# Patient Record
Sex: Female | Born: 1935
Health system: Southern US, Community
[De-identification: ages and names within clinical notes are randomized; demographics above are authoritative.]

## PROBLEM LIST (undated history)

## (undated) DIAGNOSIS — I10 Essential (primary) hypertension: Secondary | ICD-10-CM

## (undated) DIAGNOSIS — M1990 Unspecified osteoarthritis, unspecified site: Secondary | ICD-10-CM

## (undated) DIAGNOSIS — G609 Hereditary and idiopathic neuropathy, unspecified: Secondary | ICD-10-CM

## (undated) DIAGNOSIS — R7303 Prediabetes: Secondary | ICD-10-CM

## (undated) DIAGNOSIS — E785 Hyperlipidemia, unspecified: Secondary | ICD-10-CM

## (undated) DIAGNOSIS — K589 Irritable bowel syndrome without diarrhea: Secondary | ICD-10-CM

## (undated) DIAGNOSIS — R253 Fasciculation: Secondary | ICD-10-CM

## (undated) DIAGNOSIS — K219 Gastro-esophageal reflux disease without esophagitis: Secondary | ICD-10-CM

## (undated) DIAGNOSIS — K648 Other hemorrhoids: Secondary | ICD-10-CM

## (undated) HISTORY — PX: OTHER SURGICAL HISTORY: SHX169

## (undated) HISTORY — DX: Fasciculation: R25.3

## (undated) HISTORY — DX: Gastro-esophageal reflux disease without esophagitis: K21.9

## (undated) HISTORY — DX: Hyperlipidemia, unspecified: E78.5

## (undated) HISTORY — PX: COLONOSCOPY: SHX174

## (undated) HISTORY — DX: Essential (primary) hypertension: I10

## (undated) HISTORY — DX: Hereditary and idiopathic neuropathy, unspecified: G60.9

## (undated) HISTORY — DX: Irritable bowel syndrome, unspecified: K58.9

## (undated) HISTORY — DX: Prediabetes: R73.03

## (undated) HISTORY — DX: Unspecified osteoarthritis, unspecified site: M19.90

## (undated) HISTORY — PX: KNEE ARTHROSCOPY: SUR90

## (undated) HISTORY — DX: Other hemorrhoids: K64.8

---

## 2000-02-28 ENCOUNTER — Inpatient Hospital Stay (HOSPITAL_COMMUNITY): Admission: EM | Admit: 2000-02-28 | Discharge: 2000-03-02 | Payer: Self-pay | Admitting: Cardiology

## 2000-02-28 ENCOUNTER — Encounter: Payer: Self-pay | Admitting: Cardiology

## 2001-05-15 ENCOUNTER — Encounter: Payer: Self-pay | Admitting: Family Medicine

## 2001-05-15 ENCOUNTER — Ambulatory Visit (HOSPITAL_COMMUNITY): Admission: RE | Admit: 2001-05-15 | Discharge: 2001-05-15 | Payer: Self-pay | Admitting: Family Medicine

## 2001-06-20 ENCOUNTER — Ambulatory Visit (HOSPITAL_COMMUNITY): Admission: RE | Admit: 2001-06-20 | Discharge: 2001-06-20 | Payer: Self-pay | Admitting: Family Medicine

## 2001-06-20 ENCOUNTER — Encounter: Payer: Self-pay | Admitting: Family Medicine

## 2001-11-27 ENCOUNTER — Other Ambulatory Visit: Admission: RE | Admit: 2001-11-27 | Discharge: 2001-11-27 | Payer: Self-pay | Admitting: Family Medicine

## 2002-07-03 ENCOUNTER — Encounter: Payer: Self-pay | Admitting: Family Medicine

## 2002-07-03 ENCOUNTER — Ambulatory Visit (HOSPITAL_COMMUNITY): Admission: RE | Admit: 2002-07-03 | Discharge: 2002-07-03 | Payer: Self-pay | Admitting: Family Medicine

## 2002-09-20 HISTORY — PX: COLONOSCOPY: SHX174

## 2003-02-05 ENCOUNTER — Encounter: Payer: Self-pay | Admitting: Family Medicine

## 2003-02-05 ENCOUNTER — Ambulatory Visit (HOSPITAL_COMMUNITY): Admission: RE | Admit: 2003-02-05 | Discharge: 2003-02-05 | Payer: Self-pay | Admitting: Family Medicine

## 2003-03-28 ENCOUNTER — Emergency Department (HOSPITAL_COMMUNITY): Admission: EM | Admit: 2003-03-28 | Discharge: 2003-03-28 | Payer: Self-pay | Admitting: Emergency Medicine

## 2003-04-09 ENCOUNTER — Ambulatory Visit (HOSPITAL_COMMUNITY): Admission: RE | Admit: 2003-04-09 | Discharge: 2003-04-09 | Payer: Self-pay | Admitting: Internal Medicine

## 2003-04-14 ENCOUNTER — Inpatient Hospital Stay (HOSPITAL_COMMUNITY): Admission: EM | Admit: 2003-04-14 | Discharge: 2003-04-16 | Payer: Self-pay | Admitting: Emergency Medicine

## 2003-05-01 ENCOUNTER — Other Ambulatory Visit: Admission: RE | Admit: 2003-05-01 | Discharge: 2003-05-01 | Payer: Self-pay | Admitting: Obstetrics and Gynecology

## 2003-07-09 ENCOUNTER — Ambulatory Visit (HOSPITAL_COMMUNITY): Admission: RE | Admit: 2003-07-09 | Discharge: 2003-07-09 | Payer: Self-pay | Admitting: Family Medicine

## 2003-07-09 ENCOUNTER — Encounter: Payer: Self-pay | Admitting: Family Medicine

## 2003-11-28 ENCOUNTER — Encounter: Admission: RE | Admit: 2003-11-28 | Discharge: 2003-11-28 | Payer: Self-pay | Admitting: Family Medicine

## 2004-03-04 ENCOUNTER — Ambulatory Visit (HOSPITAL_COMMUNITY): Admission: RE | Admit: 2004-03-04 | Discharge: 2004-03-04 | Payer: Self-pay | Admitting: Orthopedic Surgery

## 2004-03-10 ENCOUNTER — Encounter (HOSPITAL_COMMUNITY): Admission: RE | Admit: 2004-03-10 | Discharge: 2004-04-09 | Payer: Self-pay | Admitting: Orthopedic Surgery

## 2004-04-10 ENCOUNTER — Ambulatory Visit (HOSPITAL_COMMUNITY): Admission: RE | Admit: 2004-04-10 | Discharge: 2004-04-10 | Payer: Self-pay | Admitting: Orthopedic Surgery

## 2004-07-14 ENCOUNTER — Ambulatory Visit (HOSPITAL_COMMUNITY): Admission: RE | Admit: 2004-07-14 | Discharge: 2004-07-14 | Payer: Self-pay | Admitting: Family Medicine

## 2004-09-20 HISTORY — PX: ESOPHAGOGASTRODUODENOSCOPY: SHX1529

## 2004-11-06 ENCOUNTER — Encounter: Admission: RE | Admit: 2004-11-06 | Discharge: 2004-11-06 | Payer: Self-pay | Admitting: Family Medicine

## 2004-12-16 ENCOUNTER — Ambulatory Visit: Payer: Self-pay | Admitting: Orthopedic Surgery

## 2005-02-01 ENCOUNTER — Ambulatory Visit: Payer: Self-pay | Admitting: Internal Medicine

## 2005-04-28 ENCOUNTER — Ambulatory Visit: Payer: Self-pay | Admitting: Internal Medicine

## 2005-07-16 ENCOUNTER — Ambulatory Visit (HOSPITAL_COMMUNITY): Admission: RE | Admit: 2005-07-16 | Discharge: 2005-07-16 | Payer: Self-pay | Admitting: Family Medicine

## 2005-08-04 ENCOUNTER — Ambulatory Visit (HOSPITAL_COMMUNITY): Admission: RE | Admit: 2005-08-04 | Discharge: 2005-08-04 | Payer: Self-pay | Admitting: Family Medicine

## 2005-08-18 ENCOUNTER — Ambulatory Visit: Payer: Self-pay | Admitting: Internal Medicine

## 2005-08-26 ENCOUNTER — Ambulatory Visit: Payer: Self-pay | Admitting: Internal Medicine

## 2005-08-26 ENCOUNTER — Ambulatory Visit (HOSPITAL_COMMUNITY): Admission: RE | Admit: 2005-08-26 | Discharge: 2005-08-26 | Payer: Self-pay | Admitting: Internal Medicine

## 2005-09-30 ENCOUNTER — Ambulatory Visit: Payer: Self-pay | Admitting: Internal Medicine

## 2006-03-17 ENCOUNTER — Ambulatory Visit: Payer: Self-pay | Admitting: Internal Medicine

## 2006-06-27 ENCOUNTER — Ambulatory Visit: Payer: Self-pay | Admitting: Gastroenterology

## 2006-07-18 ENCOUNTER — Encounter: Admission: RE | Admit: 2006-07-18 | Discharge: 2006-07-18 | Payer: Self-pay | Admitting: Family Medicine

## 2006-08-09 ENCOUNTER — Ambulatory Visit (HOSPITAL_COMMUNITY): Admission: RE | Admit: 2006-08-09 | Discharge: 2006-08-09 | Payer: Self-pay | Admitting: Family Medicine

## 2006-08-30 ENCOUNTER — Ambulatory Visit: Payer: Self-pay | Admitting: Gastroenterology

## 2006-12-21 ENCOUNTER — Ambulatory Visit: Payer: Self-pay | Admitting: Gastroenterology

## 2007-01-02 ENCOUNTER — Encounter: Admission: RE | Admit: 2007-01-02 | Discharge: 2007-01-02 | Payer: Self-pay | Admitting: Family Medicine

## 2007-03-16 ENCOUNTER — Ambulatory Visit: Payer: Self-pay | Admitting: Gastroenterology

## 2007-05-24 ENCOUNTER — Ambulatory Visit (HOSPITAL_COMMUNITY): Admission: RE | Admit: 2007-05-24 | Discharge: 2007-05-24 | Payer: Self-pay | Admitting: Family Medicine

## 2007-06-05 ENCOUNTER — Other Ambulatory Visit: Admission: RE | Admit: 2007-06-05 | Discharge: 2007-06-05 | Payer: Self-pay | Admitting: Obstetrics & Gynecology

## 2007-08-11 ENCOUNTER — Ambulatory Visit (HOSPITAL_COMMUNITY): Admission: RE | Admit: 2007-08-11 | Discharge: 2007-08-11 | Payer: Self-pay | Admitting: Family Medicine

## 2007-10-05 ENCOUNTER — Ambulatory Visit: Payer: Self-pay | Admitting: Gastroenterology

## 2007-10-09 ENCOUNTER — Ambulatory Visit (HOSPITAL_COMMUNITY): Admission: RE | Admit: 2007-10-09 | Discharge: 2007-10-09 | Payer: Self-pay | Admitting: Gastroenterology

## 2007-10-24 ENCOUNTER — Ambulatory Visit (HOSPITAL_COMMUNITY): Admission: RE | Admit: 2007-10-24 | Discharge: 2007-10-24 | Payer: Self-pay | Admitting: Obstetrics and Gynecology

## 2008-01-10 ENCOUNTER — Ambulatory Visit (HOSPITAL_COMMUNITY): Admission: RE | Admit: 2008-01-10 | Discharge: 2008-01-10 | Payer: Self-pay | Admitting: Family Medicine

## 2008-02-05 ENCOUNTER — Ambulatory Visit: Payer: Self-pay | Admitting: Gastroenterology

## 2008-03-27 ENCOUNTER — Ambulatory Visit (HOSPITAL_COMMUNITY): Admission: RE | Admit: 2008-03-27 | Discharge: 2008-03-27 | Payer: Self-pay | Admitting: Urology

## 2008-05-21 DIAGNOSIS — R253 Fasciculation: Secondary | ICD-10-CM

## 2008-05-21 HISTORY — DX: Fasciculation: R25.3

## 2008-05-23 ENCOUNTER — Ambulatory Visit: Payer: Self-pay | Admitting: Gastroenterology

## 2008-05-29 ENCOUNTER — Ambulatory Visit (HOSPITAL_COMMUNITY): Admission: RE | Admit: 2008-05-29 | Discharge: 2008-05-29 | Payer: Self-pay | Admitting: Gastroenterology

## 2008-07-12 ENCOUNTER — Other Ambulatory Visit: Admission: RE | Admit: 2008-07-12 | Discharge: 2008-07-12 | Payer: Self-pay | Admitting: Obstetrics and Gynecology

## 2008-07-16 ENCOUNTER — Ambulatory Visit (HOSPITAL_COMMUNITY): Admission: RE | Admit: 2008-07-16 | Discharge: 2008-07-16 | Payer: Self-pay | Admitting: Obstetrics and Gynecology

## 2008-08-12 ENCOUNTER — Ambulatory Visit (HOSPITAL_COMMUNITY): Admission: RE | Admit: 2008-08-12 | Discharge: 2008-08-12 | Payer: Self-pay | Admitting: Family Medicine

## 2008-08-13 ENCOUNTER — Ambulatory Visit: Payer: Self-pay | Admitting: Gastroenterology

## 2008-09-24 ENCOUNTER — Ambulatory Visit (HOSPITAL_COMMUNITY): Admission: RE | Admit: 2008-09-24 | Discharge: 2008-09-24 | Payer: Self-pay | Admitting: Urology

## 2008-11-08 ENCOUNTER — Encounter: Payer: Self-pay | Admitting: Obstetrics and Gynecology

## 2008-11-08 ENCOUNTER — Ambulatory Visit (HOSPITAL_COMMUNITY): Admission: RE | Admit: 2008-11-08 | Discharge: 2008-11-08 | Payer: Self-pay | Admitting: Obstetrics and Gynecology

## 2008-12-04 ENCOUNTER — Ambulatory Visit: Payer: Self-pay | Admitting: Gastroenterology

## 2008-12-04 DIAGNOSIS — K589 Irritable bowel syndrome without diarrhea: Secondary | ICD-10-CM

## 2008-12-04 DIAGNOSIS — K219 Gastro-esophageal reflux disease without esophagitis: Secondary | ICD-10-CM

## 2008-12-04 DIAGNOSIS — R1319 Other dysphagia: Secondary | ICD-10-CM

## 2008-12-04 DIAGNOSIS — R109 Unspecified abdominal pain: Secondary | ICD-10-CM | POA: Insufficient documentation

## 2008-12-04 DIAGNOSIS — R1084 Generalized abdominal pain: Secondary | ICD-10-CM

## 2009-06-20 ENCOUNTER — Encounter: Payer: Self-pay | Admitting: Gastroenterology

## 2009-07-01 DIAGNOSIS — E785 Hyperlipidemia, unspecified: Secondary | ICD-10-CM | POA: Insufficient documentation

## 2009-07-01 DIAGNOSIS — I10 Essential (primary) hypertension: Secondary | ICD-10-CM | POA: Insufficient documentation

## 2009-07-02 ENCOUNTER — Ambulatory Visit: Payer: Self-pay | Admitting: Gastroenterology

## 2009-08-13 ENCOUNTER — Ambulatory Visit (HOSPITAL_COMMUNITY): Admission: RE | Admit: 2009-08-13 | Discharge: 2009-08-13 | Payer: Self-pay | Admitting: Family Medicine

## 2009-08-27 ENCOUNTER — Ambulatory Visit (HOSPITAL_COMMUNITY): Admission: RE | Admit: 2009-08-27 | Discharge: 2009-08-27 | Payer: Self-pay | Admitting: Family Medicine

## 2009-11-12 ENCOUNTER — Other Ambulatory Visit: Admission: RE | Admit: 2009-11-12 | Discharge: 2009-11-12 | Payer: Self-pay | Admitting: Obstetrics and Gynecology

## 2009-12-24 ENCOUNTER — Ambulatory Visit: Payer: Self-pay | Admitting: Gastroenterology

## 2010-02-18 ENCOUNTER — Emergency Department (HOSPITAL_COMMUNITY): Admission: EM | Admit: 2010-02-18 | Discharge: 2010-02-18 | Payer: Self-pay | Admitting: Emergency Medicine

## 2010-06-02 ENCOUNTER — Ambulatory Visit: Payer: Self-pay | Admitting: Gastroenterology

## 2010-06-02 DIAGNOSIS — R634 Abnormal weight loss: Secondary | ICD-10-CM | POA: Insufficient documentation

## 2010-06-03 ENCOUNTER — Encounter: Payer: Self-pay | Admitting: Gastroenterology

## 2010-06-03 LAB — CONVERTED CEMR LAB
Albumin: 3.9 g/dL (ref 3.5–5.2)
BUN: 14 mg/dL (ref 6–23)
CO2: 30 meq/L (ref 19–32)
Calcium: 9.3 mg/dL (ref 8.4–10.5)
Chloride: 97 meq/L (ref 96–112)
Creatinine, Ser: 1.1 mg/dL (ref 0.40–1.20)
Hemoglobin: 12.1 g/dL (ref 12.0–15.0)
Lymphocytes Relative: 37 % (ref 12–46)
Lymphs Abs: 3.5 10*3/uL (ref 0.7–4.0)
Monocytes Absolute: 0.7 10*3/uL (ref 0.1–1.0)
Monocytes Relative: 7 % (ref 3–12)
Neutro Abs: 5.1 10*3/uL (ref 1.7–7.7)
RBC: 5.28 M/uL — ABNORMAL HIGH (ref 3.87–5.11)
TSH: 2.544 microintl units/mL (ref 0.350–4.500)
WBC: 9.5 10*3/uL (ref 4.0–10.5)

## 2010-06-04 ENCOUNTER — Encounter: Payer: Self-pay | Admitting: Gastroenterology

## 2010-06-11 ENCOUNTER — Ambulatory Visit: Payer: Self-pay | Admitting: Gastroenterology

## 2010-06-17 ENCOUNTER — Encounter (INDEPENDENT_AMBULATORY_CARE_PROVIDER_SITE_OTHER): Payer: Self-pay | Admitting: *Deleted

## 2010-06-20 DIAGNOSIS — K648 Other hemorrhoids: Secondary | ICD-10-CM

## 2010-06-20 HISTORY — DX: Other hemorrhoids: K64.8

## 2010-06-22 ENCOUNTER — Encounter: Payer: Self-pay | Admitting: Gastroenterology

## 2010-07-03 ENCOUNTER — Ambulatory Visit: Payer: Self-pay | Admitting: Gastroenterology

## 2010-07-03 ENCOUNTER — Ambulatory Visit (HOSPITAL_COMMUNITY): Admission: RE | Admit: 2010-07-03 | Discharge: 2010-07-03 | Payer: Self-pay | Admitting: Gastroenterology

## 2010-08-07 ENCOUNTER — Encounter (INDEPENDENT_AMBULATORY_CARE_PROVIDER_SITE_OTHER): Payer: Self-pay

## 2010-08-14 ENCOUNTER — Ambulatory Visit (HOSPITAL_COMMUNITY)
Admission: RE | Admit: 2010-08-14 | Discharge: 2010-08-14 | Payer: Self-pay | Source: Home / Self Care | Admitting: Family Medicine

## 2010-09-02 ENCOUNTER — Encounter: Payer: Self-pay | Admitting: Gastroenterology

## 2010-09-03 ENCOUNTER — Encounter: Payer: Self-pay | Admitting: Gastroenterology

## 2010-09-03 DIAGNOSIS — D509 Iron deficiency anemia, unspecified: Secondary | ICD-10-CM

## 2010-09-03 LAB — CONVERTED CEMR LAB
Basophils Relative: 0 % (ref 0–1)
Ferritin: 119 ng/mL (ref 10–291)
Lymphs Abs: 2.7 10*3/uL (ref 0.7–4.0)
MCHC: 31.9 g/dL (ref 30.0–36.0)
Monocytes Relative: 7 % (ref 3–12)
Neutro Abs: 6.7 10*3/uL (ref 1.7–7.7)
Neutrophils Relative %: 65 % (ref 43–77)
Platelets: 244 10*3/uL (ref 150–400)
RBC: 5.04 M/uL (ref 3.87–5.11)
WBC: 10.4 10*3/uL (ref 4.0–10.5)

## 2010-09-04 ENCOUNTER — Encounter (INDEPENDENT_AMBULATORY_CARE_PROVIDER_SITE_OTHER): Payer: Self-pay | Admitting: *Deleted

## 2010-09-16 ENCOUNTER — Ambulatory Visit (HOSPITAL_COMMUNITY)
Admission: RE | Admit: 2010-09-16 | Discharge: 2010-09-16 | Payer: Self-pay | Source: Home / Self Care | Attending: Family Medicine | Admitting: Family Medicine

## 2010-09-22 ENCOUNTER — Ambulatory Visit
Admission: RE | Admit: 2010-09-22 | Discharge: 2010-09-22 | Payer: Self-pay | Source: Home / Self Care | Attending: Gastroenterology | Admitting: Gastroenterology

## 2010-10-22 NOTE — Letter (Signed)
Summary: Recall Office Visit  [facility]  [address]   [place], [place] [address]   Phone: [phone]  Fax: [phone]      September 04, 2010   Katherine Joseph [address] [name] [address] [name], [place]  [address] [birth year]   Dear [name],   According to our records, it is time for you to schedule a follow-up office visit with [place].   At your convenience, please call [phone] to schedule an office visit. If you have any questions, concerns, or feel that this letter is in error, we would appreciate your call.   Sincerely,    Rexene Alberts  [facility] Ph: [phone]   Fax: [phone]

## 2010-10-22 NOTE — Miscellaneous (Signed)
Summary: Orders Update  Clinical Lists Changes  Orders: Added new Test order of T-CBC w/Diff ([id number]) - Signed Added new Test order of T-Ferritin ([id number]) - Signed 

## 2010-10-22 NOTE — Assessment & Plan Note (Signed)
Summary: 2 MON F/U POST PROCEDUE/NAUSEA/LAW   Visit Type:  Follow-up Visit Primary Care Provider:  Sudie Bailey, M.D.  CC:  follow up- doing ok.  History of Present Illness: Pt here for routine follow-up after EGD and colonoscopy in October 2011 done secondary to heme + stools, has hx of IBS .Marland Kitchen Has been on omeprazole twice a day since October. see findings below. Following high fiber diet. No problems with constipation. Denies brbpr or melena. Would like to decrease omeprazole to once/day. Denies abdominal pain, N/V. Last CBC was in December, next due in 6 mos. Pt has no complaints today, doing well. [id number] was 185, today 181.   July 04, 2010:Normal terminal ileum, approximately 10 cm visualized. 2. Rare descending and sigmoid colon diverticula.  Otherwise, no     polyps, masses, inflammatory changes, or AVMs seen. 3. Small internal hemorrhoids.  Otherwise, normal retroflex view of     the rectum. 4. Normal esophagus without evidence of Barrett's mass, erosion,     ulceration, or stricture. 5. Patchy erythema with occasional erosions seen in the stomach.     Biopsies obtained via cold forceps to allow for H. pylori     gastritis. 6. Multiple small duodenal ulcers.  Current Medications (verified): 1)  Lisinopril-Hydrochlorothiazide 20-12.5 Mg Tabs (Lisinopril-Hydrochlorothiazide) .... Once Daily 2)  Lipitor 20 Mg Tabs (Atorvastatin Calcium) .... Once Daily 3)  Asa 81 Mg .... Once Daily 4)  Depakote 250 Mg Tbec (Divalproex Sodium) .... Three At Bedtime 5)  Gnp Omeprazole 20 Mg Tbec (Omeprazole) .... Once Daily 6)  Klor-Con M20 20 Meq Cr-Tabs (Potassium Chloride Crys Cr) .... Once Daily 7)  Metoprolol Tartrate 25 Mg Tabs (Metoprolol Tartrate) .... 1/2 Tablet Twice Daily 8)  Torsemide 100 Mg Tabs (Torsemide) .... 1/2 Tablet Daily 9)  Chlordiazepoxide Hcl 5 Mg Caps (Chlordiazepoxide Hcl) .... Once Daily 10)  Prednisone 5 Mg Tabs (Prednisone) .... Take As Directed  Allergies  (verified): 1)  ! * Ivp Dye  Past History:  Past Medical History: GERD Hyperlipidemia Hypertension Irritable Bowel Syndrome "FLUTTERING IN THROAT"-SEP 2009 MBS/BASW: no OPD, nl esophageal motility  EGD/DILATION 2006- 56 Fr Maloney (RMR) ALLERGY TO IVP DYE EGDx2/TCSx2(RMR, NUR) 2004: Inflammatory polyp complicated by postpolypectomy bleed  July 04, 2010:Normal terminal ileum, approximately 10 cm visualized. 2. Rare descending and sigmoid colon diverticula.  Otherwise, no     polyps, masses, inflammatory changes, or AVMs seen. 3. Small internal hemorrhoids.  Otherwise, normal retroflex view of     the rectum. 4. Normal esophagus without evidence of Barrett's mass, erosion,     ulceration, or stricture. 5. Patchy erythema with occasional erosions seen in the stomach.     Biopsies obtained via cold forceps to allow for H. pylori     gastritis. 6. Multiple small duodenal ulcers.  Review of Systems General:  Denies fever, chills, and anorexia. Eyes:  Denies blurring, irritation, and discharge. ENT:  Denies sore throat, hoarseness, and difficulty swallowing. CV:  Denies chest pains, angina, and syncope. Resp:  Denies dyspnea at rest and wheezing. GI:  Denies difficulty swallowing, pain on swallowing, nausea, indigestion/heartburn, abdominal pain, constipation, change in bowel habits, bloody BM's, and black BMs. GU:  Denies urinary burning and urinary frequency. MS:  Denies joint pain / LOM, joint swelling, and joint stiffness. Derm:  Denies rash, itching, and dry skin. Neuro:  Denies weakness and syncope. Psych:  Denies depression and anxiety. Endo:  Denies cold intolerance and heat intolerance. Heme:  Denies bruising, bleeding, and enlarged lymph nodes.  Vital Signs:  Patient profile:   76 year old female Height:      69 inches Weight:      181 pounds BMI:     26.83 Temp:     98.7 degrees F oral Pulse rate:   80 / minute BP sitting:   122 / 78  (left arm) Cuff size:    regular  Vitals Entered By: Hendricks Limes LPN (September 22, 2010 1:43 PM)  Physical Exam  General:  Well developed, well nourished, no acute distress. Head:  Normocephalic and atraumatic. Eyes:  sclera without icterus, conjuctiva clear Mouth:  No deformity or lesions, dentition normal. Lungs:  Clear throughout to auscultation. Heart:  Regular rate and rhythm; no murmurs, rubs,  or bruits. Abdomen:  normal bowel sounds, without guarding, without rebound, no distesion, no tenderness, no masses, and no hepatomegally or splenomegaly.   Msk:  Symmetrical with no gross deformities. Normal posture. Pulses:  Normal pulses noted. Extremities:  No clubbing, cyanosis, edema or deformities noted. Neurologic:  Alert and  oriented x4;  grossly normal neurologically. Skin:  Intact without significant lesions or rashes. Psych:  Alert and cooperative. Normal mood and affect.  Impression & Recommendations:  Problem # 1:  ABDOMINAL PAIN, GENERALIZED (ICD-59.45)  76 year old female with hx of IBS, GERD, recent EGD/TCS in Oct 2011 showing small internal hemorrhoids, gastritis, duodenitis. Has been on twice daily PPI since October. Would like to decrease to once daily. Asymptomatic currently, no dysphagia or odynophagia. wt 181, in sept was 185.   May take omeprazole once daily Return in 3 mos for reassessment, wt check CBC in 6 mos to reassess for anemia.  Orders: Est. Patient Level II ([address])  Appended Document: 2 MON F/U POST PROCEDUE/NAUSEA/LAW F/U OPV AN LABS ARE IN THE COMPUTER

## 2010-10-22 NOTE — Assessment & Plan Note (Signed)
Summary: POS FIOBT   Pt returned one iFOBT and it was positive.  Tried to call pt. Ph#: busy. Her stool tests was pos for blood. She needs a repeat TCS. West Bali MD  June 12, 2010 1:30 PM     Allergies: 1)  ! * Ivp Dye  Other Orders: Immuno-chemical Fecal Occult ([id number])  Appended Document: POS FIOBT Called pt. Pos blood detected in stool. TCS/?EGD on OCT 4, RE: heme positive stools, TRILYTE PREP.  Appended Document: POS FIOBT I called pt line was busy.Marland KitchenMarland KitchenI will call back tomorrow.  Appended Document: POS FIOBT I called pt to schedule procedure,lin still busy..I will mail letter.  Appended Document: POS FIOBT Pt called today to schedule her procedure..She is scheduled for 07/03/10@10 :45am..Instructions mailed.

## 2010-10-22 NOTE — Letter (Signed)
Summary: TCS POSS EGD ORDER  TCS POSS EGD ORDER   Imported By: Ave Filter 06/22/2010 10:33:12  _____________________________________________________________________  External Attachment:    Type:   Image     Comment:   External Document

## 2010-10-22 NOTE — Miscellaneous (Signed)
Summary: Orders Update  Clinical Lists Changes  Problems: Added new problem of UNSPECIFIED ANEMIA (ICD-285.9) Orders: Added new Test order of T-CBC w/Diff ([id number]) - Signed 

## 2010-10-22 NOTE — Letter (Signed)
Summary: Recall, Screening Colonoscopy Only  [facility]  [address]   [place], [place] [address]   Phone: [phone]  Fax: [phone]    June 17, 2010  KIMBRELY BUCKEL [address] [name] [address] [place], [place]  [address] [birth year]   Dear Ms. Coughlin,   Our records indicate it is time to schedule your colonoscopy.  However, our office has been unable to contact you by phone.   Please call our office at [phone] and ask for [facility].   Thank you,   Ave Filter   [facility] Ph: [phone]   Fax: [phone]

## 2010-10-22 NOTE — Letter (Signed)
Summary: Recall, Labs Needed  [facility]  [address]   [place], [place] [address]   Phone: [phone]  Fax: [phone]    August 07, 2010  LAYCE SPRUNG [address] [name] [address] [place], [place]  [address] Jul 24, 1935   Dear Ms. Hagan,   Our records indicate it is time to repeat your blood work.  You can take the enclosed form to the lab on or near the date indicated.  Please make note of the new location of the lab:   [address], 2nd floor   [facility]  Our office will call you within a week to ten business days with the results.  If you do not hear from [place] in 10 business days, you should call the office.  If you have any questions regarding this, call the office at [phone], and ask for the nurse.  Labs are due on 09/02/2010.   Sincerely,    Hendricks Limes LPN  [facility] Ph: [phone]   Fax: [phone]

## 2010-10-22 NOTE — Assessment & Plan Note (Signed)
Summary: IBS, WEIGHT LOSS   Visit Type:  Follow-up Visit Primary Care Provider:  Sudie Bailey, M.D.  Chief Complaint:  F/U weight loss/diarrhea.  History of Present Illness: Sometimes diarrhea and feels nauseous: none lately. Last time: AUG 2011 when watching grandchildren. Didnt feel bad but kept running to BR. No vomiting. Sometimes feels like she can't get enough to eat. Concerned because she lost 15 lbs. Milk: 1/2 glass milk, and with cereal every AM. ice Cream: no. Cheese: never unless it's on hamburger.  No BC, or Goody's. ASA 81 mg daily. No Ibuprofen or Aleve. Use hydrocodone for joint pain. Taking ALign but stopped because they are expensive and usu. #30 and she ran out. Does drink [employer]. NO PAIN. No problem w/ swallowing, heartburn/indigestion, blood in stool, or melena. U Current Medications (verified): 1)  Lisinopril-Hydrochlorothiazide 20-12.5 Mg Tabs (Lisinopril-Hydrochlorothiazide) .... Once Daily 2)  Lipitor 20 Mg Tabs (Atorvastatin Calcium) .... Once Daily 3)  Asa 81 Mg .... Once Daily 4)  Depakote 250 Mg Tbec (Divalproex Sodium) .... Three At Bedtime 5)  Gnp Omeprazole 20 Mg Tbec (Omeprazole) .... Once Daily 6)  Klor-Con M20 20 Meq Cr-Tabs (Potassium Chloride Crys Cr) .... Once Daily 7)  Metoprolol Tartrate 25 Mg Tabs (Metoprolol Tartrate) .... 1/2 Tablet Twice Daily 8)  Torsemide 100 Mg Tabs (Torsemide) .... 1/2 Tablet Daily 9)  Chlordiazepoxide Hcl 5 Mg Caps (Chlordiazepoxide Hcl) .... Once Daily  Allergies (verified): 1)  ! * Ivp Dye  Past History:  Past Medical History: Last updated: 12/24/2009 GERD Hyperlipidemia Hypertension Irritable Bowel Syndrome "FLUTTERING IN THROAT"-SEP 2009 MBS/BASW: no OPD, nl esophageal motility  EGD/DILATION 2006- 56 Fr Maloney (RMR) ALLERGY TO IVP DYE EGDx2/TCSx2(RMR, NUR) 2004: Inflammatory polyp complicated by postpolypectomy bleed  Past Surgical History: Last updated: 07/01/2009 RIGHT FOOT SURGERY  Review of Systems      JUNE 2011: CT AP w/ ivc-hepatic cysts  Vital Signs:  Patient profile:   76 year old female Height:      69 inches Weight:      185 pounds BMI:     27.42 Temp:     98.1 degrees F oral Pulse rate:   88 / minute BP sitting:   130 / 80  (left arm) Cuff size:   regular  Vitals Entered By: Cloria Spring LPN (June 02, 2010 3:59 PM)  Physical Exam  General:  Well developed, well nourished, no acute distress. Head:  Normocephalic and atraumatic. Eyes:  PERRL, no icterus. Mouth:  No deformity or lesions. Neck:  Supple; no masses. Lungs:  Clear throughout to auscultation. Heart:  Regular rate and rhythm; no murmurs. Abdomen:  Soft, nontender and nondistended. No masses, BRUITS, or hernias noted. Normal bowel sounds. Extremities:  BIL trace pedal edema.   Neurologic:  Alert and  oriented x4;  grossly normal neurologically.  Impression & Recommendations:  Problem # 1:  IBS (ICD-564.1) With intermittent diarrhea and nausea. Restart probiotics: Align or Phillip's Colon Health daily.Marland Kitchen CHECK THYROID, PROTEIN, AND BLOOD COUNT TODAY. Avoid dairy. See HO. Take Calcium 500-600 mg daily. Follow up in 2 mos.  Orders: T-Comprehensive Metabolic Panel [id number]) T-TSH [phone]) T-CBC w/Diff [phone])  Problem # 2:  WEIGHT LOSS (ICD-783.21)  Unexplained weight loss. No overt GI Sx elicited. No idication for endoscopy at this time. CHECK THYROID, PROTEIN, AND BLOOD COUNT TODAY. Appt w/ PCP SEP 26. Follow up in 2 mos.  CC: PCP  Orders: T-Comprehensive Metabolic Panel [id number]) T-TSH [id number]) T-CBC w/Diff [id number])  Patient Instructions: 1)  CHECK THYROID, PROTEIN,  AND BLOOD COUNT TODAY. 2)  Continue Align or Phillip's Colon Health daily. 3)  Avoid dairy. See HO. 4)  Take Calcium 500-600 mg daily. 5)  Follow up in 2 mos. 6)  The medication list was reviewed and reconciled.  All changed / newly prescribed medications were explained.  A complete medication list  was provided to the patient / caregiver.  Appended Document: IBS, WEIGHT LOSS 2 MONTH F/U OPV IS IN THE COMPUTER  Appended Document: Orders Update    Clinical Lists Changes  Orders: Added new Service order of Est. Patient Level V ([address]) - Signed

## 2010-10-22 NOTE — Assessment & Plan Note (Signed)
Summary: IBS,GERD   Visit Type:  Follow-up Visit Primary Care Provider:  Sudie Bailey, M.D.   Chief Complaint:  follow up and doing ok.  History of Present Illness: No question, concerns, or complaints. Doing well. Align is expensive and so s/t doesn't get them. BMs: daily. Drinks [employer] every AM.  Current Medications (verified): 1)  Lisinopril-Hydrochlorothiazide 20-12.5 Mg Tabs (Lisinopril-Hydrochlorothiazide) .... Once Daily 2)  Lipitor 20 Mg Tabs (Atorvastatin Calcium) .... Once Daily 3)  Asa 81 Mg .... Once Daily 4)  Depakote 250 Mg Tbec (Divalproex Sodium) .... Three At Bedtime 5)  Gnp Omeprazole 20 Mg Tbec (Omeprazole) .... Once Daily 6)  Klor-Con M20 20 Meq Cr-Tabs (Potassium Chloride Crys Cr) .... Once Daily 7)  Metoprolol Tartrate 25 Mg Tabs (Metoprolol Tartrate) .... Two Times A Day 8)  Torsemide 100 Mg Tabs (Torsemide) .... 1/2 Tablet Daily 9)  Chlordiazepoxide Hcl 5 Mg Caps (Chlordiazepoxide Hcl) .... Once Daily  Allergies (verified): 1)  ! * Ivp Dye  Past History:  Past Medical History: GERD Hyperlipidemia Hypertension Irritable Bowel Syndrome "FLUTTERING IN THROAT"-SEP 2009 MBS/BASW: no OPD, nl esophageal motility  EGD/DILATION 2006- 56 Fr Maloney (RMR) ALLERGY TO IVP DYE EGDx2/TCSx2(RMR, NUR) 2004: Inflammatory polyp complicated by postpolypectomy bleed  Vital Signs:  Patient profile:   76 year old female Height:      69 inches Weight:      198 pounds BMI:     29.35 Temp:     98.5 degrees F oral Pulse rate:   80 / minute BP sitting:   128 / 82  (left arm) Cuff size:   regular  Vitals Entered By: Hendricks Limes LPN (December 24, 9809 10:03 AM)  Physical Exam  General:  Well developed, well nourished, no acute distress. Head:  Normocephalic and atraumatic. Mouth:  No deformity or lesions, dentition poorl. Lungs:  Clear throughout to auscultation. Heart:  Regular rate and rhythm; no murmurs. Abdomen:  Soft, nontender and nondistended.  Normal bowel  sounds. Extremities:  trace pedal edema BIL.   Neurologic:  Alert and  oriented x4;  grossly normal neurologically.  Impression & Recommendations:  Problem # 1:  IBS (ICD-564.1) Assessment Improved  Unable to afford probiotics. Given samples: Restora #4, DA-IBS #32, and Align couponsx2. OPV in 6 mos.  CC: PCP  Orders: Est. Patient Level II ([address])  Problem # 2:  GERD (ICD-530.81) Assessment: Comment Only Continue Prilosec.

## 2010-11-16 ENCOUNTER — Other Ambulatory Visit: Payer: Self-pay | Admitting: Neurology

## 2010-11-16 DIAGNOSIS — I639 Cerebral infarction, unspecified: Secondary | ICD-10-CM

## 2010-11-16 DIAGNOSIS — R200 Anesthesia of skin: Secondary | ICD-10-CM

## 2010-11-17 ENCOUNTER — Ambulatory Visit (HOSPITAL_COMMUNITY)
Admission: RE | Admit: 2010-11-17 | Discharge: 2010-11-17 | Disposition: A | Discharge status: Home / Self Care | Payer: No Typology Code available for payment source | Source: Ambulatory Visit | Attending: Neurology | Admitting: Neurology

## 2010-11-17 DIAGNOSIS — R200 Anesthesia of skin: Secondary | ICD-10-CM

## 2010-11-17 DIAGNOSIS — I639 Cerebral infarction, unspecified: Secondary | ICD-10-CM

## 2010-11-17 DIAGNOSIS — I6529 Occlusion and stenosis of unspecified carotid artery: Secondary | ICD-10-CM | POA: Insufficient documentation

## 2010-11-17 DIAGNOSIS — R209 Unspecified disturbances of skin sensation: Secondary | ICD-10-CM | POA: Insufficient documentation

## 2010-11-24 ENCOUNTER — Ambulatory Visit
Admission: RE | Admit: 2010-11-24 | Discharge: 2010-11-24 | Disposition: A | Discharge status: Home / Self Care | Payer: No Typology Code available for payment source | Source: Ambulatory Visit | Attending: Neurology | Admitting: Neurology

## 2010-11-24 DIAGNOSIS — R200 Anesthesia of skin: Secondary | ICD-10-CM

## 2010-12-07 LAB — COMPREHENSIVE METABOLIC PANEL
ALT: 11 U/L (ref 0–35)
AST: 15 U/L (ref 0–37)
Alkaline Phosphatase: 68 U/L (ref 39–117)
CO2: 31 mEq/L (ref 19–32)
Calcium: 9 mg/dL (ref 8.4–10.5)
Chloride: 100 mEq/L (ref 96–112)
GFR calc non Af Amer: 52 mL/min — ABNORMAL LOW (ref >60)
Glucose, Bld: 103 mg/dL — ABNORMAL HIGH (ref 70–99)
Potassium: 3.3 mEq/L — ABNORMAL LOW (ref 3.5–5.1)
Sodium: 138 mEq/L (ref 135–145)
Total Bilirubin: 0.5 mg/dL (ref 0.3–1.2)

## 2010-12-07 LAB — DIFFERENTIAL
Basophils Absolute: 0.1 10*3/uL (ref 0.0–0.1)
Basophils Relative: 2 % — ABNORMAL HIGH (ref 0–1)
Eosinophils Absolute: 0.2 10*3/uL (ref 0.0–0.7)
Eosinophils Relative: 2 % (ref 0–5)
Monocytes Absolute: 0.4 10*3/uL (ref 0.1–1.0)

## 2010-12-07 LAB — POCT CARDIAC MARKERS
Myoglobin, poc: 142 ng/mL (ref 12–200)
Troponin i, poc: <0.05 ng/mL (ref 0.00–0.09)

## 2010-12-07 LAB — CBC
MCV: 73.8 fL — ABNORMAL LOW (ref 78.0–100.0)
Platelets: 210 10*3/uL (ref 150–400)
RBC: 5.03 MIL/uL (ref 3.87–5.11)
WBC: 9.2 10*3/uL (ref 4.0–10.5)

## 2010-12-24 ENCOUNTER — Encounter: Payer: Self-pay | Admitting: Gastroenterology

## 2011-01-05 LAB — CBC
HCT: 39.2 % (ref 36.0–46.0)
Hemoglobin: 12.7 g/dL (ref 12.0–15.0)
MCHC: 32.4 g/dL (ref 30.0–36.0)
MCV: 74.7 fL — ABNORMAL LOW (ref 78.0–100.0)
Platelets: 168 10*3/uL (ref 150–400)
RDW: 15.8 % — ABNORMAL HIGH (ref 11.5–15.5)

## 2011-01-05 LAB — COMPREHENSIVE METABOLIC PANEL
Albumin: 3.3 g/dL — ABNORMAL LOW (ref 3.5–5.2)
Alkaline Phosphatase: 59 U/L (ref 39–117)
BUN: 10 mg/dL (ref 6–23)
Creatinine, Ser: 0.88 mg/dL (ref 0.4–1.2)
Glucose, Bld: 79 mg/dL (ref 70–99)
Total Protein: 6.6 g/dL (ref 6.0–8.3)

## 2011-01-13 ENCOUNTER — Ambulatory Visit (INDEPENDENT_AMBULATORY_CARE_PROVIDER_SITE_OTHER): Payer: No Typology Code available for payment source | Admitting: Gastroenterology

## 2011-01-13 ENCOUNTER — Encounter: Payer: Self-pay | Admitting: Gastroenterology

## 2011-01-13 DIAGNOSIS — Z1211 Encounter for screening for malignant neoplasm of colon: Secondary | ICD-10-CM

## 2011-01-13 DIAGNOSIS — K589 Irritable bowel syndrome without diarrhea: Secondary | ICD-10-CM

## 2011-01-13 DIAGNOSIS — K219 Gastro-esophageal reflux disease without esophagitis: Secondary | ICD-10-CM

## 2011-01-13 NOTE — Progress Notes (Signed)
Cc to PCP 

## 2011-01-13 NOTE — Assessment & Plan Note (Signed)
Continue OMP. OPV in 6 mos.

## 2011-01-13 NOTE — Progress Notes (Signed)
Reminder in epic for pt to follow up in 6 months °

## 2011-01-13 NOTE — Progress Notes (Signed)
  Subjective:    Patient ID: LEYANA WHIDDEN, female    DOB: [birth year], 76 y.o.   MRN: [id number]  PCP: Dr. Sudie Bailey  HPI Having problem with arthritis and pain in posterior pelvis. Bms: 1, sometimes 2. No NVD. No abd pain. Lost 12 lbs since 2009, unintentional. Doesn't feel good secondary to pain issues. No heartburn or indigestion. No need for refills.  Past Medical History  Diagnosis Date  . GERD (gastroesophageal reflux disease)   . Hyperlipidemia   . Hypertension   . IBS (irritable bowel syndrome)       Review of Systems  per HPI otherwise all systems negative. SEP 2011: NL CBC, CMP, TSH, 185 LBS, CT ABD-HEPATIC CYSTS    Objective:   Physical Exam  Constitutional: She appears well-developed.  HENT:  Head: Normocephalic and atraumatic.  Cardiovascular: Normal rate and regular rhythm.   Pulmonary/Chest: Effort normal and breath sounds normal.  Abdominal: Soft. Bowel sounds are normal. She exhibits no distension. There is no tenderness.          Assessment & Plan:

## 2011-01-13 NOTE — Assessment & Plan Note (Signed)
Continue meds. 

## 2011-02-02 NOTE — H&P (Signed)
Katherine Joseph, LARGE              ACCOUNT NO.:  [id number]   MEDICAL RECORD NO.:  [id number]          PATIENT TYPE:  AMB   LOCATION:  DAY                           FACILITY:  [facility]   PHYSICIAN:  Tilda Burrow, M.D. DATE OF BIRTH:  08/03/1935   DATE OF ADMISSION:  11/07/2008  DATE OF DISCHARGE:  LH                              HISTORY & PHYSICAL   ADMITTING DIAGNOSES:  Postmenopausal endometrial polyp, postmenopausal  bleeding, benign ovarian cysts, stable.   HISTORY OF PRESENT ILLNESS:  This is a 76 year old African American  female who is admitted at this time for hysteroscopy and D and C  regarding endometrial thickening.  She has been followed in our office  since age 25 when in August 2004, she had an episode of postmenopausal  bleeding.  Endometrial biopsy at that time showed a benign endometrium  and no malignancy.  She had been fine for several years and had a  similar episode in 2008 with biopsy showing hemorrhagic and infarcted  smooth muscle.  Transvaginal ultrasound and CT in January 2009 showed a  small asymptomatic cyst in each ovary and evaluation of CA-125 showed a  level of 4.4 which is totally benign.  She has had a transvaginal  ultrasound also which showed an endometrial thickening of 16 mm  suggesting of an endometrial abnormality such as a polyp or protruding  fibroid.  We discussed treatment options.  Given that we have biopsied  it, we were relatively comfortable this is not malignant, but she is  frustrated by the recurrence of the bleeding.  She desires to proceed  with endometrial assessment, hysteroscopy and D and C  is therefore  scheduled.  We have also offered to proceed with laparoscopy to remove  the ovaries but given the low CA-125 and unchanged ultrasound, she is  comfortable and declines consideration of that approach.  I am  comfortable with her decision.   PAST MEDICAL HISTORY:  Positive for hypertension __managed __ by Dr.  Sudie Bailey through  [facility].   She has no allergies.   Nonsmoker and nondrinker.  Denies recreational drugs.   HEENT:  Pupils equal, round, and reactive.  NECK:  Supple.  CHEST:  Clear to auscultation.  ABDOMEN:  Nontender.  EXTERNAL GENITALIA:  Normal.  Cervix:  She had benign Pap smear, with  uterus mobile and nontender with  thickened endometrium on ultrasound.  Adnexa showed previously mentioned  cysts.  EXTREMITIES:  Grossly normal.   PLAN:  Hysteroscopy and D and C on November 07, 2008.      Tilda Burrow, M.D.  Electronically Signed     JVF/MEDQ  D:  11/06/2008  T:  11/07/2008  Job:  [id number]   cc:   [facility]

## 2011-02-02 NOTE — Op Note (Signed)
NAMECARENA, STREAM              ACCOUNT NO.:  [id number]   MEDICAL RECORD NO.:  [id number]          PATIENT TYPE:  AMB   LOCATION:  DAY                           FACILITY:  [facility]   PHYSICIAN:  Tilda Burrow, M.D. DATE OF BIRTH:  [birth year]   DATE OF PROCEDURE:  DATE OF DISCHARGE:                               OPERATIVE REPORT   PREOPERATIVE DIAGNOSES:  1. Postmenopausal bleeding.  2. Suspected endometrial polyp.   POSTOPERATIVE DIAGNOSES:  1. Postmenopausal bleeding.  2. Suspected endometrial polyp.   PROCEDURE:  Hysteroscopy, D&C removal, endometrial polyp.   SURGEON:  Tilda Burrow, MD   ASSISTANT:  None.   ANESTHESIA:  General.   COMPLICATIONS:  None.   FINDINGS:  Uterus sounding to to 9 cm, large 2-1/2 cm x 1 cm x 1 cm  endometrial polyp and smaller 1cm x 5 mm polyp both trimmed.   DETAILS OF PROCEDURE:  The patient was taken to the operating room,  prepped and draped for vaginal procedure after timeout conducted and  procedure confirmed by all parties.  Single-tooth tenaculum was used to  grasp the cervix.  After speculum inserted and the cervix was dilated to  25-French, allowing introduction of rigid 30-degree operative  hysteroscope with visualization of the endometrial cavity showing 2  large endometrial polyps both of which had broad base pedicles.  The  operative scissors were guided through the hysteroscope and we used to  snip the base of the smaller polyp, the large polyp was difficult to  access and so was better achieved by using the endometrial hysteroscopic  biopsy instrument as a cutting device.  The stalk could be disrupted.  Efforts to grasp the polyps and extract them through the hysteroscope  were unsuccessful due to the large size of the polyp and linear diameter  of the lower uterine segment.  With care, a hysteroscopic grasping  device was slid outside the hysteroscope along the endometrial cavity  until the specimen could be grasped.   The hysteroscope was then removed  and a uterine grasping forceps slid in to sufficiently grasp the tissue  beside the hysteroscopic grasper and with careful slow  traction we were able to extract the larger polyp.  Smaller polyp was  more easily removed.  Both specimens were sent to pathology.  The  uterine curettage obtained minimal additional tissue.  The patient went  to recovery room in stable condition and will be discharged home.  Sponge and needle counts correct.      Tilda Burrow, M.D.  Electronically Signed     JVF/MEDQ  D:  11/08/2008  T:  11/09/2008  Job:  [id number]   cc:   [facility]

## 2011-02-02 NOTE — Assessment & Plan Note (Signed)
NAMEMarland Kitchen  CLORA, OHMER               CHART#:  [id number]   DATE:  03/16/2007                       DOB:  02-01-1935   REFERRING PHYSICIAN:  Gareth Morgan, M.D.   PROBLEM LIST:  1. Irritable bowel syndrome with bloating, constipation, frequent      stools.  2. Gastroesophageal reflux disease.  3. Esophageal dysphagia status post dilation in 08/2005  4. Hypertension.  5. Hyperlipidemia.   ALLERGIES:  IV dye.   SUBJECTIVE:  Ms. Martie Round is a 76 year old female who presents for a  return patient visit.  She is still taking her Align and is doing pretty  good.  She gets it at [facility].  She has tried Digestive Advantage in  the past.  She is having 1 to 2 bowel movements a day and also takes in  fiber.  She is not straining when having a bowel movement.  She states  she is still fat through the belly.  She is not having any constipation  or loose stool.  She also drinks a glass of Plum-Start almost every  morning.  It contains fiber.   MEDICATIONS:  1. Align samples.  2. Lisinopril/hydrochlorothiazide 20/12.5 daily.  3. Lipitor 20 mg daily.  4. Aspirin daily.  5. Librax 1 to 3 daily.  6. Depakote ER 750 mg nightly.  7. Omeprazole 20 mg daily.  8. Lasix 40 mg daily.  9. Chloroquine 20 mEq daily.   OBJECTIVE:  Weight 200 pounds.  Height 5 foot 9 inches.  Temperature 93.  Blood pressure 118/80, pulse 76.  LUNGS:  Clear to auscultation  bilaterally. CARDIOVASCULAR:  Exam is a regular rhythm, no murmur.  ABDOMEN:  Bowel sounds present, soft, nontender, nondistended, obese.   ASSESSMENT:  Ms. Risden is a 76 year old female with irritable bowel  whose constipation and loose stools have resolved.  Her gastroesophageal  reflux disease is controlled with omeprazole 20 mg daily.  Thank you for  allowing me to see the patient in consultation.  My recommendations  follow.   RECOMMENDATIONS:  1. She can return to see me in 6 months.  She may also elect to see      Dr. Karilyn Cota in [place].  2. She should continue the Align and the Plum-Start.  3. She should continue the omeprazole for her gastroesophageal reflux      disease.       Kassie Mends, M.D.  Electronically Signed     SM/MEDQ  D:  03/17/2007  T:  03/17/2007  Job:  [id number]   cc:   Mila Homer. Sudie Bailey, M.D.

## 2011-02-02 NOTE — Assessment & Plan Note (Signed)
NAMEMarland Kitchen  Katherine Joseph, Katherine Joseph               CHART#:  [id number]   DATE:  10/05/2007                       DOB:  04/09/1935   REFERRING PHYSICIAN:  Mila Homer. Sudie Bailey, M.D.   PROBLEM LIST:  1. Irritable bowel with bloating, constipation and frequent stools.  2. Gastroesophageal reflux disease.  3. Dysphagia status post dilation in December 2006.  4. Hypertension.  5. Hyperlipidemia.  6. Allergy to IVP dye.   SUBJECTIVE:  [name] is a 76 year old female who presents as a return  patient visit.  Her abdomen has been fairly fine.  She is complaining of  an unintentional 16 pound weight loss over the last year and a half.  She says her legs are getting skinny and flabby.  She has two bowel  movements a day.  She denies any diarrhea.  She was seen for bleeding,  which sounds like it may have been vaginal or urinary.  She denies any  rectal bleeding.  She reports having an ultrasound but the results are  not available to me.  She also sounds like she had an endometrial  biopsy.  She reports that she was seeing blood in her urine when she  urinated.  She denies any cold intolerance or any night sweats.  She  does have hot flashes.  She denies anypalpitations.  She has been  complaining of some lower back pain.  The back pain can be so  significant that it causes her not to be able to sleep at nighttime.  She is able to lie on the right side better than the left.   MEDICATIONS:  1. Lisinopril/hydrochlorothiazide.  2. Lipitor.  3. Aspirin.  4. Librax.  5. Depakote.  6. Omeprazole 20 mg daily.  7. Lasix 40 mg daily.  8. Klor-Con.  9. Metoprolol.   OBJECTIVE:  Weight:  194 pounds (down 16 pounds since June 2007).  Height:  5'9.  Temperature:  98.5.  Blood pressure:  120/78.  Pulse:  88.GENERAL:  She is in no apparent distress.  Alert and oriented  x4.LUNGS:  Clear to auscultation bilaterally.  CARDIOVASCULAR:  Regular rhythm.  No murmur.ABDOMEN:  Bowel sounds are  present.  Soft, mildly  distended, nontender.  No rebound or guarding.   ASSESSMENT:  Katherine Joseph is a 76 year old female who has irritable  bowel.  The symptoms seem to be fairly well controlled with Librax and  omeprazole.  She complains of hematuria and a 16 pound weight loss  without any imaging of her genitourinary tract.   Thank you for allowing me to see Katherine Joseph in consultation.  My  recommendations follow.   RECOMMENDATIONS:  1. We will obtain a CT scan of the abdomen and pelvis with oral      contrast due to her allergy to IV dye.  I discussed this with Dr.      Tyron Russell and we should be able to see solid tumors adequately with      this mode of imaging.  2. She should continue the omeprazole and the Librax.  3. She has a follow up appointment to see me in three to four months.  4. We will call her with the results of her CT scan.       Kassie Mends, M.D.  Electronically Signed     SM/MEDQ  D:  10/05/2007  T:  10/05/2007  Job:  [id number]   cc:   Mila Homer. Sudie Bailey, M.D.

## 2011-02-02 NOTE — Assessment & Plan Note (Signed)
NAMEMarland Kitchen  MYKENNA, VIELE               CHART#:  [id number]   DATE:  02/05/2008                       DOB:  05/21/1935   REFERRING Izac Faulkenberry:  Mila Homer. Sudie Bailey, M.D.   PROBLEM LIST:  1. Irritable bowel or blood and consultation and frequent stools.  2. Gastroesophageal reflux disease.  3. Dysphagia with dilation in December 2006.  4. Hypertension.  5. Hyperlipidemia.  6. Allergy to IVP dye.   SUBJECTIVE:  Ms. Axelrod is a 76 year old female who presents as a  return patient visit.  She was last seen in January 2009.  She had the  CT scan because of her weight loss and symptoms and CT scan showed  bilateral cysts in her ovaries.  She was complaining of blood or urine  as well and was referred to Dr. Rito Ehrlich.  He said she had a small stone  which was not clinically significant.  Dr. Emelda Fear does not plan to do  any interventions for her ovaries unless she has pain or bleeding.  She  also has apparently a polyp in her uterus.  Sometimes she complains of  sensation in her throat which feels like fullness, it is usually  associated with needing to have a bowel movement.  She is having one  bowel movement a day.  She does not take the Librax every day because  she says it is too expensive.  She also occasionally takes probiotics.  She is taking the omeprazole daily.  Her heartburn and her indigestion  are controlled.   MEDICATIONS:  1. Probiotics daily.  2. Lisinopril/hydrochlorothiazide.  3. Lipitor.  4. Aspirin.  5. Librax.  6. Depakote.  7. Omeprazole.  8. Lasix.  9. Klor-Con.  10.Metoprolol.  11.Pain pills.   PHYSICAL EXAMINATION:  VITAL SIGNS:  Weight 199 pounds (up 5 pounds  since January 2009), height 5 feet 9 inches, temperature 98.4, blood  pressure 140/88, pulse 76.  GENERAL:  She is in no apparent distress.  Alert and oriented x4.  LUNGS:  Clear to auscultation bilaterally.  CARDIOVASCULAR:  Regular  rhythm.  ABDOMEN:  Bowel sounds present, soft, nontender,  nondistended.   ASSESSMENT:  Ms. Rhett is a 76 year old female whose irritable bowel  symptoms are fairly well-controlled.  Thank you for allowing me to see  Ms. Curt in consultation.  My recommendations follow.   RECOMMENDATIONS:  1. She should continue the Librax, omeprazole and  the probiotics.  2. She has follow-up appointment to see me in six months.       Kassie Mends, M.D.  Electronically Signed     SM/MEDQ  D:  02/05/2008  T:  02/05/2008  Job:  [id number]   cc:   Mila Homer. Sudie Bailey, M.D.

## 2011-02-02 NOTE — Assessment & Plan Note (Signed)
NAMEMarland Kitchen  Katherine Joseph, Katherine Joseph               CHART#:  [id number]   DATE:  08/13/2008                       DOB:  [birth year]   REFERRING PHYSICIAN:  Mila Homer. Sudie Bailey, MD   PROBLEM LIST:  1. Irritable bowel with frequent stools.  2. Gastroesophageal reflux disease.  3. Dysphagia with dilation in December 2006, which relieved her      symptoms.  4. Hypertension.  5. Hyperlipidemia.  6. Allergies to IVP dye.   SUBJECTIVE:  Katherine Joseph is a 76 year old female who presents as a  return patient visit.  She was last seen in September 2009 and was  complaining of fluttering that began in her abdomen and moved into her  throat.  She had a modified barium swallow, which showed no evidence of  oropharyngeal dysphagia and her barium swallow showed a small hiatal  hernia.  No evidence of reflux and normal esophageal motility.  She did  have cervical spondylosis.  She states that she still feels her throat  quivers, but it is better.  She feels like that happens when is time  for her to have a bowel movement.  She denies any heartburn or  indigestion.   MEDICATIONS:  1. Align.  2. Lisinopril.  3. Lipitor.  4. Aspirin.  5. Librax 1-3 daily.  6. Depakote.  7. Omeprazole daily.  8. Lasix.  9. Klor-Con.  10.Metoprolol.  11.Hydrocodone as needed.   OBJECTIVE:  VITAL SIGNS:  Weight 198 pounds (down 1 pound since  September 2009), height 5 feet 9 inches, temperature 98.3, blood  pressure 130/80, and pulse 72.  GENERAL:  She is in no apparent distress.  Alert and oriented x4.LUNGS:  Clear to auscultation bilaterally.CARDIOVASCULAR:  Regular  rhythm.ABDOMEN:  Bowel sounds are present.  Soft, nontender, and  nondistended.   ASSESSMENT:  Katherine Joseph is a 76 year old female who has a functional  gut disorder.  The quivering may be due to bowel spasm or esophageal  spasm from breakthrough or acid exposure. Thank you for allowing me to  see Katherine Joseph in consultation.  My recommendations follow.   RECOMMENDATIONS:  1. She should continue her omeprazole.  2. She may continue to use the Librax as needed.  3. Follow up appointment in 4 months.  She may see me sooner if she      feels she needs too.       Kassie Mends, M.D.  Electronically Signed     SM/MEDQ  D:  08/13/2008  T:  08/13/2008  Job:  [id number]   cc:   Mila Homer. Sudie Bailey, M.D.

## 2011-02-02 NOTE — Assessment & Plan Note (Signed)
NAMEMarland Kitchen  Katherine Joseph, Katherine Joseph               CHART#:  [id number]   DATE:  05/23/2008                       DOB:  May 17, 1935   REFERRING PHYSICIAN:  Mila Homer. Sudie Bailey, MD   PROBLEM LIST:  1. Irritable bowel and frequent stools.  2. Gastroesophageal reflux disease.  3. Dysphagia with dilation in December 2006, which relieved her      symptoms.  4. Hypertension.  5. Hyperlipidemia.  6. Allergy to IVP dye.   SUBJECTIVE:  The patient is a 76 year old female who was seen as a  return patient visit.  She was last seen in May 2009.  She states she  feels like something is in her throat.  She feels if she pushes in her  stomach she can feel the sensation in her throat.  Rubbing on her  stomach makes the sensation in her throat go away.  She states she  swallows just fine.  She denies any vomiting, nausea, heartburn,  indigestion, weight loss, or abdominal pain.  She states this is not  like in 2006 when she could not swallow meat.   MEDICATIONS:  1. Align.  2. Lisinopril.  3. Lipitor.  4. Aspirin.  5. Librax.  6. Depakote.  7. Omeprazole.  8. Furosemide.  9. Klor-Con.  10.Metoprolol.  11.Hydrocodone as needed.   OBJECTIVE:   PHYSICAL EXAMINATION:  VITAL SIGNS: Weight 198 pounds (down 1 pound  since May 2009), height 5 feet 9 inches, temperature 98.4, blood  pressure 130/80, and pulse 64.  GENERAL:  She is in no apparent distress.  Alert and oriented x4.NECK:  No thyromegaly or lymphadenopathy.  Full range of motion.LUNGS:  Clear  to auscultation bilaterally.CARDIOVASCULAR:  Regular rhythm.  No murmur.  Normal S1 and S2.ABDOMEN:  Bowel sounds are present.  Soft, nontender,  and nondistended.  No rebound or guarding.EXTREMITIES:  No cyanosis or  edema.   ASSESSMENT:  The patient is a 76 year old female who presents as a  return patient visit.  She has symptoms of globus sensation.  The  differential diagnosis includes oropharyngeal dysphagia or esophageal  motility disorder.  She  could have breakthrough gastroesophageal reflux  disease.  Thank you for allowing me to see the patient in consultation.  My recommendations are as follow.   RECOMMENDATIONS:  1. She should continue her omeprazole 20 mg daily.  2. Will order a modified barium swallow to evaluate for oropharyngeal      dysphagia and a barium swallow to evaluate for esophageal motility      disorder.  If these studies did not reveal any etiology for her      symptoms and we will do an EGD with a Bravo study.  3. She has a follow up appointment to see me in 2-3 months.       Kassie Mends, M.D.  Electronically Signed     SM/MEDQ  D:  05/23/2008  T:  05/23/2008  Job:  [id number]   cc:   Mila Homer. Sudie Bailey, M.D.

## 2011-02-05 NOTE — Op Note (Signed)
Katherine Joseph, Katherine Joseph              ACCOUNT NO.:  [id number]   MEDICAL RECORD NO.:  [id number]          PATIENT TYPE:  AMB   LOCATION:  DAY                           FACILITY:  [facility]   PHYSICIAN:  R. Roetta Sessions, M.D. DATE OF BIRTH:  06/16/1935   DATE OF PROCEDURE:  08/26/2005  DATE OF DISCHARGE:                                 OPERATIVE REPORT   PROCEDURE:  Esophagogastroduodenoscopy with Elease Hashimoto dilation.   INDICATIONS FOR PROCEDURE:  The patient is a 76 year old lady with  esophageal dysphagia in the background setting of gastroesophageal reflux  disease symptoms. She is not taking any acid suppression therapy. EGD is now  being done. This approach has been discussed with the patient at length.  Potential risks, benefits, and alternatives have been reviewed and questions  answered. She is agreeable. Please see documentation in the medical record.   PROCEDURE NOTE:  O2 saturation, blood pressure, pulse, and respirations were  monitored throughout the entire procedure. Conscious sedation with Versed 5  mg IV and Demerol 125 mg IV in divided doses. Cetacaine spray for topical  oropharyngeal anesthesia.   INSTRUMENT:  Olympus video chip system.   FINDINGS:  Examination of the tubular esophagus revealed three long linear  distal esophageal erosions. There was no Barrett's esophagus. No obvious  ring, stricture or other abnormality. EG junction easily traversed.   Stomach:  Gastric cavity was empty and insufflated well with air. Thorough  examination of gastric mucosa including retroflexed view of the proximal  stomach and esophagogastric junction demonstrated no abnormalities. Pylorus  patent and easily traversed. Examination of bulb and second portion revealed  no abnormalities.   THERAPEUTIC/DIAGNOSTIC MANEUVERS:  A 56-French Maloney dilator was passed to  full insertion with ease. A look back revealed slight rent at the EG  junction. Otherwise no apparent complication related  to passage of the  dilator.   The patient tolerated the procedure well and was reactive to endoscopy.   IMPRESSION:  Multiple long linear distal esophageal erosions consistent with  moderately severe erosive reflux esophagitis status post Maloney dilation as  described above. Otherwise normal esophagus. Normal stomach, D1 and D2.   RECOMMENDATIONS:  1.  Gastroesophageal reflux disease. Treatment literature provided to Ms.      Carroll.  2.  Begin Aciphex 20 mg orally each morning before breakfast. The patient is      to go by my office for #30 free samples. I have given her a      prescription.  3.  We will plan to see this nice lady back in the office to see how she is      doing in four to six weeks.      Jonathon Bellows, M.D.  Electronically Signed     RMR/MEDQ  D:  08/26/2005  T:  08/26/2005  Job:  [id number]   cc:   Mila Homer. Sudie Bailey, M.D.  Fax: [phone]

## 2011-02-05 NOTE — H&P (Signed)
NAMEASMAA, Katherine Joseph              ACCOUNT NO.:  [id number]   MEDICAL RECORD NO.:  [id number]          PATIENT TYPE:  AMB   LOCATION:                                FACILITY:  [facility]   PHYSICIAN:  R. Roetta Sessions, M.D. DATE OF BIRTH:  01-16-1935   DATE OF ADMISSION:  08/18/2005  DATE OF DISCHARGE:  LH                                HISTORY & PHYSICAL   CHIEF COMPLAINT:  Problem swallowing.   HISTORY OF PRESENT ILLNESS:  Katherine Joseph is a 76 year old African American  female with a history of IBS who presents today for further evaluation of  difficulty swallowing.  She was last seen on April 28, 2005.  At that time  she was doing fairly well.  She says she has had difficulty swallowing solid  foods especially meat.  She feels like it is getting stuck on the way down.  This has been progressive over the last several months.  She has also had  some intermittent heartburn symptoms.  She takes Tums on a p.r.n. basis.  Her last EGD was in July 2004.  At that time she had presented to the  hospital with a GI bleed, after a colonic polypectomy.  EGD was unremarkable  other than wavy squamocolumnar junction.  She denies any nausea or vomiting,  abdominal pain.  She continues to have intermittent bouts of increased stool  frequency related to certain foods, otherwise her stools are normal.  Denies  any melena or rectal bleeding.  She remains on Librax and Fiber Choice.   CURRENT MEDICATIONS:  1.  Zestoretic 20/12.5 mg every day.  2.  Aspirin 81 mg every day.  3.  Librax q.i.d.  4.  Lipitor 20 mg every day.  5.  Toprol XL 50 mg every day.  6.  Depakote ER 500 mg q.h.s.   ALLERGIES:  No known drug allergies.   PAST MEDICAL HISTORY:  1.  Hypertension.  2.  Hypercholesterolemia.  3.  Gastroesophageal reflux disease.  4.  IBS.  5.  She had a colonoscopy, April 09, 2003, at which time she was found to      have a 0.75-cm pedunculated polyp at the splenic flexure.  Pathology      revealed an  inflammatory polyp.  She subsequently developed a post      polypectomy bleed and had repeat colonoscopy, April 15, 2003.  The      polypectomy site was marked by a small area of raised mucosa with a tiny      ulcer with clean base.  The site was coagulated.  She also had three      tiny diverticula at the sigmoid colon.  She has been recommended to have      a follow up colonoscopy in five years, given family history of      colorectal cancer.  This would be July 2009.   PAST SURGICAL HISTORY:  She had a right knee arthroscopy for torn cartilage  by Dr. Romeo Apple within the last couple of years.   FAMILY HISTORY:  Mother died of old age.  Father  had colon cancer.   SOCIAL HISTORY:  She is widowed, has nine children.  No tobacco or alcohol  use.  She is retired.   REVIEW OF SYSTEMS:  See HPI for GI.  CONSTITUTIONAL:  No weight loss.  CARDIOPULMONARY:  No chest pain or shortness of breath.   PHYSICAL EXAMINATION:  VITAL SIGNS:  Weight 206.5, height 5 foot 9 inches,  temp 98.3, blood pressure 142/80, pulse 70.  GENERAL:  A pleasant, mildly obese black female, in no acute distress.  SKIN:  Warm and dry.  No jaundice.  HEENT:  Conjunctivae are pink.  Sclerae are nonicteric.  Oropharyngeal  mucosa moist and pink.  No lesions, erythema, or exudate.  No  lymphadenopathy, thyromegaly.  CHEST:  Lungs are clear to auscultation.  CARDIAC:  Reveals a regular rate and rhythm.  Normal S1 S2.  No murmurs,  rubs, or gallops.  ABDOMEN:  Positive bowel sounds.  Soft, nontender, nondistended.  No  organomegaly or masses.  No rebound tenderness or guarding.  No abdominal  bruits or hernias.  Liver edge palpable 3-cm below the right costal margin  in the mid clavicular line.  EXTREMITIES:  No edema.   IMPRESSION:  1.  Katherine Joseph is a 75 year old lady who presents with complaints of      gastroesophageal reflux and progressive esophageal solid food dysphagia.      She may have developed esophageal web  ring or stricture.  I have offered      an EGD with esophageal dilatation at this time.  I discussed risks,      alternatives, and benefits with regards to procedure, but not limited      to, the risk of bleeding, infection, perforation, reaction to      medication, and she is agreeable to proceed.  2.  Irritable bowel syndrome appears to be stable at this time.   PLAN:  1.  EGD with esophageal dilatation in the near future.  2.  She will hold her aspirin three days prior to procedure.  3.  Further recommendations to follow.      Tana Coast, P.AJonathon Bellows, M.D.  Electronically Signed    LL/MEDQ  D:  08/18/2005  T:  08/18/2005  Job:  [id number]   cc:   Mila Homer. Sudie Bailey, M.D.  Fax: [phone]

## 2011-02-05 NOTE — Discharge Summary (Signed)
NAME:  Katherine Joseph, Katherine Joseph                        ACCOUNT NO.:  [id number]   MEDICAL RECORD NO.:  [id number]                   PATIENT TYPE:  INP   LOCATION:  A222                                 FACILITY:  [facility]   PHYSICIAN:  Lionel December, M.D.                 DATE OF BIRTH:  1934/09/30   DATE OF ADMISSION:  04/14/2003  DATE OF DISCHARGE:  04/16/2003                                 DISCHARGE SUMMARY   ADMISSION DIAGNOSIS:  Lower gastrointestinal bleed felt to be due to recent  polypectomy.   DISCHARGE DIAGNOSES:  1. Postpolypectomy bleed with resultant anemia.  2. Hypertension.  3. Hyperlipidemia.  4. Irritable bowel syndrome.   PROCEDURE:  On April 15, 2003, colonoscopy revealed an excellent prep with no  measurable blood.  Tiny diverticula at the sigmoid colon.  A tiny ulcer on  raised mucosa at polypectomy sight where splenic flexure was coagulated.  Esophagogastroduodenoscopy was normal.   HISTORY OF PRESENT ILLNESS:  The patient is a 76 year old, African-American  female who underwent colonoscopy by Dr. Jena Gauss on April 09, 2003, because of  change in bowel habits with stools and urgency.  She had a single  pedunculated polyp in the splenic flexure which was snared.  The patient did  well until the evening prior to admission when she noticed blood on the  toilet tissue.  On the day of admission, she had a bowel movement and noted  passage of a large amount of darkened, burgundy blood.  She did not  experience any abdominal pain, nausea, vomiting, weakness or postural  lightheadedness.  She came to the ED for evaluation.   HOSPITAL COURSE:  In the emergency department, she had normal vital signs  with no evidence of orthostasis, however, she had fresh blood on digital  exam.  She was admitted for monitoring for the treatment with colonoscopy if  needed.  The patient denied any aspirin or NSAID use.  The last aspirin that  she took was on April 06, 2003.  Admission labs revealed  hemoglobin 12.4,  hematocrit 39, MCV 72.7, platelets 160,000 and WBC was 9.  PT was 12.8 and  PTT 32.  Glucose was 119, albumin 3.3, otherwise LFTs were normal.  On the  day of admission, she was monitored closely.  She had a couple of dark,  small BMs.  On the next morning, her hemoglobin rebound to 11.3.  She had no  further bleeding at that time, but later during the day developed passage of  fresh blood as well as burgundy stools.  She therefore was prepped for  colonoscopy.  Findings were as outlined above.  On the day of discharge, her  hemoglobin dropped to 10.5, but she had no evidence of further bleeding.  The patient was felt to be stable for discharge.   On the day of discharge, examination revealed lungs to be clear to  auscultation.  Cardiac exam  reveals a regular rate and rhythm with normal  S1, S2, no murmurs, rubs or gallops.  Abdomen with positive bowel sounds,  soft, nontender and nondistended.  Lower extremities with no edema.   CONDITION ON DISCHARGE:  Good.   DISPOSITION:  Discharged to home.   DISCHARGE MEDICATIONS:  1. Resume home medications except we will hold aspirin for another two     weeks.  2. Niferex 150 mg one tablet twice a day.   SPECIAL INSTRUCTIONS:  She is advised not to consume NSAIDs for a total of  two weeks.  Notify the physician if she develops any further bleeding.   FOLLOW UP:  She will have H&H two days prior to office visit with Dr. Jena Gauss  in four weeks.     Tana Coast, P.A.                        Lionel December, M.D.    LL/MEDQ  D:  04/16/2003  T:  04/16/2003  Job:  [id number]   cc:   Mila Homer. Sudie Bailey, M.D.  [address] [place], [place] [address]  Fax: [phone]

## 2011-02-05 NOTE — Op Note (Signed)
NAME:  Katherine Joseph, Katherine Joseph                        ACCOUNT NO.:  [id number]   MEDICAL RECORD NO.:  [id number]                   PATIENT TYPE:  INP   LOCATION:  A222                                 FACILITY:  [facility]   PHYSICIAN:  Lionel December, M.D.                 DATE OF BIRTH:  1935-04-12   DATE OF PROCEDURE:  DATE OF DISCHARGE:                                  PROCEDURE NOTE   PROCEDURE:  1. Total colonoscopy.  2. Coagulation of a small ulcer at polypectomy site followed by     esophagogastroduodenoscopy.   ENDOSCOPIST:  Lionel December, M.D.   INDICATIONS:  Katherine Joseph is a 76 year old African-American female who had  colonoscopy with polypectomy April 09, 2003. She presented yesterday with  acute GI bleed.  She is still passing some blood per rectum; and she is,  therefore, undergoing these studies.  The procedure and risks were reviewed  with the patient and informed consent was obtained.   PREOPERATIVE MEDICATIONS:  Demerol 50 mg IV and Versed 9 mg IV in divided  doses and Cetacaine spray for topical oropharyngeal anesthesia.   FINDINGS:  Procedures performed in endoscopy suite.  The patient's vital  signs and O2 saturation were monitored during the procedure and remained  stable.   PROCEDURE #1: TOTAL COLONOSCOPY:  The patient was placed in the left lateral  recumbent position and rectal examination was performed.  No abnormality  noted on external or digital exam.   Olympus videoscope was placed in the rectum and advanced under vision into  the sigmoid colon and beyond.  Preparation was excellent.  There was no  fresh or old blood noted in the colon.  The scope was passed to the cecum  which was identified by ileocecal valve and the blunt end of the cecum was  normal.  As the scope was withdrawn colonic mucosa was carefully examined.  There were 3 tiny diverticula at the sigmoid colon.  The polypectomy site  was inconspicuous and found after examining the splenic flexure  for several  minutes.  There was a slightly raised area of mucosa with 1-2 mm ulcer.  However, this had a clean base.  Since I did not find any other potential  bleeding site, I went ahead and coagulated the site with the gold probe.  As  the scope was withdrawn, the rectal mucosa was, once again, carefully  examined.  This was normal as was the anorectal junction seen on retroflexed  view.   The endoscope was withdrawn.  Since I was not convinced as to the bleeding  site I proceeded with esophagogastroduodenoscopy as discussed with the  patient.   PROCEDURE #2: ESOPHAGOGASTRODUODENOSCOPY:  Olympus videoscope was passed via  oropharynx without any difficulty into the esophagus.   ESOPHAGUS:  Mucosa of the esophagus was normal throughout.  Squamocolumnar  junction was wavy, otherwise unremarkable.  No hernia was noted.   STOMACH:  It was empty and distended very well with insufflation.  The folds  of the proximal stomach were normal.  Examination of the mucosa at gastric  body, antrum, pyloric channel, as well as angularis, fundus, and cardia were  normal.   DUODENUM:  Examination of the bulb and postbulbar duodenum was normal.   The endoscope was withdrawn.  The patient tolerated the procedures well.   FINAL DIAGNOSES:  1. Polypectomy site at splenic flexure marked by a small area of raised     mucosa with a tiny ulcer with clean base.  No stigmata of bleeding was     noted.  The site was coagulated.  2. Three tiny diverticula at sigmoid colon.  3. Normal esophagogastroduodenoscopy.   RECOMMENDATIONS:  1. Will advance the diet to a 4 gram sodium diet.  2. She will have an H&H in the a.m. and hopefully she should be able to go     home in the a.m.                                               Lionel December, M.D.    NR/MEDQ  D:  04/15/2003  T:  04/16/2003  Job:  [id number]   cc:   Mila Homer. Sudie Bailey, M.D.  [address] [place], [place] [address]  Fax: [phone]

## 2011-02-05 NOTE — Cardiovascular Report (Signed)
[place]. [facility]  Patient:    Katherine Joseph, Katherine Joseph                      MRN: [id number] Proc. Date: 03/01/00 Adm. Date:  [id number] Disc. Date: [id number] Attending:  Learta Codding CC:         Gareth Morgan, M.D.             Noralyn Pick. Eden Emms, M.D. [facility]             Cardiac Catheterization Laboratory                        Cardiac Catheterization  PROCEDURES PERFORMED:  Left heart catheterization with coronary angiography and left ventriculography.  INDICATIONS:  [name] is a 76 year old woman who presented with a supraventricular tachycardia.  She subsequently had an elevated troponin level and is referred for cardiac catheterization to rule out coronary artery disease.  DESCRIPTION OF PROCEDURE:  A 6 French sheath was placed in the right femoral artery.  Standard Judkins 6 French catheters were utilized.  Contrast was Omnipaque.  There were no complications.  RESULTS:  HEMODYNAMICS:  Left ventricular pressure 170/18.  Aortic pressure 170/94. There was no aortic valve gradient.  LEFT VENTRICULOGRAM:  Wall motion is normal to hyperdynamic.  Ejection fraction greater than 60%.  No mitral regurgitation.  CORONARY ARTERIOGRAPHY:  (Codominant).  Left main:  Normal.  Left anterior descending:  The LAD has a tubular 50% stenosis in the mid vessel at the site of myocardial bridging.  The LAD gives rise to two small diagonal branches.  Left circumflex:  The left circumflex is a codominant vessel.  It gives rise to a small ramus intermedius, small OM-1, large OM-2, normal sized OM-3 and three small posterolateral branches.  OM-2 has a 25% stenosis.  Right coronary artery:  The right coronary artery is a relatively small codominant vessel giving rise to a small posterior descending artery.  IMPRESSION: 1. Normal left ventricular systolic function. 2. Nonobstructive coronary artery disease. DD:  03/01/00 TD:  03/02/00 Job: [id number]

## 2011-02-05 NOTE — H&P (Signed)
NAME:  Katherine Joseph, Katherine Joseph                        ACCOUNT NO.:  [id number]   MEDICAL RECORD NO.:  [id number]                   PATIENT TYPE:  INP   LOCATION:  A222                                 FACILITY:  [facility]   PHYSICIAN:  Lionel December, M.D.                 DATE OF BIRTH:  1935-05-02   DATE OF ADMISSION:  04/14/2003  DATE OF DISCHARGE:                                HISTORY & PHYSICAL   REASON FOR ADMISSION:  Lower gastrointestinal bleed felt to be due to recent  polypectomy.   HISTORY OF PRESENT ILLNESS:  Ms. Wohlfarth is a 76 year old African-American  female who underwent a colonoscopy by Dr. Kendell Bane on 04/09/2003 because of a  change in her bowel habits, pencil-thin stools and urgency.  She had a  single pedunculated polyp in the splenic flexure which was snared.  The  patient did fine until last evening when she noticed some blood on the  tissue.  However, this morning she had a bowel movement and she noted she  passed a large amount of dark and burgundy blood.  She did not experience  abdominal pain, nausea, vomiting, weakness or postural lightheadedness.  She  came to the emergency room.  She was evaluated by Dr. Colon Branch.  She had  normal vital signs and no evidence of orthostasis, however she had fresh  blood on digital exam.  It was therefore decided to bring her into the  hospital for monitoring and further treatment with colonoscopy if necessary.  The patient denies using aspirin or other NSAID's.  The last time she took  aspirin was on 04/06/2003.  She does not take any herbal or OTC medications.  She remains quite concerned with her bowel urgency.  She is having these  symptoms intermittently.  She is also concerned about losing weight, however  if any she has lost more than five pounds recently, but this may not be  correct.  She complains of her lower extremities getting thin, though she  does not have any difficulty with ambulation.   CURRENT MEDICATIONS:  1.  Lisinopril.  2. HCT 10/12.5 daily.  3. Toprol XL 50 mg daily.  4. Lipitor 20 mg daily.  5. Pancreas 2 with each meal and 1 with snack.  6. Naproxen q.i.d. p.r.n.  7. Fibercon tablets __________ a day.   PAST MEDICAL HISTORY:  1. Hypertension.  2. Hyperlipidemia.  3. Irritable bowel syndrome.  4. EGD and colonoscopy a little over three years ago, reportedly were     normal.  5. She also had a cardiac catheterization without significant disease,     although I do not have specific information available at this time.   PAST SURGICAL HISTORY:  She has never had any surgeries.   ALLERGIES:  No known drug allergies.   FAMILY HISTORY:  Both the parents are deceased, father had stomach cancer  and he was in his 30's when  he passed away.  Mother is also deceased.   SOCIAL HISTORY:  She is married, retired, worked at [employer] 28  years.  She does not smoke cigarettes or drink alcohol.  She has nine  children, along with two sets of twins.  All the children are in good  health.   PHYSICAL EXAMINATION:  GENERAL:  Pleasant, well-developed, well-nourished,  African-American female who is in no acute distress.  VITAL SIGNS:  Weight 190.2 pounds, height 5 foot 9 inches, pulse 64 per  minute, blood pressure 151/86, respirations 18, temperature 99.6.  HEENT:  Conjunctivae is pink, sclerae nonicteric, oropharyngeal mucosa is  normal.  NECK:  Without masses or thyromegaly.  CARDIAC:  Regular rhythm, normal S1, S2.  No murmur or gallop noted.  LUNGS:  Clear to auscultation.  ABDOMEN:  Symmetrical and soft, nontender, no organomegaly or masses.  RECTAL:  Deferred as she had one in the emergency room by Dr. Colon Branch  revealing fresh blood in the rectum.  EXTREMITIES:  Do not appear to be thin or wasted to me.  She does not have  clubbing or peripheral edema.   LABORATORY DATA:  WBC 9, H&H 20.4 and 39.0, platelet count 160,000.  Sodium  140, potassium 4.0, chloride 105, CO2 31, glucose 119,  BUN 10, creatinine  1.1, calcium 9.0, bilirubin 0.4, alkaline phosphatase 65, AST 21, ALT 22,  albumin 3.3, PT 12.8, INR 0.9, PTT 32.   ASSESSMENT:  Ms. Leeth is a 76 year old African-American female who  presents with lower gastrointestinal bleed.  She is status post polypectomy  five days ago when she had a small pedunculated polyp removed from her  splenic flexure without any difficulty.  She has not taken any aspirin in at  least 8-9 days.   She appears to be hemodynamically stable.  Her hemoglobin and hematocrit is  normal.    PLAN:  She will be admitted to a monitored bed and watched closely.  She  will serial hemoglobins and hematocrits.  If there is evidence of continued  bleed, she will be prepped for colonoscopy, but I am hoping that she will  stop bleeding spontaneously and no intervention would be necessary.  She  will be typed and crossmatched for two units.                                                Lionel December, M.D.    NR/MEDQ  D:  04/14/2003  T:  04/14/2003  Job:  [id number]   cc:   Mila Homer. Sudie Bailey, M.D.  [address] [place], [place] [address]  Fax: [phone]   R. Roetta Sessions, M.D.  [address]  [place]  [place] [address]  Fax: [phone]

## 2011-02-05 NOTE — Discharge Summary (Signed)
[place]. [facility]  Patient:    Katherine Joseph, Katherine Joseph                      MRN: [id number] Adm. Date:  [id number] Disc. Date: 03/02/00 Attending:  Learta Codding Dictator:   Leonides Cave, P.A. CC:         Lewayne Bunting, M.D.             Thomas C. Daleen Squibb, M.D. [facility], [facility], [place], [place]             Dr. August Saucer, [place]                           Discharge Summary  DISCHARGE DIAGNOSES: 1. Emergency department visit at [facility] for supraventricular    tachycardia and "swimmyheadedness." 2. Minimal coronary disease on cardiac catheterization March 01, 2000. 3. Normal left ventricular function. 4. Hypertension. 5. Multiple gastrointestinal complaints with a recent negative    esophagogastroduodenoscopy and colonoscopy, treated with Librax and    Pancrease.  BRIEF HISTORY:  The patient is a 76 year old female transferred to [facility] on February 28, 2000 from [facility] for a cardiac evaluation.  The patient had sudden onset of dizziness and noted to be in SVT with a heart rate of 200 when seen at [facility].  The patient denied chest pain, shortness of breath, or diaphoresis.  The patient was given adenosine by EMS and the patients rate was 111 by the time she reached [facility].  Cardiac enzymes were found to be positive and she was subsequently transferred to [facility].  HOSPITAL COURSE:  The patient had a 2-D echocardiogram performed on February 29, 2000, which revealed LV function normal with EF of 60%.  The patient then had heart catheterization done on March 01, 2000 by Dr. Gerri Spore. Results are:  Left main normal, LAD with a mid 50% lesion with some myocardial bridging seen.  Left circumflex had a large OM2 vessel coming off with a 25% lesion.  The RCA was a small codominant vessel with a circumflex.  There was no disease seen in that.  Impression was normal LV function and minimal nonobstructive coronary  disease.  The patient was subsequently discharged home in improved condition on the following day, March 02, 2000.  DISCHARGE MEDICATIONS: 1. Toprol XL 25 mg q.d. 2. Altace 2.5 mg b.i.d. 3. Cardizem CD 120 mg q.d. 4. She will continue her Pancrease and Prempro as taken at home.  DISCHARGE INSTRUCTIONS:  The patient was instructed to undergo no heavy lifting, driving, or sexual activity for two days.  She was told to adhere to a low fat/low cholesterol diet.  She was told that if bleeding or swelling occurred at the catheterization site she was to call the [facility] Cardiology office immediately.  FOLLOW-UP:  The patient was told to follow up with her primary care physician, Dr. Sudie Bailey, in two weeks.  She does not need a cardiac follow-up but is more than welcome to see the [facility] Cardiology team on a p.r.n. basis at [facility]. DD:  03/02/00 TD:  03/02/00 Job: [id number]

## 2011-02-05 NOTE — Op Note (Signed)
NAME:  Katherine Joseph, Katherine Joseph                        ACCOUNT NO.:  [id number]   MEDICAL RECORD NO.:  [id number]                   PATIENT TYPE:  AMB   LOCATION:  DAY                                  FACILITY:  [facility]   PHYSICIAN:  R. Roetta Sessions, M.D.              DATE OF BIRTH:  1935-01-12   DATE OF PROCEDURE:  04/09/2003  DATE OF DISCHARGE:                                 OPERATIVE REPORT   PROCEDURE:  Colonoscopy with snare polypectomy.   INDICATION FOR PROCEDURE:  The patient is a 76 year old lady with recent  abdominal swelling and bloating that has been intermittent, also reports  pencil-thin stools.  She has a positive family history of colorectal  neoplasia.  Colonoscopy some three years ago was unremarkable.  It is  notable that since she was seen in our office on April 03, 2003, bowel  function is improved, bloating and gas have improved, and her stools have  become normal in caliber.  Colonoscopy is now being done to further evaluate  her recent change in bowel habits in light of her positive family history.  This approach has been discussed with the patient at length previously and  again today at the bedside.  The potential risks, benefits, and alternatives  have been reviewed.  Please see my April 03, 2003, H&P.   PROCEDURE NOTE:  O2 saturation, blood pressure, pulse, and respirations were  monitored throughout the entire procedure.  Conscious sedation:  Versed 4 mg  IV, Demerol 75 mg IV in divided doses.   INSTRUMENT USED:  Olympus video chip adult colonoscope.   FINDINGS:  Digital rectal examination revealed no abnormalities.   ENDOSCOPIC FINDINGS:  The prep was adequate.   Rectum:  Examination of the rectal mucosa including a retroflexed view of  the anal verge revealed no abnormalities.   Colon:  The colonic mucosa was surveyed from the rectosigmoid junction  through the left, transverse, and right colon, to the area of the  appendiceal orifice, ileocecal valve,  and cecum.  These structures were well-  seen and photographed for the record.  The patient was noted to have a 0.75  cm pedunculated polyp in the area of the splenic flexure, please see photos.  From the level of the cecum and ileocecal valve, the scope was slowly  withdrawn and all previously-mentioned mucosal surfaces were again seen.  No  other abnormalities were observed.  The polyp at the splenic flexure was  resected and recovered with the snare using the ERBE unit.  It was recovered  through the scope without apparent complication.  No other abnormalities  were seen.  The patient tolerated the procedure well, was reacted in  endoscopy.   IMPRESSION:  1. Normal rectum.  2. Pedunculated polyp at the splenic flexure, resected with snare.  3. The remainder of the colonic mucosa appeared normal.   It was reassuring to note that Ms. Hisaw's altered bowel  function has  improved spontaneously over the past week or so.   RECOMMENDATIONS:  1. No aspirin or arthritis medications for 10 days.  2. Follow up on pathology.  3. Follow-up appointment with [place] in three to four weeks.                                               Jonathon Bellows, M.D.    RMR/MEDQ  D:  04/09/2003  T:  04/09/2003  Job:  [id number]   cc:   Mila Homer. Sudie Bailey, M.D.  [address] [place], [place] [address]  Fax: [phone]

## 2011-02-05 NOTE — Op Note (Signed)
NAME:  Katherine Joseph, Katherine Joseph                        ACCOUNT NO.:  [id number]   MEDICAL RECORD NO.:  [id number]                   PATIENT TYPE:  AMB   LOCATION:  DAY                                  FACILITY:  [facility]   PHYSICIAN:  Vickki Hearing, M.D.           DATE OF BIRTH:  [birth year]   DATE OF PROCEDURE:  DATE OF DISCHARGE:                                 OPERATIVE REPORT   INDICATIONS FOR PROCEDURE:  The patient is 76 years old.  She has persistent  and severe right knee pain.  Initial treatment was with anti-inflammatory  agents and analgesics.  She did not improve.  She had an MRI that showed a  lateral meniscal tear and severe degenerative joint disease.  She presented  for right knee arthroscopy and lateral meniscectomy.   PREOPERATIVE DIAGNOSIS:  Osteoarthritis, lateral meniscal tear.   POSTOPERATIVE DIAGNOSIS:  Osteoarthritis, lateral meniscal tear.   PROCEDURE:  1. Arthroscopy.  2. Lateral meniscectomy.  3. Debridement of medial tibial plateau, lateral tibial plateau, lateral     femoral condyle, and patellofemoral joint.  4. Chondroplasty of the patella, lateral femoral condyle, medial tibial     plateau, lateral tibial plateau.   SURGEON:  Vickki Hearing, M.D.   ANESTHESIA:  Spinal.   FINDINGS:  There was a severe tear of the lateral meniscus.  There was  chondromalacia of the lateral femoral condyle, both tibial plateaus, and the  patellofemoral joint.   DESCRIPTION OF PROCEDURE:  The patient was identified as Corporate treasurer.  She was asked to mark her surgical site.  She marked her right knee.  My  initials were signed over the right knee.   She was given Ancef and taken to the operating room for a spinal anesthetic.  After spinal anesthesia, she had a sterile prep and drape.  A time out was  taken to confirm the procedure, extremity as the right knee, and the  patient's proper identification.   Everyone agreed, and we proceeded with a two-incision  arthroscopy.  We  performed diagnostic arthroscopy to evaluate the knee.  The findings are  noted above.  All other structures were normal.   We started in the lateral compartment.  We did a chondroplasty of the tibial  plateau and lateral femoral condyle.  We did a lateral meniscectomy.  We  used a shaver and ArthroCare wand.  We then addressed the tibial plateau on  the medial side.  We did a chondroplasty there.  We did a chondroplasty of  the patella and trochlea.   The knee was washed, cleaned, dried, suctioned free.  The portal sites were  closed with Steri-Strips.  Marcaine 0.5%, 30 cc, was injected.  We applied a  sterile bandage and a CryoCuff.   She was taken to the recovery room in stable condition.   PLAN:  Full weightbearing.  Followup in two days.  She will be discharged  with a CryoCuff.  ___________________________________________                                            Vickki Hearing, M.D.   SEH/MEDQ  D:  03/04/2004  T:  03/04/2004  Job:  [id number]

## 2011-03-05 ENCOUNTER — Other Ambulatory Visit: Payer: Self-pay | Admitting: Gastroenterology

## 2011-03-05 LAB — CBC WITH DIFFERENTIAL/PLATELET
Eosinophils Absolute: 0.2 10*3/uL (ref 0.0–0.7)
Eosinophils Relative: 2 % (ref 0–5)
Hemoglobin: 11.8 g/dL — ABNORMAL LOW (ref 12.0–15.0)
Lymphocytes Relative: 30 % (ref 12–46)
Lymphs Abs: 3.2 10*3/uL (ref 0.7–4.0)
MCH: 23.6 pg — ABNORMAL LOW (ref 26.0–34.0)
MCV: 73.5 fL — ABNORMAL LOW (ref 78.0–100.0)
Monocytes Relative: 8 % (ref 3–12)
Neutrophils Relative %: 61 % (ref 43–77)
Platelets: 208 10*3/uL (ref 150–400)
RBC: 5.01 MIL/uL (ref 3.87–5.11)
WBC: 10.9 10*3/uL — ABNORMAL HIGH (ref 4.0–10.5)

## 2011-06-15 ENCOUNTER — Encounter: Payer: Self-pay | Admitting: Gastroenterology

## 2011-06-28 ENCOUNTER — Telehealth: Payer: Self-pay

## 2011-06-28 NOTE — Telephone Encounter (Signed)
CALL PT. SHE SHOULD USE MIRALAX 17 GMS PO Q1H FOR 4 HOURS ON 10/8. REPEAT ON 10/9 IF NO RESULTS.

## 2011-06-28 NOTE — Telephone Encounter (Signed)
Pt called to say she has been constipated. Did not have a BM yesterday. When she had one this AM it was very hard and she had to help with her finger. When she wiped, there was red blood on tissue paper. Thinks she might have hemorrhoids. Has OV tomorrow at 4:00 pm with Dr. Darrick Penna.

## 2011-06-28 NOTE — Telephone Encounter (Signed)
Pt informed

## 2011-06-29 ENCOUNTER — Ambulatory Visit (INDEPENDENT_AMBULATORY_CARE_PROVIDER_SITE_OTHER): Payer: No Typology Code available for payment source | Admitting: Gastroenterology

## 2011-06-29 ENCOUNTER — Encounter: Payer: Self-pay | Admitting: Gastroenterology

## 2011-06-29 VITALS — BP 125/70 | HR 78 | Temp 97.0°F | Ht 69.0 in | Wt 189.0 lb

## 2011-06-29 DIAGNOSIS — K625 Hemorrhage of anus and rectum: Secondary | ICD-10-CM

## 2011-06-29 DIAGNOSIS — K5900 Constipation, unspecified: Secondary | ICD-10-CM

## 2011-06-29 DIAGNOSIS — Z1211 Encounter for screening for malignant neoplasm of colon: Secondary | ICD-10-CM

## 2011-06-29 NOTE — Progress Notes (Signed)
Reminder in epic to follow up in 6 months/E30 visit

## 2011-06-29 NOTE — Progress Notes (Signed)
Cc to PCP 

## 2011-06-29 NOTE — Assessment & Plan Note (Signed)
TCS IN 10-15 YEARS IF BENEFITS OUTWEIGH THE RISKS 

## 2011-06-29 NOTE — Progress Notes (Signed)
  Subjective:    Patient ID: Katherine Joseph, female    DOB: 1935/05/23, 76 y.o.   MRN: [id number]  PCP: [facility]  HPI Eats apples, & yogurt qod. Eats bananas. Bms: move qd and sometimes qod. Sat: Broccoli and rice MON: straining to help it along. At last saw blood when she wiped. Took Miralax q1h for 4 hours. Took some more this AM. Hard BM this am and then soft. Burns at night legs, back, and sides. Has to sleep in the nude. Saw Dr. Gerilyn Pilgrim for workup: MRI, EMG-no source identified. Take ALive MVI every day. GOING TO HEALTH FOOD STORE re: BURNING SENSATION. Needs Align samples.  Past Medical History  Diagnosis Date  . GERD (gastroesophageal reflux disease)   . Hyperlipidemia   . Hypertension   . IBS (irritable bowel syndrome)   . Fluttering muscles SEP 2009    MBS/BASW NL  . Hemorrhoids, internal OCT 2011    Past Surgical History  Procedure Date  . Esophagogastroduodenoscopy 2006    W/DILATION W/RMR  . Right foot surgery   . Colonoscopy 2004    RMR/NUR INFLAMMATORY POLYP, POST POLYPECTOMY BLEED  . Colonoscopy oct 2011 SLF nvd-WEIGHT LOSS    NL TI, [place]/DC TICS     Review of Systems     Objective:   Physical Exam  Vitals reviewed. Constitutional: She is oriented to person, place, and time. She appears well-developed and well-nourished.  HENT:  Head: Normocephalic and atraumatic.  Cardiovascular: Normal rate, regular rhythm and normal heart sounds.   Pulmonary/Chest: Effort normal and breath sounds normal.  Abdominal: Soft. Bowel sounds are normal. She exhibits no distension. There is no tenderness.  Neurological: She is alert and oriented to person, place, and time.       NO FOCAL DEFICITS  Psychiatric: She has a normal mood and affect.          Assessment & Plan:

## 2011-06-29 NOTE — Patient Instructions (Addendum)
YOUR RECTAL BLEEDING IS MOST LIKELY FROM HEMORRHOIDS. SEE HANDOUT BELOW. CONTINUE PUTTING FIBER IN YOUR DIET. Continue Align and omeprazole (PRILOSEC)daily. FOLLOW UP IN 6 MOS.  Hemorrhoids Hemorrhoids are dilated (enlarged) veins around the rectum. Sometimes clots will form in the veins. This makes them swollen and painful. These are called thrombosed hemorrhoids.  Causes of hemorrhoids include:  Constipation.   Straining to have a bowel movement.   HEAVY LIFTING   HOME CARE INSTRUCTIONS  Eat a well balanced diet and drink 6 to 8 glasses of water every day to avoid constipation. You may also use a bulk laxative.   Avoid straining to have bowel movements.   Keep anal area dry and clean.   Do not use a donut shaped pillow or sit on the toilet for long periods. This increases blood pooling and pain.   Move your bowels when your body has the urge; this will require less straining and will decrease pain and pressure.

## 2011-06-30 DIAGNOSIS — K5900 Constipation, unspecified: Secondary | ICD-10-CM | POA: Insufficient documentation

## 2011-06-30 DIAGNOSIS — K625 Hemorrhage of anus and rectum: Secondary | ICD-10-CM | POA: Insufficient documentation

## 2011-06-30 NOTE — Assessment & Plan Note (Signed)
YOUR RECTAL BLEEDING IS MOST LIKELY FROM HEMORRHOIDS. SEE HANDOUT BELOW. CONTINUE PUTTING FIBER IN YOUR DIET. Continue Align and omeprazole (PRILOSEC)daily. SAMPLES #28 GIVEN & COUPONS. FOLLOW UP IN 6 MOS.

## 2011-06-30 NOTE — Assessment & Plan Note (Signed)
SEE HANDOUT BELOW. CONTINUE PUTTING FIBER IN YOUR DIET. Continue Align and omeprazole (PRILOSEC)daily. FOLLOW UP IN 6 MOS.

## 2011-08-09 ENCOUNTER — Other Ambulatory Visit (HOSPITAL_COMMUNITY): Payer: Self-pay | Admitting: Family Medicine

## 2011-08-09 DIAGNOSIS — Z139 Encounter for screening, unspecified: Secondary | ICD-10-CM

## 2011-08-16 ENCOUNTER — Ambulatory Visit (HOSPITAL_COMMUNITY)
Admission: RE | Admit: 2011-08-16 | Discharge: 2011-08-16 | Disposition: A | Discharge status: Home / Self Care | Payer: No Typology Code available for payment source | Source: Ambulatory Visit | Attending: Family Medicine | Admitting: Family Medicine

## 2011-08-16 DIAGNOSIS — Z139 Encounter for screening, unspecified: Secondary | ICD-10-CM

## 2011-08-16 DIAGNOSIS — Z1231 Encounter for screening mammogram for malignant neoplasm of breast: Secondary | ICD-10-CM | POA: Insufficient documentation

## 2011-08-18 ENCOUNTER — Other Ambulatory Visit (HOSPITAL_COMMUNITY): Payer: Self-pay | Admitting: Family Medicine

## 2011-08-18 ENCOUNTER — Ambulatory Visit (HOSPITAL_COMMUNITY)
Admission: RE | Admit: 2011-08-18 | Discharge: 2011-08-18 | Disposition: A | Discharge status: Home / Self Care | Payer: No Typology Code available for payment source | Source: Ambulatory Visit | Attending: Family Medicine | Admitting: Family Medicine

## 2011-08-18 DIAGNOSIS — M25559 Pain in unspecified hip: Secondary | ICD-10-CM | POA: Insufficient documentation

## 2011-08-18 DIAGNOSIS — M545 Low back pain, unspecified: Secondary | ICD-10-CM | POA: Insufficient documentation

## 2011-08-18 DIAGNOSIS — M899 Disorder of bone, unspecified: Secondary | ICD-10-CM | POA: Insufficient documentation

## 2011-08-18 DIAGNOSIS — M549 Dorsalgia, unspecified: Secondary | ICD-10-CM

## 2011-08-18 DIAGNOSIS — M5380 Other specified dorsopathies, site unspecified: Secondary | ICD-10-CM | POA: Insufficient documentation

## 2011-08-19 ENCOUNTER — Telehealth: Payer: Self-pay | Admitting: Gastroenterology

## 2011-08-19 NOTE — Telephone Encounter (Signed)
Used restroom and had a bowel movement and seen some blood shes asking for advice?

## 2011-08-19 NOTE — Telephone Encounter (Signed)
Pt called back. Wants to let Dr. Darrick Penna know that she is taking the Miralax once daily and her stools have not been hard. She is very concerned about taking the Colace tid in addition to the Miralax daily. Please advise!

## 2011-08-19 NOTE — Telephone Encounter (Signed)
Pt informed. Hemorrhoid hand out mailed.

## 2011-08-19 NOTE — Telephone Encounter (Signed)
PLEASE CALL PT. OK TO HOLD OFF ON COLACE IF STOOLS ARE COMING OUT SOFT AND EASY.

## 2011-08-19 NOTE — Telephone Encounter (Signed)
Please call pt. She should avoid constipation, & soften her stool. Use Prep h pr qid for 7 days and Colace 100 mg tid for 7 days. Call in 7 days if no better and we can prescribe suppositories. Send HO on hemorrhoids.  Hemorrhoids Causes of hemorrhoids include:  Constipation.   Straining to have a bowel movement.   HEAVY LIFTING   HOME CARE INSTRUCTIONS  Eat a well balanced diet and drink 6 to 8 glasses of water every day to avoid constipation. You may also use a bulk laxative.   Avoid straining to have bowel movements.   Keep anal area dry and clean.   Do not use a donut shaped pillow or sit on the toilet for long periods. This increases blood pooling and pain.   Move your bowels when your body has the urge; this will require less straining and will decrease pain and pressure.

## 2011-08-19 NOTE — Telephone Encounter (Signed)
LMOM for pt that it is OK to hold the colace if the stools are coming out soft and easy, and call if she has any more questions.

## 2011-12-21 ENCOUNTER — Encounter: Payer: Self-pay | Admitting: Gastroenterology

## 2012-01-27 ENCOUNTER — Ambulatory Visit (INDEPENDENT_AMBULATORY_CARE_PROVIDER_SITE_OTHER): Payer: Medicare Other | Admitting: Gastroenterology

## 2012-01-27 ENCOUNTER — Encounter: Payer: Self-pay | Admitting: Gastroenterology

## 2012-01-27 VITALS — BP 111/69 | HR 95 | Temp 97.4°F | Ht 69.0 in | Wt 189.4 lb

## 2012-01-27 DIAGNOSIS — R1319 Other dysphagia: Secondary | ICD-10-CM

## 2012-01-27 DIAGNOSIS — N289 Disorder of kidney and ureter, unspecified: Secondary | ICD-10-CM

## 2012-01-27 DIAGNOSIS — K589 Irritable bowel syndrome without diarrhea: Secondary | ICD-10-CM

## 2012-01-27 DIAGNOSIS — K219 Gastro-esophageal reflux disease without esophagitis: Secondary | ICD-10-CM

## 2012-01-27 NOTE — Assessment & Plan Note (Signed)
SX CONTROLLED.  CONTINUE OMP. OPV IN 6 MOS. 

## 2012-01-27 NOTE — Progress Notes (Signed)
  Subjective:    Patient ID: Katherine Joseph, female    DOB: [birth year], 77 y.o.   MRN: [id number]  PCP: [facility]  HPI BOWELS MOVE QD OR QOD. PAIN IN BELLY ABOUT THE SAME. FEELS LIKE HER MUSCLES HURT. LIPITOR ON HOLD. NO HEARTBURN. RARE FEEL;S FLUTTERING.   Past Medical History  Diagnosis Date  . GERD (gastroesophageal reflux disease)   . Hyperlipidemia   . Hypertension   . IBS (irritable bowel syndrome)   . Fluttering muscles SEP 2009    MBS/BASW NL  . Hemorrhoids, internal OCT 2011    Past Surgical History  Procedure Date  . Esophagogastroduodenoscopy 2006    W/DILATION W/RMR  . Right foot surgery   . Colonoscopy 2004    RMR/NUR INFLAMMATORY POLYP, POST POLYPECTOMY BLEED  . Colonoscopy oct 2011 SLF nvd-WEIGHT LOSS    NL TI, [place]/DC TICS    Allergies  Allergen Reactions  . Iodinated Diagnostic Agents Rash   Current Outpatient Prescriptions  Medication Sig Dispense Refill  . aspirin 81 MG tablet Take 81 mg by mouth daily.        Marland Kitchen atorvastatin (LIPITOR) 20 MG tablet Take 20 mg by mouth daily.        . chlordiazePOXIDE (LIBRIUM) 5 MG capsule Take 5 mg by mouth 1 dose over 46 hours.        . divalproex (DEPAKOTE) 250 MG 24 hr tablet Take 1,000 mg by mouth at bedtime.        . hydrocodone-acetaminophen (LORCET PLUS) 7.5-650 MG per tablet Take 1 tablet by mouth every 6 (six) hours as needed.        Marland Kitchen lisinopril-hydrochlorothiazide (PRINZIDE,ZESTORETIC) 20-12.5 MG per tablet Take 1 tablet by mouth daily.        . metoprolol tartrate (LOPRESSOR) 25 MG tablet Take 25 mg by mouth 2 (two) times daily. 1/2 TAB TWICE DAILY       . omeprazole (PRILOSEC) 20 MG capsule Take 20 mg by mouth daily.        . polyethylene glycol (MIRALAX / GLYCOLAX) packet Take 17 g by mouth daily. Takes every am or every other day      . potassium chloride SA (KLOR-CON M20) 20 MEQ tablet Take 20 mEq by mouth daily.        Marland Kitchen torsemide (DEMADEX) 100 MG tablet Take 100 mg by mouth daily. 1/2 TABLET DAILY             Review of Systems     Objective:   Physical Exam  Vitals reviewed. Constitutional: She is oriented to person, place, and time. No distress.  HENT:  Head: Normocephalic and atraumatic.  Mouth/Throat: Oropharynx is clear and moist. No oropharyngeal exudate.  Eyes: Pupils are equal, round, and reactive to light. No scleral icterus.  Neck: Normal range of motion. Neck supple.  Cardiovascular: Normal rate and normal heart sounds.   Pulmonary/Chest: Effort normal and breath sounds normal. No respiratory distress.  Abdominal: Soft. Bowel sounds are normal. She exhibits no distension. There is no tenderness.  Musculoskeletal: She exhibits edema (TRACE BIL LE).  Lymphadenopathy:    She has no cervical adenopathy.  Neurological: She is alert and oriented to person, place, and time.       NO  NEW FOCAL DEFICITS   Psychiatric: She has a normal mood and affect.          Assessment & Plan:

## 2012-01-27 NOTE — Patient Instructions (Signed)
CONTINUE ALIGN, AND OMEPRAZOLE.  USE MIRALAX AS NEEDED.  FOLLOW UP IN 6 MOS.

## 2012-01-27 NOTE — Assessment & Plan Note (Signed)
RARE SX

## 2012-01-27 NOTE — Progress Notes (Signed)
Faxed to PCP

## 2012-01-27 NOTE — Assessment & Plan Note (Signed)
SX FAIRLY WELL CONTROLLED.  CONTINUE ALIGN. MIRALAX PRN. OPV IN 6 MOS.

## 2012-02-07 NOTE — Progress Notes (Signed)
Reminder in epic to follow up in 6 months °

## 2012-07-12 ENCOUNTER — Encounter: Payer: Self-pay | Admitting: Gastroenterology

## 2012-07-25 ENCOUNTER — Other Ambulatory Visit (HOSPITAL_COMMUNITY): Payer: Self-pay | Admitting: Family Medicine

## 2012-07-25 DIAGNOSIS — Z139 Encounter for screening, unspecified: Secondary | ICD-10-CM

## 2012-08-09 ENCOUNTER — Encounter: Payer: Self-pay | Admitting: Gastroenterology

## 2012-08-09 ENCOUNTER — Ambulatory Visit (INDEPENDENT_AMBULATORY_CARE_PROVIDER_SITE_OTHER): Payer: Medicare Other | Admitting: Gastroenterology

## 2012-08-09 VITALS — BP 126/73 | HR 79 | Temp 98.4°F | Ht 69.0 in | Wt 189.0 lb

## 2012-08-09 DIAGNOSIS — R634 Abnormal weight loss: Secondary | ICD-10-CM

## 2012-08-09 DIAGNOSIS — K219 Gastro-esophageal reflux disease without esophagitis: Secondary | ICD-10-CM

## 2012-08-09 DIAGNOSIS — K625 Hemorrhage of anus and rectum: Secondary | ICD-10-CM

## 2012-08-09 DIAGNOSIS — R1319 Other dysphagia: Secondary | ICD-10-CM

## 2012-08-09 DIAGNOSIS — K589 Irritable bowel syndrome without diarrhea: Secondary | ICD-10-CM

## 2012-08-09 MED ORDER — OMEPRAZOLE 20 MG PO CPDR: 20.0000 mg | DELAYED_RELEASE_CAPSULE | Freq: Every day | ORAL | Status: DC

## 2012-08-09 NOTE — Assessment & Plan Note (Signed)
RESOLVED. 

## 2012-08-09 NOTE — Progress Notes (Signed)
Faxed to PCP

## 2012-08-09 NOTE — Assessment & Plan Note (Signed)
RESOLVED. WEIGHT STABLE SINCE 2012.

## 2012-08-09 NOTE — Progress Notes (Signed)
Subjective:    Patient ID: Katherine Joseph, female    DOB: 04/07/1935, 77 y.o.   MRN: [id number]  PCP: Sudie Bailey  HPI Still having pain in right flank. C/o PAIN EVERYWHERE. STOP TAKING CELEBREX DUE TO POTENTIAL SIDE EFFECT. FEELS A JUMPING ON ABD THAT GETS BETTER WITH HOLDING IT. APPETITE: GOOD. DEPAKOTE WORKING FOR TWITCHING IN HER HEAD. USING MIRALAX-?3-4 TIMES A DAY. BMS: EVERY DAY. CONCERNED ABOUT FUNGUS ON HER FOOT.  PT DENIES FEVER, CHILLS, BRBPR, nausea, vomiting, melena, diarrhea, constipation, problems swallowing, heartburn or indigestion.    Past Medical History  Diagnosis Date  . GERD (gastroesophageal reflux disease)   . Hyperlipidemia   . Hypertension   . IBS (irritable bowel syndrome)   . Fluttering muscles SEP 2009    MBS/BASW NL  . Hemorrhoids, internal OCT 2011    Past Surgical History  Procedure Date  . Esophagogastroduodenoscopy 2006    W/DILATION W/RMR  . Right foot surgery   . Colonoscopy 2004    RMR/NUR INFLAMMATORY POLYP, POST POLYPECTOMY BLEED  . Colonoscopy oct 2011 SLF nvd-WEIGHT LOSS    NL TI, [place]/DC TICS   Allergies  Allergen Reactions  . Iodinated Diagnostic Agents Rash    Current Outpatient Prescriptions  Medication Sig Dispense Refill  . aspirin 81 MG tablet Take 81 mg by mouth daily.        Marland Kitchen atorvastatin (LIPITOR) 20 MG tablet Take 20 mg by mouth daily.        . chlordiazePOXIDE (LIBRIUM) 5 MG capsule Take 5 mg by mouth 1 dose over 46 hours.        . divalproex (DEPAKOTE) 250 MG 24 hr tablet Take 1,000 mg by mouth at bedtime.        . hydrocodone-acetaminophen (LORCET PLUS) 7.5-650 MG per tablet Take 1 tablet by mouth every 6 (six) hours as needed.        Marland Kitchen lisinopril-hydrochlorothiazide (PRINZIDE,ZESTORETIC) 20-12.5 MG per tablet Take 1 tablet by mouth daily.        . metoprolol tartrate (LOPRESSOR) 25 MG tablet Take 25 mg by mouth 2 (two) times daily. 1/2 TAB TWICE DAILY       . omeprazole (PRILOSEC) 20 MG capsule Take 20 mg by mouth  daily.        . polyethylene glycol (MIRALAX / GLYCOLAX) packet Take 17 g by mouth daily. Takes every am or every other day      . potassium chloride SA (KLOR-CON M20) 20 MEQ tablet Take 20 mEq by mouth daily.        . Probiotic Product (PROBIOTIC FORMULA) CAPS Take by mouth. ALIGN 1 PO DAILY      . torsemide (DEMADEX) 100 MG tablet Take 100 mg by mouth daily. 1/2 TABLET DAILY            Review of Systems     Objective:   Physical Exam  Vitals reviewed. Constitutional: She is oriented to person, place, and time. She appears well-nourished. No distress.  HENT:  Head: Normocephalic and atraumatic.  Mouth/Throat: Oropharynx is clear and moist. No oropharyngeal exudate.  Eyes: Pupils are equal, round, and reactive to light. No scleral icterus.  Neck: Normal range of motion. Neck supple.  Cardiovascular: Normal rate, regular rhythm and normal heart sounds.   Pulmonary/Chest: Effort normal and breath sounds normal. No respiratory distress.  Abdominal: Soft. Bowel sounds are normal. She exhibits no distension. There is no tenderness.  Musculoskeletal: She exhibits edema.  Neurological: She is alert and oriented to person,  place, and time.       NO NEW FOCAL DEFICITS   Psychiatric:       FLAT AFFECT, NL MOOD           Assessment & Plan:

## 2012-08-09 NOTE — Assessment & Plan Note (Signed)
CONTROLLED. WEIGHT STABLE: BMI 27  CONTINUE OMEPRAZOLE. OPV IN 6 MOS.

## 2012-08-09 NOTE — Patient Instructions (Signed)
CONTINUE ALIGN. CHEAPER ALTERNATIVES MAY BE [employer] BRAND OR [facility] [facility].  USE MIRALAX TO KEEP STOOLS SOFT.  CONTINUE OMEPRAZOLE. I REFILLED YOUR OMEPRAZOLE FOR ONE YEAR.  FOLLOW UP IN 6 MOS.

## 2012-08-09 NOTE — Assessment & Plan Note (Signed)
CONTROLLED WITH MIRALAX AND PROBIOTIC. OPV IN 6 MOS

## 2012-08-15 NOTE — Progress Notes (Signed)
Reminder in epic to follow up in 6 months with SF °

## 2012-08-21 ENCOUNTER — Ambulatory Visit (HOSPITAL_COMMUNITY)
Admission: RE | Admit: 2012-08-21 | Discharge: 2012-08-21 | Disposition: A | Discharge status: Home / Self Care | Payer: Medicare Other | Source: Ambulatory Visit | Attending: Family Medicine | Admitting: Family Medicine

## 2012-08-21 DIAGNOSIS — Z139 Encounter for screening, unspecified: Secondary | ICD-10-CM

## 2012-08-21 DIAGNOSIS — Z1231 Encounter for screening mammogram for malignant neoplasm of breast: Secondary | ICD-10-CM | POA: Insufficient documentation

## 2012-12-18 ENCOUNTER — Encounter: Payer: Self-pay | Admitting: Obstetrics and Gynecology

## 2012-12-18 ENCOUNTER — Other Ambulatory Visit (HOSPITAL_COMMUNITY)
Admission: RE | Admit: 2012-12-18 | Discharge: 2012-12-18 | Disposition: A | Discharge status: Home / Self Care | Payer: Medicare Other | Source: Ambulatory Visit | Attending: Obstetrics and Gynecology | Admitting: Obstetrics and Gynecology

## 2012-12-18 ENCOUNTER — Other Ambulatory Visit: Payer: Self-pay | Admitting: Obstetrics and Gynecology

## 2012-12-18 ENCOUNTER — Ambulatory Visit (INDEPENDENT_AMBULATORY_CARE_PROVIDER_SITE_OTHER): Payer: Medicare Other | Admitting: Obstetrics and Gynecology

## 2012-12-18 VITALS — BP 138/80 | Ht 68.0 in | Wt 183.0 lb

## 2012-12-18 DIAGNOSIS — Z0000 Encounter for general adult medical examination without abnormal findings: Secondary | ICD-10-CM

## 2012-12-18 DIAGNOSIS — Z01419 Encounter for gynecological examination (general) (routine) without abnormal findings: Secondary | ICD-10-CM | POA: Insufficient documentation

## 2012-12-18 DIAGNOSIS — Z1151 Encounter for screening for human papillomavirus (HPV): Secondary | ICD-10-CM | POA: Insufficient documentation

## 2012-12-18 DIAGNOSIS — Z1212 Encounter for screening for malignant neoplasm of rectum: Secondary | ICD-10-CM

## 2012-12-18 LAB — HEMOCCULT GUIAC POC 1CARD (OFFICE)

## 2012-12-18 NOTE — Patient Instructions (Signed)
Please notify us of any future problems. We will not schedule routine followup will be seeing you as needed for problems

## 2012-12-18 NOTE — Addendum Note (Signed)
Addended by: Criss Alvine on: 12/18/2012 12:29 PM   Modules accepted: Orders

## 2012-12-18 NOTE — Progress Notes (Signed)
Subjective:     Katherine Joseph is a 78 y.o. female here for a routine exam.  Current complaints: none.  Exam last done 2 yr ago.   Gynecologic History No LMP recorded. Patient is postmenopausal. Contraception: post menopausal status Last Pap: 2012. Results were: normal Last mammogram: 2013. Results were: normal  Obstetric History OB History   Grav Para Term Preterm Abortions TAB SAB Ect Mult Living                     Review of Systems Gastrointestinal: negative Genitourinary:negative    Objective:    BP 138/80  Ht 5\' 8"  (1.727 m)  Wt 183 lb (83.008 kg)  BMI 27.83 kg/m2  General Appearance:    Alert, cooperative, no distress, appears stated age              Throat:   Lips, mucosa, and tongue normal; teeth and gums normal  Neck:   Supple, symmetrical, trachea midline, no adenopathy;    thyroid:  no enlargement/tenderness/nodules; no carotid   bruit or JVD  Back:     Symmetric, no curvature, ROM normal, no CVA tenderness  Lungs:     Clear to auscultation bilaterally, respirations unlabored  Chest Wall:    No tenderness or deformity   Heart:    Regular rate and rhythm, S1 and S2 normal, no murmur, rub   or gallop  Breast Exam:    No tenderness, masses, or nipple abnormality  Abdomen:     Soft, non-tender, bowel sounds active all four quadrants,    no masses, no organomegaly  Genitalia:    Normal female without lesion, discharge or tenderness  Rectal:    Normal tone, normal prostate, no masses or tenderness;   guaiac negative stool  Extremities:   Extremities normal, atraumatic, no cyanosis or edema  Pulses:   2+ and symmetric all extremities  Skin:   Skin color, texture, turgor normal, no rashes or lesions  Lymph nodes:   Cervical, supraclavicular, and axillary nodes normal  Neurologic:   CNII-XII intact, normal strength, sensation and reflexes    throughout      Assessment:    Healthy female exam.  postmenopausal status   Plan:    Given the patient's  age of 13, exams will be to 5 years or further, primarily symptomatic to

## 2013-02-09 ENCOUNTER — Encounter: Payer: Self-pay | Admitting: Gastroenterology

## 2013-04-25 ENCOUNTER — Other Ambulatory Visit: Payer: Self-pay

## 2013-05-16 ENCOUNTER — Ambulatory Visit (INDEPENDENT_AMBULATORY_CARE_PROVIDER_SITE_OTHER): Payer: Medicare Other

## 2013-05-16 ENCOUNTER — Ambulatory Visit (INDEPENDENT_AMBULATORY_CARE_PROVIDER_SITE_OTHER): Payer: Medicare Other | Admitting: Orthopedic Surgery

## 2013-05-16 ENCOUNTER — Encounter: Payer: Self-pay | Admitting: Orthopedic Surgery

## 2013-05-16 VITALS — BP 143/84 | Ht 69.0 in | Wt 188.0 lb

## 2013-05-16 DIAGNOSIS — G8929 Other chronic pain: Secondary | ICD-10-CM

## 2013-05-16 DIAGNOSIS — M4120 Other idiopathic scoliosis, site unspecified: Secondary | ICD-10-CM

## 2013-05-16 DIAGNOSIS — M25569 Pain in unspecified knee: Secondary | ICD-10-CM

## 2013-05-16 DIAGNOSIS — M25561 Pain in right knee: Secondary | ICD-10-CM

## 2013-05-16 DIAGNOSIS — M5410 Radiculopathy, site unspecified: Secondary | ICD-10-CM

## 2013-05-16 DIAGNOSIS — IMO0002 Reserved for concepts with insufficient information to code with codable children: Secondary | ICD-10-CM

## 2013-05-16 DIAGNOSIS — M419 Scoliosis, unspecified: Secondary | ICD-10-CM

## 2013-05-16 DIAGNOSIS — M25559 Pain in unspecified hip: Secondary | ICD-10-CM

## 2013-05-16 NOTE — Progress Notes (Signed)
Patient ID: Katherine Joseph, female   DOB: 11-13-1934, 78 y.o.   MRN: [id number]  Chief Complaint  Patient presents with  . Knee Pain    Right knee pain. Consult from Dr. Sudie Bailey    HISTORY: Pain right knee and left hip s/p rt knee arthroscopy   Gradual onset of 10/10 left hip pain. H/O scoliosis. The knee was hurting , gave way but stopped. C/o occasional pain radiating across hip into left leg   Denies numbness tingling   Review of Systems  Musculoskeletal: Positive for myalgias and joint pain.  All other systems reviewed and are negative.   BP 143/84  Ht 5\' 9"  (1.753 m)  Wt 188 lb (85.276 kg)  BMI 27.75 kg/m2 General appearance is normal, the patient is alert and oriented x3 with normal mood and affect.  Upper extremity exam  The right and left upper extremity:   Inspection and palpation revealed no abnormalities in the upper extremities.   Range of motion is full without contracture.  Motor exam is normal with grade 5 strength.  The joints are fully reduced without subluxation.  There is no atrophy or tremor and muscle tone is normal.  All joints are stable.   Back  Abnormal alignment with scoliosis in the lumbar spine tenderness over the left buttock. Mild tenderness over L4 and S1   Right knee  There is no evidence of swelling, joint line tenderness. Knee is stable muscle tone strength normal. Ligament stable. Skin intact. McMurray sign negative.  Left knee normal range of motion strength stability alignment, no effusion or tenderness  Cardiovascular exam is intact reflexes are 0 at the knee and ankle bilaterally straight leg raises balance is good although she uses a cane  X-rays of the knee show arthrosis  She's got a back x-ray from several years ago show scoliosis and degenerative disc disease  Since it is not bothering her we will not recommend any other treatment we will give her an intramuscular shot in her left hip to address her radicular  symptoms  Encounter Diagnoses  Name Primary?  . Right knee pain Yes  . Hip pain, chronic, left

## 2013-05-16 NOTE — Patient Instructions (Signed)
Scoliosis of the back causing left (hip) pain with pinched nerve  Knee arthritis : severe but ok for now

## 2013-07-26 ENCOUNTER — Other Ambulatory Visit: Payer: Self-pay

## 2013-08-06 ENCOUNTER — Other Ambulatory Visit (HOSPITAL_COMMUNITY): Payer: Self-pay | Admitting: Family Medicine

## 2013-08-06 DIAGNOSIS — Z139 Encounter for screening, unspecified: Secondary | ICD-10-CM

## 2013-08-23 ENCOUNTER — Ambulatory Visit (HOSPITAL_COMMUNITY)
Admission: RE | Admit: 2013-08-23 | Discharge: 2013-08-23 | Disposition: A | Discharge status: Home / Self Care | Payer: Medicare Other | Source: Ambulatory Visit | Attending: Family Medicine | Admitting: Family Medicine

## 2013-08-23 DIAGNOSIS — Z1231 Encounter for screening mammogram for malignant neoplasm of breast: Secondary | ICD-10-CM | POA: Insufficient documentation

## 2013-08-23 DIAGNOSIS — Z139 Encounter for screening, unspecified: Secondary | ICD-10-CM

## 2014-01-31 ENCOUNTER — Encounter: Payer: Self-pay | Admitting: Gastroenterology

## 2014-01-31 ENCOUNTER — Ambulatory Visit (INDEPENDENT_AMBULATORY_CARE_PROVIDER_SITE_OTHER): Payer: Managed Care, Other (non HMO) | Admitting: Gastroenterology

## 2014-01-31 VITALS — BP 122/75 | HR 79 | Temp 97.4°F | Ht 69.0 in | Wt 186.0 lb

## 2014-01-31 DIAGNOSIS — R634 Abnormal weight loss: Secondary | ICD-10-CM

## 2014-01-31 DIAGNOSIS — K219 Gastro-esophageal reflux disease without esophagitis: Secondary | ICD-10-CM

## 2014-01-31 DIAGNOSIS — K589 Irritable bowel syndrome without diarrhea: Secondary | ICD-10-CM

## 2014-01-31 NOTE — Patient Instructions (Signed)
CONTINUE PROBIOTIC DAILY.  DRINK WATER TO KEEP YOUR URINE LIGHT YELLOW.  FOLLOW A HIGH FIBER DIET. SEE INFO BELOW.  FOLLOW UP IN 6 MOS.    High-Fiber Diet A high-fiber diet changes your normal diet to include more whole grains, legumes, fruits, and vegetables. Changes in the diet involve replacing refined carbohydrates with unrefined foods. The calorie level of the diet is essentially unchanged. The Dietary Reference Intake (recommended amount) for adult males is 38 grams per day. For adult females, it is 25 grams per day. Pregnant and lactating women should consume 28 grams of fiber per day. Fiber is the intact part of a plant that is not broken down during digestion. Functional fiber is fiber that has been isolated from the plant to provide a beneficial effect in the body. PURPOSE  Increase stool bulk.   Ease and regulate bowel movements.   Lower cholesterol.  INDICATIONS THAT YOU NEED MORE FIBER  Constipation and hemorrhoids.   Uncomplicated diverticulosis (intestine condition) and irritable bowel syndrome.   Weight management.   As a protective measure against hardening of the arteries (atherosclerosis), diabetes, and cancer.   GUIDELINES FOR INCREASING FIBER IN THE DIET  Start adding fiber to the diet slowly. A gradual increase of about 5 more grams (2 slices of whole-wheat bread, 2 servings of most fruits or vegetables, or 1 bowl of high-fiber cereal) per day is best. Too rapid an increase in fiber may result in constipation, flatulence, and bloating.   Drink enough water and fluids to keep your urine clear or pale yellow. Water, juice, or caffeine-free drinks are recommended. Not drinking enough fluid may cause constipation.   Eat a variety of high-fiber foods rather than one type of fiber.   Try to increase your intake of fiber through using high-fiber foods rather than fiber pills or supplements that contain small amounts of fiber.   The goal is to change the types of  food eaten. Do not supplement your present diet with high-fiber foods, but replace foods in your present diet.  INCLUDE A VARIETY OF FIBER SOURCES  Replace refined and processed grains with whole grains, canned fruits with fresh fruits, and incorporate other fiber sources. White rice, white breads, and most bakery goods contain little or no fiber.   Brown whole-grain rice, buckwheat oats, and many fruits and vegetables are all good sources of fiber. These include: broccoli, Brussels sprouts, cabbage, cauliflower, beets, sweet potatoes, white potatoes (skin on), carrots, tomatoes, eggplant, squash, berries, fresh fruits, and dried fruits.   Cereals appear to be the richest source of fiber. Cereal fiber is found in whole grains and bran. Bran is the fiber-rich outer coat of cereal grain, which is largely removed in refining. In whole-grain cereals, the bran remains. In breakfast cereals, the largest amount of fiber is found in those with "bran" in their names. The fiber content is sometimes indicated on the label.   You may need to include additional fruits and vegetables each day.   In baking, for 1 cup white flour, you may use the following substitutions:   1 cup whole-wheat flour minus 2 tablespoons.   1/2 cup white flour plus 1/2 cup whole-wheat flour.

## 2014-01-31 NOTE — Assessment & Plan Note (Signed)
SX CONTROLLED WITH PROBIOTICS.  PROBIOTIC DAILY OPV IN 6 MOS

## 2014-01-31 NOTE — Progress Notes (Signed)
Subjective:    Patient ID: Katherine Joseph, female    DOB: 07/28/1935, 79 y.o.   MRN: [id number]  Robert Bellow, MD  HPI LAST SEEN NOV 2013. SEEING DR. HARRISON FOR ARTHRITIS IN HER KNEES. FEELS NAUSEATED WHEN SHE GETS UP EVERY DAY JUST ABOUT. HAPPENS AFTER SHE EATS. TAKES LORAZEPAM & FEELS BETTER.  NOT TAKING LIBRIUM ANYMORE BECAUSE IT WAS NOT ON HER FORMULARY.  BMs: PRETTY GOOD THIS AM,. MAY NOT MOVE EVERY DAY. MAY HAVE PAIN IN HER LOWER ABDOMEN(R SIDE). THINKS ITS DUE TO ARTHRITIS. NOT GETTING WORSE. NOTICES IT AT NIGHT.  STILL TAKING A PROBIOTIC. PT DENIES FEVER, CHILLS, BRBPR, vomiting, melena, diarrhea, constipation, abd pain, problems swallowing, OR heartburn or indigestion.  Past Medical History  Diagnosis Date  . GERD (gastroesophageal reflux disease)   . Hyperlipidemia   . Hypertension   . IBS (irritable bowel syndrome)   . Fluttering muscles SEP 2009    MBS/BASW NL  . Hemorrhoids, internal OCT 2011    Past Surgical History  Procedure Laterality Date  . Esophagogastroduodenoscopy  2006    W/DILATION W/RMR  . Right foot surgery    . Colonoscopy  2004    RMR/NUR INFLAMMATORY POLYP, POST POLYPECTOMY BLEED  . Colonoscopy  oct 2011 SLF nvd-WEIGHT LOSS    NL TI, [place]/DC TICS   Allergies  Allergen Reactions  . Iodinated Diagnostic Agents Rash   Current Outpatient Prescriptions  Medication Sig Dispense Refill  . aspirin 81 MG tablet Take 81 mg by mouth daily.        Marland Kitchen atorvastatin (LIPITOR) 20 MG tablet Take 20 mg by mouth daily.        . divalproex (DEPAKOTE) 250 MG 24 hr tablet Take 1,000 mg by mouth at bedtime.        . gabapentin (NEURONTIN) 300 MG capsule Take 300 mg by mouth 3 (three) times daily.      . hydrocodone-acetaminophen (LORCET PLUS) 7.5-650 MG per tablet Take 1 tablet by mouth every 6 (six) hours as needed.        Marland Kitchen lisinopril-hydrochlorothiazide (PRINZIDE,ZESTORETIC) 20-12.5 MG per tablet Take 1 tablet by mouth daily.        Marland Kitchen LORazepam (ATIVAN) 1  MG tablet Take 1 mg by mouth every 8 (eight) hours.      . metoprolol tartrate (LOPRESSOR) 25 MG tablet Take 25 mg by mouth 2 (two) times daily. 1/2 TAB TWICE DAILY     . omeprazole (PRILOSEC) 20 MG capsule Take 1 capsule (20 mg total) by mouth daily.    . polyethylene glycol (MIRALAX / GLYCOLAX) packet Take 17 g by mouth daily. Takes every am or every other day      . potassium chloride SA (KLOR-CON M20) 20 MEQ tablet Take 20 mEq by mouth daily.        . Probiotic Product (PROBIOTIC FORMULA) CAPS  1 PO DAILY  USES AN OFF BRAND    . torsemide (DEMADEX) 100 MG tablet Take 100 mg by mouth daily. 1/2 TABLET DAILY          Review of Systems     Objective:   Physical Exam  Vitals reviewed. Constitutional: She is oriented to person, place, and time. She appears well-nourished. No distress.  HENT:  Head: Normocephalic and atraumatic.  Mouth/Throat: Oropharynx is clear and moist. No oropharyngeal exudate.  Eyes: Pupils are equal, round, and reactive to light. No scleral icterus.  Neck: Normal range of motion. Neck supple.  Cardiovascular: Normal rate, regular rhythm  and normal heart sounds.   Pulmonary/Chest: Effort normal and breath sounds normal. No respiratory distress.  Abdominal: Soft. Bowel sounds are normal. She exhibits no distension. There is no tenderness.  Musculoskeletal: She exhibits no edema.  Lymphadenopathy:    She has no cervical adenopathy.  Neurological: She is alert and oriented to person, place, and time.  NO  NEW FOCAL DEFICITS   Psychiatric:  SLIGHTLY ANXIOUS MOOD, NL AFFECT          Assessment & Plan:

## 2014-01-31 NOTE — Progress Notes (Signed)
Reminder in epic °

## 2014-01-31 NOTE — Assessment & Plan Note (Signed)
WEIGHT DOWN 3 LBS SINCE 2013.  CONTINUE TO MONITOR SYMPTOMS.

## 2014-01-31 NOTE — Assessment & Plan Note (Signed)
SX CONTROLLED WITH OMEPRAZOLE DAILY.  CONTINUE TO MONITOR SYMPTOMS. OPV IN 6 MOS

## 2014-02-04 NOTE — Progress Notes (Signed)
cc'd to pcp 

## 2014-02-14 ENCOUNTER — Ambulatory Visit (INDEPENDENT_AMBULATORY_CARE_PROVIDER_SITE_OTHER): Payer: Managed Care, Other (non HMO) | Admitting: Orthopedic Surgery

## 2014-02-14 ENCOUNTER — Ambulatory Visit (INDEPENDENT_AMBULATORY_CARE_PROVIDER_SITE_OTHER): Payer: Managed Care, Other (non HMO)

## 2014-02-14 ENCOUNTER — Encounter: Payer: Self-pay | Admitting: Orthopedic Surgery

## 2014-02-14 VITALS — BP 136/80 | Ht 69.0 in | Wt 186.0 lb

## 2014-02-14 DIAGNOSIS — M25561 Pain in right knee: Secondary | ICD-10-CM

## 2014-02-14 DIAGNOSIS — M1711 Unilateral primary osteoarthritis, right knee: Secondary | ICD-10-CM

## 2014-02-14 DIAGNOSIS — IMO0002 Reserved for concepts with insufficient information to code with codable children: Secondary | ICD-10-CM

## 2014-02-14 DIAGNOSIS — M25569 Pain in unspecified knee: Secondary | ICD-10-CM

## 2014-02-14 DIAGNOSIS — M1710 Unilateral primary osteoarthritis, unspecified knee: Secondary | ICD-10-CM

## 2014-02-14 MED ORDER — DICLOFENAC POTASSIUM 50 MG PO TABS: 50.0000 mg | ORAL_TABLET | Freq: Two times a day (BID) | ORAL | Status: DC

## 2014-02-14 NOTE — Patient Instructions (Signed)
Pick up medication at [facility] have received a steroid shot. 15% of patients experience increased pain at the injection site with in the next 24 hours. This is best treated with ice and tylenol extra strength 2 tabs every 8 hours. If you are still having pain please call the office.

## 2014-02-14 NOTE — Progress Notes (Signed)
Patient ID: Katherine Joseph, female   DOB: [birth year], 79 y.o.   MRN: [id number]  Chief Complaint  Patient presents with  . Knee Pain    Right knee pain    79 year old female complains of pain all over her body but today would like her right knee evaluated. She complains of aching diffuse knee pain without any mechanical symptoms she does have associated symptoms of stiffness and swelling  Her review of systems does not indicate any neurologic complaints and she has general muscle ache and joint ache  Past Medical History  Diagnosis Date  . GERD (gastroesophageal reflux disease)   . Hyperlipidemia   . Hypertension   . IBS (irritable bowel syndrome)   . Fluttering muscles SEP 2009    MBS/BASW NL  . Hemorrhoids, internal OCT 2011   Vital signs: BP 136/80  Ht 5\' 9"  (1.753 m)  Wt 186 lb (84.369 kg)  BMI 27.45 kg/m2   General the patient is well-developed and well-nourished grooming and hygiene are normal Oriented x3 Mood and affect normal Ambulation supported by a cane no significant limp Inspection of the right knee shows no major deformity or passive range of motion is near normal her ligaments are stable motor exam is intact she has no swelling of the joint she has mild lateral joint line tenderness negative McMurray's skin is clean dry and intact hip motion is normal she has good distal pulses and normal sensation.   Reordered x-rays the x-rays are read as osteoarthritis moderate   Encounter Diagnoses  Name Primary?  . Right knee pain   . Osteoarthritis of right knee Yes   Injection RIGHT knee Verbal consent and timeout were completed for a RIGHT injection Under sterile conditions the RIGHT knee was injected from a lateral approach.  Medication depomedrol 40 mg and Lidocaine plain 3 cc   A sterile dressing was applied there were no complications  Meds ordered this encounter  Medications  . diclofenac (CATAFLAM) 50 MG tablet    Sig: Take 1 tablet (50 mg total) by  mouth 2 (two) times daily.    Dispense:  90 tablet    Refill:  3

## 2014-06-20 ENCOUNTER — Other Ambulatory Visit: Payer: Self-pay | Admitting: Neurology

## 2014-06-20 ENCOUNTER — Ambulatory Visit (HOSPITAL_COMMUNITY)
Admission: RE | Admit: 2014-06-20 | Discharge: 2014-06-20 | Disposition: A | Discharge status: Home / Self Care | Payer: Medicare HMO | Source: Ambulatory Visit | Attending: Neurology | Admitting: Neurology

## 2014-06-20 DIAGNOSIS — H5712 Ocular pain, left eye: Secondary | ICD-10-CM

## 2014-07-02 ENCOUNTER — Encounter: Payer: Self-pay | Admitting: Gastroenterology

## 2014-07-05 ENCOUNTER — Other Ambulatory Visit: Payer: Self-pay

## 2014-08-19 ENCOUNTER — Other Ambulatory Visit (HOSPITAL_COMMUNITY): Payer: Self-pay | Admitting: Family Medicine

## 2014-08-19 DIAGNOSIS — Z1231 Encounter for screening mammogram for malignant neoplasm of breast: Secondary | ICD-10-CM

## 2014-08-28 ENCOUNTER — Ambulatory Visit (HOSPITAL_COMMUNITY)
Admission: RE | Admit: 2014-08-28 | Discharge: 2014-08-28 | Disposition: A | Discharge status: Home / Self Care | Payer: Medicare HMO | Source: Ambulatory Visit | Attending: Family Medicine | Admitting: Family Medicine

## 2014-08-28 DIAGNOSIS — Z1231 Encounter for screening mammogram for malignant neoplasm of breast: Secondary | ICD-10-CM | POA: Insufficient documentation

## 2014-08-29 ENCOUNTER — Ambulatory Visit (INDEPENDENT_AMBULATORY_CARE_PROVIDER_SITE_OTHER): Payer: Managed Care, Other (non HMO) | Admitting: Gastroenterology

## 2014-08-29 ENCOUNTER — Encounter: Payer: Self-pay | Admitting: Gastroenterology

## 2014-08-29 VITALS — BP 119/68 | HR 76 | Temp 97.1°F | Ht 69.0 in | Wt 183.4 lb

## 2014-08-29 DIAGNOSIS — K589 Irritable bowel syndrome without diarrhea: Secondary | ICD-10-CM

## 2014-08-29 DIAGNOSIS — D509 Iron deficiency anemia, unspecified: Secondary | ICD-10-CM

## 2014-08-29 DIAGNOSIS — K5901 Slow transit constipation: Secondary | ICD-10-CM

## 2014-08-29 DIAGNOSIS — Z1211 Encounter for screening for malignant neoplasm of colon: Secondary | ICD-10-CM

## 2014-08-29 DIAGNOSIS — K219 Gastro-esophageal reflux disease without esophagitis: Secondary | ICD-10-CM

## 2014-08-29 MED ORDER — OMEPRAZOLE 20 MG PO CPDR: 20.0000 mg | DELAYED_RELEASE_CAPSULE | Freq: Every day | ORAL | Status: DC

## 2014-08-29 NOTE — Patient Instructions (Signed)
TAKE ALIGN DAILY. YOU CAN CALL OR STOP BY THE OFFICE FOR SAMPLES.  CONTINUE OMEPRAZOLE.  TAKE 30 MINUTES PRIOR TO YOUR FIRST MEAL.  FOLLOW A LOW FAT DIET. SEE INFO BELOW.  USE MIRALAX AS NEEDED TO PREVENT CONSTIPATION.  FOLLOW UP IN 1 YEAR.   Low-Fat Diet BREADS, CEREALS, PASTA, RICE, DRIED PEAS, AND BEANS These products are high in carbohydrates and most are low in fat. Therefore, they can be increased in the diet as substitutes for fatty foods. They too, however, contain calories and should not be eaten in excess. Cereals can be eaten for snacks as well as for breakfast.   FRUITS AND VEGETABLES It is good to eat fruits and vegetables. Besides being sources of fiber, both are rich in vitamins and some minerals. They help you get the daily allowances of these nutrients. Fruits and vegetables can be used for snacks and desserts.  MEATS Limit lean meat, chicken, [place], and fish to no more than 6 ounces per day. Beef, Pork, and Lamb Use lean cuts of beef, pork, and lamb. Lean cuts include:  Extra-lean ground beef.  Arm roast.  Sirloin tip.  Center-cut ham.  Round steak.  Loin chops.  Rump roast.  Tenderloin.  Trim all fat off the outside of meats before cooking. It is not necessary to severely decrease the intake of red meat, but lean choices should be made. Lean meat is rich in protein and contains a highly absorbable form of iron. Premenopausal women, in particular, should avoid reducing lean red meat because this could increase the risk for low red blood cells (iron-deficiency anemia).  Chicken and [place] These are good sources of protein. The fat of poultry can be reduced by removing the skin and underlying fat layers before cooking. Chicken and [place] can be substituted for lean red meat in the diet. Poultry should not be fried or covered with high-fat sauces. Fish and Shellfish Fish is a good source of protein. Shellfish contain cholesterol, but they usually are low in saturated  fatty acids. The preparation of fish is important. Like chicken and [place], they should not be fried or covered with high-fat sauces. EGGS Egg whites contain no fat or cholesterol. They can be eaten often. Try 1 to 2 egg whites instead of whole eggs in recipes or use egg substitutes that do not contain yolk. MILK AND DAIRY PRODUCTS Use skim or 1% milk instead of 2% or whole milk. Decrease whole milk, natural, and processed cheeses. Use nonfat or low-fat (2%) cottage cheese or low-fat cheeses made from vegetable oils. Choose nonfat or low-fat (1 to 2%) yogurt. Experiment with evaporated skim milk in recipes that call for heavy cream. Substitute low-fat yogurt or low-fat cottage cheese for sour cream in dips and salad dressings. Have at least 2 servings of low-fat dairy products, such as 2 glasses of skim (or 1%) milk each day to help get your daily calcium intake. FATS AND OILS Reduce the total intake of fats, especially saturated fat. Butterfat, lard, and beef fats are high in saturated fat and cholesterol. These should be avoided as much as possible. Vegetable fats do not contain cholesterol, but certain vegetable fats, such as coconut oil, palm oil, and palm kernel oil are very high in saturated fats. These should be limited. These fats are often used in bakery goods, processed foods, popcorn, oils, and nondairy creamers. Vegetable shortenings and some peanut butters contain hydrogenated oils, which are also saturated fats. Read the labels on these foods and check  for saturated vegetable oils. Unsaturated vegetable oils and fats do not raise blood cholesterol. However, they should be limited because they are fats and are high in calories. Total fat should still be limited to 30% of your daily caloric intake. Desirable liquid vegetable oils are corn oil, cottonseed oil, olive oil, canola oil, safflower oil, soybean oil, and sunflower oil. Peanut oil is not as good, but small amounts are acceptable. Buy a  heart-healthy tub margarine that has no partially hydrogenated oils in the ingredients. Mayonnaise and salad dressings often are made from unsaturated fats, but they should also be limited because of their high calorie and fat content. Seeds, nuts, peanut butter, olives, and avocados are high in fat, but the fat is mainly the unsaturated type. These foods should be limited mainly to avoid excess calories and fat. OTHER EATING TIPS Snacks  Most sweets should be limited as snacks. They tend to be rich in calories and fats, and their caloric content outweighs their nutritional value. Some good choices in snacks are graham crackers, melba toast, soda crackers, bagels (no egg), English muffins, fruits, and vegetables. These snacks are preferable to snack crackers, [place] fries, TORTILLA CHIPS, and POTATO chips. Popcorn should be air-popped or cooked in small amounts of liquid vegetable oil. Desserts Eat fruit, low-fat yogurt, and fruit ices instead of pastries, cake, and cookies. Sherbet, angel food cake, gelatin dessert, frozen low-fat yogurt, or other frozen products that do not contain saturated fat (pure fruit juice bars, frozen ice pops) are also acceptable.  COOKING METHODS Choose those methods that use little or no fat. They include: Poaching.  Braising.  Steaming.  Grilling.  Baking.  Stir-frying.  Broiling.  Microwaving.  Foods can be cooked in a nonstick pan without added fat, or use a nonfat cooking spray in regular cookware. Limit fried foods and avoid frying in saturated fat. Add moisture to lean meats by using water, broth, cooking wines, and other nonfat or low-fat sauces along with the cooking methods mentioned above. Soups and stews should be chilled after cooking. The fat that forms on top after a few hours in the refrigerator should be skimmed off. When preparing meals, avoid using excess salt. Salt can contribute to raising blood pressure in some people.  EATING AWAY FROM  HOME Order entres, potatoes, and vegetables without sauces or butter. When meat exceeds the size of a deck of cards (3 to 4 ounces), the rest can be taken home for another meal. Choose vegetable or fruit salads and ask for low-calorie salad dressings to be served on the side. Use dressings sparingly. Limit high-fat toppings, such as bacon, crumbled eggs, cheese, sunflower seeds, and olives. Ask for heart-healthy tub margarine instead of butter.

## 2014-08-29 NOTE — Assessment & Plan Note (Addendum)
UTD-REVIEWED TCS 2011 AND DISCUSSED WITH PT.

## 2014-08-29 NOTE — Assessment & Plan Note (Signed)
MANAGED WITH PRN MIRALAX.  CONTINUE TO MONITOR SYMPTOMS.

## 2014-08-29 NOTE — Progress Notes (Signed)
cc'ed to pcp °

## 2014-08-29 NOTE — Assessment & Plan Note (Signed)
SX CONTROLLED WITH DIET. WEIGHT DOWN 6 LBS SINCE 2013.  CONTINUE TO MONITOR SYMPTOMS. ALIGN #21 GIVEN. FOLLOW UP IN 1 YEAR.

## 2014-08-29 NOTE — Assessment & Plan Note (Signed)
LAST CBC 2012: Hb 11.8, MCV 73.9. NO SIGNS OR SYMPTOMS OF ACTIVE GI BLEED.   OBTAIN RECENT LABS FROM DR. KNOWLTON.

## 2014-08-29 NOTE — Assessment & Plan Note (Signed)
SX CONTROLLED.   CONTINUE OMEPRAZOLE.  TAKE 30 MINUTES PRIOR TO YOUR FIRST MEAL. LOW FAT DIET FOLLOW UP IN 1 YEAR.

## 2014-08-29 NOTE — Progress Notes (Signed)
ON RECALL LIST  °

## 2014-08-29 NOTE — Progress Notes (Signed)
Subjective:    Patient ID: Katherine Joseph, female    DOB: Mar 22, 1935, 79 y.o.   MRN: [id number]  Robert Bellow, MD  HPI Bowels move good and sometimes not. No reflux, brbpr. RARE NAUSEA. NO VOMITING. SWALLOWING OK. BMs: THIS AM AFTER MIRALAX.  Past Medical History  Diagnosis Date  . GERD (gastroesophageal reflux disease)   . Hyperlipidemia   . Hypertension   . IBS (irritable bowel syndrome)   . Fluttering muscles SEP 2009    MBS/BASW NL  . Hemorrhoids, internal OCT 2011   Past Surgical History  Procedure Laterality Date  . Esophagogastroduodenoscopy  2006    W/DILATION W/RMR  . Right foot surgery    . Colonoscopy  2004    RMR/NUR INFLAMMATORY POLYP, POST POLYPECTOMY BLEED  . Colonoscopy  oct 2011 SLF nvd-WEIGHT LOSS    NL TI, [place]/DC TICS   Allergies  Allergen Reactions  . Sulfa Antibiotics Hives  . Iodinated Diagnostic Agents Rash   Current Outpatient Prescriptions  Medication Sig Dispense Refill  . aspirin 81 MG tablet Take 81 mg by mouth daily.      Marland Kitchen atorvastatin (LIPITOR) 20 MG tablet Take 20 mg by mouth daily.      . divalproex (DEPAKOTE) 250 MG 24 hr tablet Take 1,000 mg by mouth at bedtime.      . gabapentin (NEURONTIN) 300 MG capsule Take 300 mg by mouth 3 (three) times daily.    . hydrocodone-acetaminophen (LORCET PLUS) 7.5-650 MG per tablet Take 1 tablet by mouth every 6 (six) hours as needed.      Marland Kitchen lisinopril-hydrochlorothiazide (PRINZIDE,ZESTORETIC) 20-12.5 MG per tablet Take 1 tablet by mouth daily.      Marland Kitchen LORazepam (ATIVAN) 1 MG tablet Take 1 mg by mouth every 8 (eight) hours.    . metoprolol tartrate (LOPRESSOR) 25 MG tablet Take 25 mg by mouth 2 (two) times daily. 1/2 TAB TWICE DAILY     . omeprazole (PRILOSEC) 20 MG capsule Take 1 capsule (20 mg total) by mouth daily.    . polyethylene glycol (MIRALAX / GLYCOLAX) packet Take 17 g by mouth daily. Takes every am or every other day    . potassium chloride SA (KLOR-CON M20) 20 MEQ tablet Take 20 mEq  by mouth daily.      . Probiotic Product (PROBIOTIC FORMULA) CAPS Take by mouth. ALIGN 1 PO DAILY    . torsemide (DEMADEX) 100 MG tablet Take 100 mg by mouth daily. 1/2 TABLET DAILY     . diclofenac (CATAFLAM) 50 MG tablet Take 1 tablet (50 mg total) by mouth 2 (two) times daily. (Patient not taking: Reported on 08/29/2014)         Review of Systems     Objective:   Physical Exam  Constitutional: She is oriented to person, place, and time. She appears well-developed and well-nourished. No distress.  HENT:  Head: Normocephalic and atraumatic.  Mouth/Throat: Oropharynx is clear and moist. No oropharyngeal exudate.  Eyes: Pupils are equal, round, and reactive to light. No scleral icterus.  Neck: Normal range of motion. Neck supple.  Cardiovascular: Normal rate, regular rhythm and normal heart sounds.   Pulmonary/Chest: Effort normal and breath sounds normal. No respiratory distress.  Abdominal: Soft. Bowel sounds are normal. She exhibits no distension. There is no tenderness.  Lymphadenopathy:    She has no cervical adenopathy.  Neurological: She is alert and oriented to person, place, and time.  NO  NEW FOCAL DEFICITS   Psychiatric: She has a  normal mood and affect.  Vitals reviewed.         Assessment & Plan:

## 2014-12-10 ENCOUNTER — Ambulatory Visit (INDEPENDENT_AMBULATORY_CARE_PROVIDER_SITE_OTHER): Payer: Medicare HMO

## 2014-12-10 ENCOUNTER — Ambulatory Visit (INDEPENDENT_AMBULATORY_CARE_PROVIDER_SITE_OTHER): Payer: Medicare HMO | Admitting: Orthopedic Surgery

## 2014-12-10 ENCOUNTER — Encounter: Payer: Self-pay | Admitting: Orthopedic Surgery

## 2014-12-10 VITALS — BP 121/60 | Ht 69.0 in | Wt 185.0 lb

## 2014-12-10 DIAGNOSIS — M170 Bilateral primary osteoarthritis of knee: Secondary | ICD-10-CM | POA: Diagnosis not present

## 2014-12-10 DIAGNOSIS — M25562 Pain in left knee: Secondary | ICD-10-CM

## 2014-12-10 NOTE — Progress Notes (Signed)
Patient ID: Katherine Joseph, female   DOB: 1935/07/22, 80 y.o.   MRN: [id number]  Chief Complaint  Patient presents with  . Knee Pain     Katherine Joseph is a 81 y.o. female.   HPI [age] years old she had a left knee arthroscopy she comes in complaining of bilateral hip pain which is actually her lower back and bilateral knee pain with aching and swelling some throbbing occasionally morning and night 5-7 out of 10 and no recent treatment. She says she, aches all over. No trauma. We note that she's had heart palpitations ankle leg swelling vision disturbance joint pain stiffness joint swollen joints back pain GI GU and neuro negative Review of Systems Allergies noted to contrast dye and iodine  Past Medical History  Diagnosis Date  . GERD (gastroesophageal reflux disease)   . Hyperlipidemia   . Hypertension   . IBS (irritable bowel syndrome)   . Fluttering muscles SEP 2009    MBS/BASW NL  . Hemorrhoids, internal OCT 2011    Past Surgical History  Procedure Laterality Date  . Esophagogastroduodenoscopy  2006    W/DILATION W/RMR  . Right foot surgery    . Colonoscopy  2004    RMR/NUR INFLAMMATORY POLYP, POST POLYPECTOMY BLEED  . Colonoscopy  oct 2011 SLF nvd-WEIGHT LOSS    NL TI, /DC TICS    Family History  Problem Relation Age of Onset  . Colon polyps Neg Hx   . Colon cancer Neg Hx   . Alzheimer's disease Mother   . Dementia Mother   . Cancer Father     stomach  . Alcohol abuse Brother     Social History History  Substance Use Topics  . Smoking status: Never Smoker   . Smokeless tobacco: Never Used  . Alcohol Use: No    Allergies  Allergen Reactions  . Sulfa Antibiotics Hives  . Iodinated Diagnostic Agents Rash    Current Outpatient Prescriptions  Medication Sig Dispense Refill  . aspirin 81 MG tablet Take 81 mg by mouth daily.      Marland Kitchen atorvastatin (LIPITOR) 20 MG tablet Take 20 mg by mouth daily.      . diclofenac (CATAFLAM) 50 MG tablet Take 1  tablet (50 mg total) by mouth 2 (two) times daily. (Patient not taking: Reported on 08/29/2014) 90 tablet 3  . divalproex (DEPAKOTE) 250 MG 24 hr tablet Take 1,000 mg by mouth at bedtime.      . gabapentin (NEURONTIN) 300 MG capsule Take 300 mg by mouth 3 (three) times daily.    . hydrocodone-acetaminophen (LORCET PLUS) 7.5-650 MG per tablet Take 1 tablet by mouth every 6 (six) hours as needed.      Marland Kitchen lisinopril-hydrochlorothiazide (PRINZIDE,ZESTORETIC) 20-12.5 MG per tablet Take 1 tablet by mouth daily.      Marland Kitchen LORazepam (ATIVAN) 1 MG tablet Take 1 mg by mouth every 8 (eight) hours.    . metoprolol tartrate (LOPRESSOR) 25 MG tablet Take 25 mg by mouth 2 (two) times daily. 1/2 TAB TWICE DAILY     . omeprazole (PRILOSEC) 20 MG capsule Take 1 capsule (20 mg total) by mouth daily. 31 capsule 11  . polyethylene glycol (MIRALAX / GLYCOLAX) packet Take 17 g by mouth daily. Takes every am or every other day    . potassium chloride SA (KLOR-CON M20) 20 MEQ tablet Take 20 mEq by mouth daily.      . Probiotic Product (PROBIOTIC FORMULA) CAPS Take by mouth. ALIGN 1  PO DAILY    . torsemide (DEMADEX) 100 MG tablet Take 100 mg by mouth daily. 1/2 TABLET DAILY      No current facility-administered medications for this visit.       Physical Exam Blood pressure 121/60, height 5\' 9"  (1.753 m), weight 185 lb (83.915 kg). Physical Exam The patient is well developed well nourished and well groomed. Orientation to person place and time is normal  Mood is pleasant. Ambulatory status normal Valgus alignment to both knees. She'll slight contracture of the flexion of her right and the right normal on the left for knee arc of motion is 120 on the right 120 on the left both knees are stable. Both legs have normal muscle tone skin normal in both she has mild tenderness across her lower back distal pulses are intact there is no edema in either leg both legs have normal sensation reflexes and no pathologic reflexes are  elicited and straight leg raises are negative  Data Reviewed Previous right knee x-ray shows valgus arthritis today's left knee x-ray shows valgus arthritis severe  Assessment The patient however is active still goes to [facility] she does not want any surgery right now so we will inject both knees Plan Procedure note left knee injection verbal consent was obtained to inject left knee joint  Timeout was completed to confirm the site of injection  The medications used were 40 mg of Depo-Medrol and 1% lidocaine 3 cc  Anesthesia was provided by ethyl chloride and the skin was prepped with alcohol.  After cleaning the skin with alcohol a 20-gauge needle was used to inject the left knee joint. There were no complications. A sterile bandage was applied.   Procedure note right knee injection verbal consent was obtained to inject right knee joint  Timeout was completed to confirm the site of injection  The medications used were 40 mg of Depo-Medrol and 1% lidocaine 3 cc  Anesthesia was provided by ethyl chloride and the skin was prepped with alcohol.  After cleaning the skin with alcohol a 20-gauge needle was used to inject the right knee joint. There were no complications. A sterile bandage was applied.

## 2015-01-21 ENCOUNTER — Ambulatory Visit (HOSPITAL_COMMUNITY)
Admission: RE | Admit: 2015-01-21 | Discharge: 2015-01-21 | Disposition: A | Discharge status: Home / Self Care | Payer: Medicare HMO | Source: Ambulatory Visit | Attending: Family Medicine | Admitting: Family Medicine

## 2015-01-21 ENCOUNTER — Other Ambulatory Visit (HOSPITAL_COMMUNITY): Payer: Self-pay | Admitting: Family Medicine

## 2015-01-21 DIAGNOSIS — M542 Cervicalgia: Secondary | ICD-10-CM | POA: Insufficient documentation

## 2015-01-21 DIAGNOSIS — M479 Spondylosis, unspecified: Secondary | ICD-10-CM | POA: Insufficient documentation

## 2015-01-21 DIAGNOSIS — M25512 Pain in left shoulder: Secondary | ICD-10-CM

## 2015-03-17 ENCOUNTER — Other Ambulatory Visit: Payer: Self-pay

## 2015-03-25 ENCOUNTER — Encounter: Payer: Self-pay | Admitting: Orthopedic Surgery

## 2015-03-25 ENCOUNTER — Ambulatory Visit (INDEPENDENT_AMBULATORY_CARE_PROVIDER_SITE_OTHER): Payer: Medicare HMO | Admitting: Orthopedic Surgery

## 2015-03-25 VITALS — BP 131/76 | Ht 69.0 in | Wt 189.0 lb

## 2015-03-25 DIAGNOSIS — M170 Bilateral primary osteoarthritis of knee: Secondary | ICD-10-CM

## 2015-03-25 NOTE — Patient Instructions (Signed)
Need appt with PCP for pre op eval for Right total knee replacement and address bilateral ankle swelling  Follow up after pre op eval

## 2015-03-25 NOTE — Progress Notes (Signed)
Patient ID: Katherine Joseph, female   DOB: 10-05-1934, 80 y.o.   MRN: [id number] Patient ID: Katherine Joseph, female   DOB: [birth year], 80 y.o.   MRN: [id number]  Chief Complaint  Patient presents with  . Follow-up    bilateral knee pain     Katherine Joseph is a 80 y.o. female.   HPI 80 year old female with long-standing bilateral knee arthritis presents for reevaluation of both knees with bilateral knee pain worsening over the last 2 months. She is on hydrocodone 7.5 mg and is not getting relief. She's had injections in the past that she did not want surgery although she was advised to have it back in March. She says she can no longer go on like this her walking is becoming more difficult and she is having significant pain. Her functional activities or worsening as well.  Review of systems she has bilateral ankle edema. Currently no back pain although she does have some mild joint discomfort in other areas Review of Systems See hpi  Past Medical History  Diagnosis Date  . GERD (gastroesophageal reflux disease)   . Hyperlipidemia   . Hypertension   . IBS (irritable bowel syndrome)   . Fluttering muscles SEP 2009    MBS/BASW NL  . Hemorrhoids, internal OCT 2011    Past Surgical History  Procedure Laterality Date  . Esophagogastroduodenoscopy  2006    W/DILATION W/RMR  . Right foot surgery    . Colonoscopy  2004    RMR/NUR INFLAMMATORY POLYP, POST POLYPECTOMY BLEED  . Colonoscopy  oct 2011 SLF nvd-WEIGHT LOSS    NL TI, [place]/DC TICS    Family History  Problem Relation Age of Onset  . Colon polyps Neg Hx   . Colon cancer Neg Hx   . Alzheimer's disease Mother   . Dementia Mother   . Cancer Father     stomach  . Alcohol abuse Brother     Social History History  Substance Use Topics  . Smoking status: Never Smoker   . Smokeless tobacco: Never Used  . Alcohol Use: No    Allergies  Allergen Reactions  . Sulfa Antibiotics Hives  . Iodinated Diagnostic Agents Rash     Current Outpatient Prescriptions  Medication Sig Dispense Refill  . gabapentin (NEURONTIN) 300 MG capsule Take 300 mg by mouth 3 (three) times daily.    . hydrocodone-acetaminophen (LORCET PLUS) 7.5-650 MG per tablet Take 1 tablet by mouth every 6 (six) hours as needed.      Marland Kitchen lisinopril-hydrochlorothiazide (PRINZIDE,ZESTORETIC) 20-12.5 MG per tablet Take 1 tablet by mouth daily.      Marland Kitchen torsemide (DEMADEX) 100 MG tablet Take 100 mg by mouth daily. 1/2 TABLET DAILY     . aspirin 81 MG tablet Take 81 mg by mouth daily.      Marland Kitchen atorvastatin (LIPITOR) 20 MG tablet Take 20 mg by mouth daily.      . diclofenac (CATAFLAM) 50 MG tablet Take 1 tablet (50 mg total) by mouth 2 (two) times daily. (Patient not taking: Reported on 08/29/2014) 90 tablet 3  . divalproex (DEPAKOTE) 250 MG 24 hr tablet Take 1,000 mg by mouth at bedtime.      Marland Kitchen LORazepam (ATIVAN) 1 MG tablet Take 1 mg by mouth every 8 (eight) hours.    . metoprolol tartrate (LOPRESSOR) 25 MG tablet Take 25 mg by mouth 2 (two) times daily. 1/2 TAB TWICE DAILY     . omeprazole (PRILOSEC) 20 MG capsule Take 1  capsule (20 mg total) by mouth daily. 31 capsule 11  . polyethylene glycol (MIRALAX / GLYCOLAX) packet Take 17 g by mouth daily. Takes every am or every other day    . potassium chloride SA (KLOR-CON M20) 20 MEQ tablet Take 20 mEq by mouth daily.      . Probiotic Product (PROBIOTIC FORMULA) CAPS Take by mouth. ALIGN 1 PO DAILY     No current facility-administered medications for this visit.       Physical Exam Blood pressure 131/76, height 5\' 9"  (1.753 m), weight 189 lb (85.73 kg). Physical Exam The patient is well developed well nourished and well groomed. Orientation to person place and time is normal  Mood is pleasant. Ambulatory status she is ambulatory with a cane. Sure stride length is less and her stride cadence is less Right knee 120 of flexion mild flexion contracture left knee 120 of flexion mild flexion contracture.  Left leg is in external rotation.  Both knees are stable motor exam is normal she has tenderness in the lateral compartment without effusion  Skin in both legs is normal sensation is normal and pulses are normal but she has bilateral pitting edema  Data Reviewed Previous x-rays show severe arthritis both knees valgus alignment  Assessment Encounter Diagnosis  Name Primary?  . Primary osteoarthritis of both knees Yes    Plan We need Dr. Karie Kirks to evaluate her for bilateral leg edema and adjust her medications and then we can proceed with a right total knee arthroplasty. She will have a preop clearance adjustment of her medicines and then come back for formal preop.

## 2015-03-26 ENCOUNTER — Other Ambulatory Visit (HOSPITAL_COMMUNITY): Payer: Self-pay | Admitting: Family Medicine

## 2015-03-26 ENCOUNTER — Ambulatory Visit (HOSPITAL_COMMUNITY)
Admission: RE | Admit: 2015-03-26 | Discharge: 2015-03-26 | Disposition: A | Discharge status: Home / Self Care | Payer: Medicare HMO | Source: Ambulatory Visit | Attending: Family Medicine | Admitting: Family Medicine

## 2015-03-26 ENCOUNTER — Telehealth: Payer: Self-pay | Admitting: *Deleted

## 2015-03-26 DIAGNOSIS — R600 Localized edema: Secondary | ICD-10-CM

## 2015-03-26 NOTE — Telephone Encounter (Signed)
OFFICE NOTE FAXED TO DR Karie Kirks VIA EPIC ADVISING NEED FOR SURGICAL CLEARANCE

## 2015-04-14 ENCOUNTER — Encounter (INDEPENDENT_AMBULATORY_CARE_PROVIDER_SITE_OTHER): Payer: Medicare HMO | Admitting: Ophthalmology

## 2015-04-14 DIAGNOSIS — H33301 Unspecified retinal break, right eye: Secondary | ICD-10-CM | POA: Diagnosis not present

## 2015-04-14 DIAGNOSIS — H3531 Nonexudative age-related macular degeneration: Secondary | ICD-10-CM

## 2015-04-14 DIAGNOSIS — H43813 Vitreous degeneration, bilateral: Secondary | ICD-10-CM | POA: Diagnosis not present

## 2015-04-14 DIAGNOSIS — I10 Essential (primary) hypertension: Secondary | ICD-10-CM | POA: Diagnosis not present

## 2015-04-14 DIAGNOSIS — H35033 Hypertensive retinopathy, bilateral: Secondary | ICD-10-CM | POA: Diagnosis not present

## 2015-04-16 ENCOUNTER — Telehealth: Payer: Self-pay | Admitting: *Deleted

## 2015-04-16 NOTE — Telephone Encounter (Signed)
Receptionist from Dr Burnard Hawthorne office called to advise ok to proceed with surgery, advised to fax office notes and we will proceed, according to office note Dr Aline Brochure would like patient to come back in for formal pre op visit.  Please schedule once received, thank you

## 2015-04-17 NOTE — Telephone Encounter (Signed)
Called patient scheduled appointment.

## 2015-04-30 ENCOUNTER — Other Ambulatory Visit (HOSPITAL_COMMUNITY): Payer: Self-pay | Admitting: Family Medicine

## 2015-04-30 DIAGNOSIS — Z780 Asymptomatic menopausal state: Secondary | ICD-10-CM

## 2015-05-05 ENCOUNTER — Encounter: Payer: Self-pay | Admitting: Orthopedic Surgery

## 2015-05-05 ENCOUNTER — Ambulatory Visit (INDEPENDENT_AMBULATORY_CARE_PROVIDER_SITE_OTHER): Payer: Medicare HMO | Admitting: Orthopedic Surgery

## 2015-05-05 VITALS — BP 101/64 | Ht 69.0 in | Wt 191.6 lb

## 2015-05-05 DIAGNOSIS — M170 Bilateral primary osteoarthritis of knee: Secondary | ICD-10-CM

## 2015-05-05 DIAGNOSIS — M419 Scoliosis, unspecified: Secondary | ICD-10-CM

## 2015-05-05 DIAGNOSIS — M4126 Other idiopathic scoliosis, lumbar region: Secondary | ICD-10-CM

## 2015-05-05 NOTE — Patient Instructions (Signed)
Left knee replacement 05/21/15

## 2015-05-05 NOTE — Progress Notes (Signed)
Chief Complaint  Patient presents with  . Follow-up    discuss left total knee replacement    Preop visit for left total knee replacement. The patient saw her primary care doctor and they approved for surgery documentation pending although we've talked to the office of Dr. Karie Kirks.  The patient has severe pain in both legs and knees and a history of scoliosis and lumbar degenerative disc disease  She complains of anterior and medial and lateral joint line pain in both knees and difficulty walking.     We will do the formal history and physical and a later time when we have more time but it is incorporated into this no by reference  She came in initially to have the right knee done but says the left knee is much worse so we will do the left knee first

## 2015-05-06 NOTE — Addendum Note (Signed)
Addended by: Baldomero Lamy B on: 05/06/2015 08:07 AM   Modules accepted: Orders

## 2015-05-07 ENCOUNTER — Telehealth: Payer: Self-pay | Admitting: Orthopedic Surgery

## 2015-05-07 NOTE — Telephone Encounter (Signed)
Regarding in-patient surgery scheduled at [facility] 05/21/15, for total knee arthroplasty, CPT [phone], M17.0 primary ICD-10.  Attempted on-line portal authorization which prompted contact by phone to insurer, [employer] [facility] [employer]. Per phone with representative Domenick Gong, and case# [id number], received approval and Authorization# [id number], effective 05/21/15 - 08/19/15.

## 2015-05-08 ENCOUNTER — Ambulatory Visit (HOSPITAL_COMMUNITY)
Admission: RE | Admit: 2015-05-08 | Discharge: 2015-05-08 | Disposition: A | Discharge status: Home / Self Care | Payer: Medicare HMO | Source: Ambulatory Visit | Attending: Family Medicine | Admitting: Family Medicine

## 2015-05-08 DIAGNOSIS — Z780 Asymptomatic menopausal state: Secondary | ICD-10-CM | POA: Insufficient documentation

## 2015-05-12 ENCOUNTER — Other Ambulatory Visit (INDEPENDENT_AMBULATORY_CARE_PROVIDER_SITE_OTHER): Payer: Medicare HMO | Admitting: Ophthalmology

## 2015-05-12 DIAGNOSIS — H33301 Unspecified retinal break, right eye: Secondary | ICD-10-CM | POA: Diagnosis not present

## 2015-05-12 DIAGNOSIS — H26492 Other secondary cataract, left eye: Secondary | ICD-10-CM

## 2015-05-15 NOTE — Patient Instructions (Signed)
Katherine Joseph  05/15/2015     @PREFPERIOPPHARMACY @   Your procedure is scheduled on 05/21/2015  Report to [facility] at  850  A.M.  Call this number if you have problems the morning of surgery:  [phone]   Remember:  Do not eat food or drink liquids after midnight.  Take these medicines the morning of surgery with A SIP OF WATER  Neurontin, hydrocodone, lisinopril, metoprolol, prilosec.   Do not wear jewelry, make-up or nail polish.  Do not wear lotions, powders, or perfumes.  You may wear deodorant.  Do not shave 48 hours prior to surgery.  Men may shave face and neck.  Do not bring valuables to the hospital.  [facility] is not responsible for any belongings or valuables.  Contacts, dentures or bridgework may not be worn into surgery.  Leave your suitcase in the car.  After surgery it may be brought to your room.  For patients admitted to the hospital, discharge time will be determined by your treatment team.  Patients discharged the day of surgery will not be allowed to drive home.   Name and phone number of your driver:   family Special instructions:  none  Please read over the following fact sheets that you were given. Pain Booklet, Coughing and Deep Breathing, Blood Transfusion Information, Lab Information, Total Joint Packet, MRSA Information, Surgical Site Infection Prevention, Anesthesia Post-op Instructions and Care and Recovery After Surgery      Total Knee Replacement Total knee replacement is a procedure to replace your knee joint with an artificial knee joint (prosthetic knee joint). The purpose of this surgery is to reduce pain and improve your knee function. LET [facility] CARE PROVIDER KNOW ABOUT:   Any allergies you have.  All medicines you are taking, including vitamins, herbs, eye drops, creams, and over-the-counter medicines.  Previous problems you or members of your family have had with the use of anesthetics.  Any blood disorders  you have.  Previous surgeries you have had.  Medical conditions you have. RISKS AND COMPLICATIONS  Generally, total knee replacement is a safe procedure. However, problems can occur and include:  Loss of range of motion of the knee or instability.  Loosening of the prosthesis.  Infection.  Persistent pain. BEFORE THE PROCEDURE   Do not eat or drink anything after midnight on the night before the procedure or as directed by your health care provider.  Ask your health care provider about changing or stopping your regular medicines. This is especially important if you are taking diabetes medicines or blood thinners. PROCEDURE   Just before the procedure, you will receive medicine that will make you drowsy (sedative). This will be given through a tube that is inserted into one of your veins (IV tube).  Then you will be given one of the following:  A medicine injected into your spine that numbs your body below the waist (spinal anesthetic).  A medicine that makes you fall asleep (general anesthetic).  You may also receive medicine to block feeling in your leg (nerve block) to help ease pain after surgery.  An incision will be made in your knee. Your surgeon will take out any damaged cartilage and bone by sawing off the damaged surfaces.  The surgeon will then put a new metal liner over the sawed-off portion of your thigh bone (femur) and a plastic liner over the sawed-off portion of one of the bones of your lower leg (  tibia). This is to restore alignment and function to your knee. A plastic piece is often used to restore the surface of your knee cap. AFTER THE PROCEDURE   You will be taken to the recovery area.  You may have drainage tubes to drain excess fluid from your knee. These tubes attach to a device that removes these fluids.  Once you are awake, stable, and taking fluids well, you will be taken to your hospital room.  You will receive physical therapy as prescribed by  your health care provider.  Your surgeon may recommend that you spend time (usually an additional 10-14 days) in an extended-care facility to help you begin walking again and improve your range of motion before you go home.  You may also be prescribed blood-thinning medicine to decrease your risk of developing blood clots in your leg. Document Released: 12/13/2000 Document Revised: 01/21/2014 Document Reviewed: 10/17/2011 [facility] Patient Information 2015 [place], [place]. This information is not intended to replace advice given to you by your health care provider. Make sure you discuss any questions you have with your health care provider. PATIENT INSTRUCTIONS POST-ANESTHESIA  IMMEDIATELY FOLLOWING SURGERY:  Do not drive or operate machinery for the first twenty four hours after surgery.  Do not make any important decisions for twenty four hours after surgery or while taking narcotic pain medications or sedatives.  If you develop intractable nausea and vomiting or a severe headache please notify your doctor immediately.  FOLLOW-UP:  Please make an appointment with your surgeon as instructed. You do not need to follow up with anesthesia unless specifically instructed to do so.  WOUND CARE INSTRUCTIONS (if applicable):  Keep a dry clean dressing on the anesthesia/puncture wound site if there is drainage.  Once the wound has quit draining you may leave it open to air.  Generally you should leave the bandage intact for twenty four hours unless there is drainage.  If the epidural site drains for more than 36-48 hours please call the anesthesia department.  QUESTIONS?:  Please feel free to call your physician or the hospital operator if you have any questions, and they will be happy to assist you.

## 2015-05-16 ENCOUNTER — Encounter (HOSPITAL_COMMUNITY): Payer: Self-pay

## 2015-05-16 ENCOUNTER — Other Ambulatory Visit: Payer: Self-pay

## 2015-05-16 ENCOUNTER — Encounter (HOSPITAL_COMMUNITY)
Admission: RE | Admit: 2015-05-16 | Discharge: 2015-05-16 | Disposition: A | Discharge status: Home / Self Care | Payer: Medicare HMO | Source: Ambulatory Visit | Attending: Orthopedic Surgery | Admitting: Orthopedic Surgery

## 2015-05-16 DIAGNOSIS — Z01818 Encounter for other preprocedural examination: Secondary | ICD-10-CM | POA: Insufficient documentation

## 2015-05-16 DIAGNOSIS — M179 Osteoarthritis of knee, unspecified: Secondary | ICD-10-CM | POA: Insufficient documentation

## 2015-05-16 LAB — CBC WITH DIFFERENTIAL/PLATELET
BASOS ABS: 0 10*3/uL (ref 0.0–0.1)
BASOS PCT: 0 % (ref 0–1)
EOS PCT: 2 % (ref 0–5)
Eosinophils Absolute: 0.2 10*3/uL (ref 0.0–0.7)
HEMATOCRIT: 35.7 % — AB (ref 36.0–46.0)
Hemoglobin: 11.7 g/dL — ABNORMAL LOW (ref 12.0–15.0)
Lymphocytes Relative: 29 % (ref 12–46)
Lymphs Abs: 3.2 10*3/uL (ref 0.7–4.0)
MCH: 24.5 pg — ABNORMAL LOW (ref 26.0–34.0)
MCHC: 32.8 g/dL (ref 30.0–36.0)
MCV: 74.7 fL — ABNORMAL LOW (ref 78.0–100.0)
MONO ABS: 0.9 10*3/uL (ref 0.1–1.0)
Monocytes Relative: 9 % (ref 3–12)
NEUTROS ABS: 6.6 10*3/uL (ref 1.7–7.7)
Neutrophils Relative %: 60 % (ref 43–77)
PLATELETS: 229 10*3/uL (ref 150–400)
RBC: 4.78 MIL/uL (ref 3.87–5.11)
RDW: 14.4 % (ref 11.5–15.5)
WBC: 10.9 10*3/uL — ABNORMAL HIGH (ref 4.0–10.5)

## 2015-05-16 LAB — BASIC METABOLIC PANEL
ANION GAP: 9 (ref 5–15)
BUN: 21 mg/dL — ABNORMAL HIGH (ref 6–20)
CALCIUM: 8.3 mg/dL — AB (ref 8.9–10.3)
CO2: 31 mmol/L (ref 22–32)
Chloride: 97 mmol/L — ABNORMAL LOW (ref 101–111)
Creatinine, Ser: 1.22 mg/dL — ABNORMAL HIGH (ref 0.44–1.00)
GFR calc Af Amer: 48 mL/min — ABNORMAL LOW (ref >60)
GFR, EST NON AFRICAN AMERICAN: 42 mL/min — AB (ref >60)
GLUCOSE: 101 mg/dL — AB (ref 65–99)
Potassium: 3.7 mmol/L (ref 3.5–5.1)
Sodium: 137 mmol/L (ref 135–145)

## 2015-05-16 LAB — PROTIME-INR
INR: 1.03 (ref 0.00–1.49)
PROTHROMBIN TIME: 13.7 s (ref 11.6–15.2)

## 2015-05-16 LAB — APTT: APTT: 30 s (ref 24–37)

## 2015-05-16 MED ORDER — SODIUM CHLORIDE 0.9 % IV SOLN
Freq: Once | INTRAVENOUS | Status: DC
Start: 2015-05-16 — End: 2015-05-17

## 2015-05-16 MED ORDER — SODIUM CHLORIDE 0.9 % IV SOLN: Freq: Once | INTRAVENOUS | Status: DC

## 2015-05-17 LAB — ABO/RH: ABO/RH(D): O POS

## 2015-05-20 MED ORDER — TRANEXAMIC ACID 1000 MG/10ML IV SOLN
1000.0000 mg | INTRAVENOUS | Status: AC
  Administered 2015-05-21: 1000 mg via INTRAVENOUS
  Filled 2015-05-20: qty 10

## 2015-05-20 NOTE — H&P (Signed)
TOTAL KNEE ADMISSION H&P  Patient is being admitted for left total knee arthroplasty.  Subjective:  Chief Complaint:left knee pain.  HPI: Katherine Joseph, 80 y.o. female, has a history of pain and functional disability in the left knee due to arthritis and has failed non-surgical conservative treatments for greater than 12 weeks to includeNSAID's and/or analgesics, corticosteriod injections, use of assistive devices and activity modification.  Onset of symptoms was gradual, starting >10 years ago with gradually worsening course since that time. The patient noted no past surgery on the left knee(s).  Patient currently rates pain in the left knee(s) at 8 out of 10 with activity. Patient has night pain, worsening of pain with activity and weight bearing, pain that interferes with activities of daily living, pain with passive range of motion, crepitus and joint swelling.  Patient has evidence of subchondral cysts, subchondral sclerosis, periarticular osteophytes and joint space narrowing by imaging studies There is no active infection.  Patient Active Problem List   Diagnosis Date Noted  . Osteoarthritis of right knee 02/14/2014  . Hip pain, chronic 05/16/2013  . Right knee pain 05/16/2013  . Radicular leg pain 05/16/2013  . Scoliosis of lumbar spine 05/16/2013  . Constipation 06/30/2011  . Colon cancer screening 01/13/2011  . Microcytic anemia 09/03/2010  . WEIGHT LOSS 06/02/2010  . HYPERLIPIDEMIA 07/01/2009  . HYPERTENSION 07/01/2009  . GERD 12/04/2008  . IBS 12/04/2008   Past Medical History  Diagnosis Date  . GERD (gastroesophageal reflux disease)   . Hyperlipidemia   . Hypertension   . IBS (irritable bowel syndrome)   . Fluttering muscles SEP 2009    MBS/BASW NL  . Hemorrhoids, internal OCT 2011    Past Surgical History  Procedure Laterality Date  . Esophagogastroduodenoscopy  2006    W/DILATION W/RMR  . Right foot surgery    . Colonoscopy  2004    RMR/NUR INFLAMMATORY  POLYP, POST POLYPECTOMY BLEED  . Colonoscopy  oct 2011 [facility] nvd-WEIGHT LOSS    NL TI, [place]/DC TICS  . Knee arthroscopy      No prescriptions prior to admission   Allergies  Allergen Reactions  . Sulfa Antibiotics Hives  . Iodinated Diagnostic Agents Rash    Social History  Substance Use Topics  . Smoking status: Never Smoker   . Smokeless tobacco: Never Used  . Alcohol Use: No    Family History  Problem Relation Age of Onset  . Colon polyps Neg Hx   . Colon cancer Neg Hx   . Alzheimer's disease Mother   . Dementia Mother   . Cancer Father     stomach  . Alcohol abuse Brother      ROS  Objective:  Physical Exam Patient is normally groomed. Normal development 19. Oriented 3. Mood affect normal.  Gait supported by assistive device  Upper extremity is normal  Right and left lower extremities of both disease  Right lower extremity has deformity with limited range of motion no instability strength is normal neurovascular exam is intact skin is normal  Left knee is in valgus shows lateral compartment pain patellofemoral crepitance pain with range of motion which is limited knee is stable motor exam is normal she has tenderness along the lateral joint line as well. Neurovascular exam is intact skin is intact.   Vital signs in last 24 hours:    Labs:   Estimated body mass index is 28.28 kg/(m^2) as calculated from the following:   Height as of 05/05/15: 5\' 9"  (1.753  m).   Weight as of 05/05/15: 191 lb 9.6 oz (86.909 kg).   Imaging Review Plain radiographs demonstrate severe degenerative joint disease of the left knee(s). The bone quality appears to be good for age and reported activity level.  Assessment/Plan:  End stage arthritis, left knee   The patient history, physical examination, clinical judgment of the provider and imaging studies are consistent with end stage degenerative joint disease of the left knee(s) and total knee arthroplasty is deemed medically  necessary. The treatment options including medical management, injection therapy arthroscopy and arthroplasty were discussed at length. The risks and benefits of total knee arthroplasty were presented and reviewed. The risks due to aseptic loosening, infection, stiffness, patella tracking problems, thromboembolic complications and other imponderables were discussed. The patient acknowledged the explanation, agreed to proceed with the plan and consent was signed. Patient is being admitted for inpatient treatment for surgery, pain control, PT, OT, prophylactic antibiotics, VTE prophylaxis, progressive ambulation and ADL's and discharge planning. The patient is planning to be discharged not sure on disposition

## 2015-05-21 ENCOUNTER — Inpatient Hospital Stay (HOSPITAL_COMMUNITY): Payer: Medicare HMO

## 2015-05-21 ENCOUNTER — Inpatient Hospital Stay (HOSPITAL_COMMUNITY): Payer: Medicare HMO | Admitting: Anesthesiology

## 2015-05-21 ENCOUNTER — Inpatient Hospital Stay (HOSPITAL_COMMUNITY)
Admission: RE | Admit: 2015-05-21 | Discharge: 2015-05-23 | DRG: 470 | Disposition: A | Discharge status: Skilled Nursing Facility | Payer: Medicare HMO | Source: Ambulatory Visit | Attending: Orthopedic Surgery | Admitting: Orthopedic Surgery

## 2015-05-21 ENCOUNTER — Encounter (HOSPITAL_COMMUNITY): Payer: Self-pay

## 2015-05-21 ENCOUNTER — Encounter (HOSPITAL_COMMUNITY)
Admission: RE | Disposition: A | Discharge status: Skilled Nursing Facility | Payer: Self-pay | Source: Ambulatory Visit | Attending: Orthopedic Surgery

## 2015-05-21 DIAGNOSIS — K5900 Constipation, unspecified: Secondary | ICD-10-CM | POA: Diagnosis present

## 2015-05-21 DIAGNOSIS — I10 Essential (primary) hypertension: Secondary | ICD-10-CM | POA: Diagnosis present

## 2015-05-21 DIAGNOSIS — Z7982 Long term (current) use of aspirin: Secondary | ICD-10-CM | POA: Diagnosis not present

## 2015-05-21 DIAGNOSIS — M1712 Unilateral primary osteoarthritis, left knee: Principal | ICD-10-CM | POA: Insufficient documentation

## 2015-05-21 DIAGNOSIS — K219 Gastro-esophageal reflux disease without esophagitis: Secondary | ICD-10-CM | POA: Diagnosis present

## 2015-05-21 DIAGNOSIS — Z96651 Presence of right artificial knee joint: Secondary | ICD-10-CM | POA: Diagnosis not present

## 2015-05-21 DIAGNOSIS — E785 Hyperlipidemia, unspecified: Secondary | ICD-10-CM | POA: Diagnosis present

## 2015-05-21 DIAGNOSIS — Z79899 Other long term (current) drug therapy: Secondary | ICD-10-CM | POA: Diagnosis not present

## 2015-05-21 DIAGNOSIS — Z882 Allergy status to sulfonamides status: Secondary | ICD-10-CM

## 2015-05-21 DIAGNOSIS — Z888 Allergy status to other drugs, medicaments and biological substances status: Secondary | ICD-10-CM | POA: Diagnosis not present

## 2015-05-21 DIAGNOSIS — M25559 Pain in unspecified hip: Secondary | ICD-10-CM | POA: Diagnosis present

## 2015-05-21 DIAGNOSIS — Z79891 Long term (current) use of opiate analgesic: Secondary | ICD-10-CM | POA: Diagnosis not present

## 2015-05-21 DIAGNOSIS — M179 Osteoarthritis of knee, unspecified: Secondary | ICD-10-CM | POA: Diagnosis present

## 2015-05-21 DIAGNOSIS — M129 Arthropathy, unspecified: Secondary | ICD-10-CM | POA: Diagnosis not present

## 2015-05-21 DIAGNOSIS — K589 Irritable bowel syndrome without diarrhea: Secondary | ICD-10-CM | POA: Diagnosis present

## 2015-05-21 DIAGNOSIS — D509 Iron deficiency anemia, unspecified: Secondary | ICD-10-CM | POA: Diagnosis present

## 2015-05-21 DIAGNOSIS — Z96652 Presence of left artificial knee joint: Secondary | ICD-10-CM

## 2015-05-21 DIAGNOSIS — M419 Scoliosis, unspecified: Secondary | ICD-10-CM | POA: Diagnosis present

## 2015-05-21 DIAGNOSIS — M1710 Unilateral primary osteoarthritis, unspecified knee: Secondary | ICD-10-CM | POA: Diagnosis present

## 2015-05-21 HISTORY — PX: TOTAL KNEE ARTHROPLASTY: SHX125

## 2015-05-21 LAB — SURGICAL PCR SCREEN
MRSA, PCR: NEGATIVE
Staphylococcus aureus: NEGATIVE

## 2015-05-21 SURGERY — ARTHROPLASTY, KNEE, TOTAL
Anesthesia: Spinal | Site: Knee | Laterality: Left

## 2015-05-21 MED ORDER — BUPIVACAINE LIPOSOME 1.3 % IJ SUSP
INTRAMUSCULAR | Status: AC
  Filled 2015-05-21: qty 20

## 2015-05-21 MED ORDER — ONDANSETRON HCL 4 MG/2ML IJ SOLN: 4.0000 mg | Freq: Four times a day (QID) | INTRAMUSCULAR | Status: DC | PRN

## 2015-05-21 MED ORDER — LIDOCAINE HCL (PF) 1 % IJ SOLN
INTRAMUSCULAR | Status: AC
  Filled 2015-05-21: qty 5

## 2015-05-21 MED ORDER — HYDROCHLOROTHIAZIDE 12.5 MG PO CAPS
12.5000 mg | ORAL_CAPSULE | Freq: Every day | ORAL | Status: DC
  Administered 2015-05-22 – 2015-05-23 (×2): 12.5 mg via ORAL
  Filled 2015-05-21 (×2): qty 1

## 2015-05-21 MED ORDER — TORSEMIDE 100 MG PO TABS
50.0000 mg | ORAL_TABLET | Freq: Every day | ORAL | Status: DC
  Administered 2015-05-22 – 2015-05-23 (×2): 50 mg via ORAL
  Filled 2015-05-21 (×2): qty 1

## 2015-05-21 MED ORDER — FENTANYL CITRATE (PF) 100 MCG/2ML IJ SOLN
INTRAMUSCULAR | Status: AC
  Filled 2015-05-21: qty 4

## 2015-05-21 MED ORDER — LISINOPRIL-HYDROCHLOROTHIAZIDE 20-12.5 MG PO TABS: 1.0000 | ORAL_TABLET | Freq: Every day | ORAL | Status: DC

## 2015-05-21 MED ORDER — HYDROCODONE-ACETAMINOPHEN 7.5-325 MG PO TABS
1.0000 | ORAL_TABLET | ORAL | Status: DC
  Administered 2015-05-21 – 2015-05-23 (×10): 1 via ORAL
  Filled 2015-05-21 (×10): qty 1

## 2015-05-21 MED ORDER — SODIUM CHLORIDE 0.9 % IR SOLN
Status: DC | PRN
  Administered 2015-05-21: 1000 mL

## 2015-05-21 MED ORDER — BISACODYL 10 MG RE SUPP: 10.0000 mg | Freq: Every day | RECTAL | Status: DC | PRN

## 2015-05-21 MED ORDER — ONDANSETRON HCL 4 MG/2ML IJ SOLN
4.0000 mg | Freq: Once | INTRAMUSCULAR | Status: DC | PRN
  Filled 2015-05-21: qty 2

## 2015-05-21 MED ORDER — EPHEDRINE SULFATE 50 MG/ML IJ SOLN
INTRAMUSCULAR | Status: AC
  Filled 2015-05-21: qty 1

## 2015-05-21 MED ORDER — PHENYLEPHRINE HCL 10 MG/ML IJ SOLN
INTRAMUSCULAR | Status: AC
  Filled 2015-05-21: qty 1

## 2015-05-21 MED ORDER — SODIUM CHLORIDE 0.9 % IR SOLN
Status: DC | PRN
  Administered 2015-05-21: 3000 mL

## 2015-05-21 MED ORDER — CEFAZOLIN SODIUM-DEXTROSE 2-3 GM-% IV SOLR
2.0000 g | Freq: Four times a day (QID) | INTRAVENOUS | Status: AC
  Administered 2015-05-21: 2 g via INTRAVENOUS
  Filled 2015-05-21 (×2): qty 50

## 2015-05-21 MED ORDER — SODIUM CHLORIDE 0.9 % IJ SOLN
INTRAMUSCULAR | Status: AC
  Filled 2015-05-21: qty 10

## 2015-05-21 MED ORDER — SODIUM CHLORIDE 0.9 % IV SOLN
INTRAVENOUS | Status: DC | PRN
  Administered 2015-05-21: 60 mL

## 2015-05-21 MED ORDER — OXYCODONE-ACETAMINOPHEN 5-325 MG PO TABS
ORAL_TABLET | ORAL | Status: AC
  Filled 2015-05-21: qty 1

## 2015-05-21 MED ORDER — PREGABALIN 50 MG PO CAPS
50.0000 mg | ORAL_CAPSULE | Freq: Once | ORAL | Status: AC
  Administered 2015-05-21: 50 mg via ORAL

## 2015-05-21 MED ORDER — ACETAMINOPHEN 650 MG RE SUPP: 650.0000 mg | Freq: Four times a day (QID) | RECTAL | Status: DC | PRN

## 2015-05-21 MED ORDER — PREGABALIN 50 MG PO CAPS
ORAL_CAPSULE | ORAL | Status: AC
  Filled 2015-05-21: qty 1

## 2015-05-21 MED ORDER — METOPROLOL TARTRATE 25 MG PO TABS
25.0000 mg | ORAL_TABLET | Freq: Two times a day (BID) | ORAL | Status: DC
  Administered 2015-05-21 – 2015-05-23 (×5): 25 mg via ORAL
  Filled 2015-05-21 (×5): qty 1

## 2015-05-21 MED ORDER — CEFAZOLIN SODIUM-DEXTROSE 2-3 GM-% IV SOLR
INTRAVENOUS | Status: AC
  Filled 2015-05-21: qty 50

## 2015-05-21 MED ORDER — MIDAZOLAM HCL 2 MG/2ML IJ SOLN
INTRAMUSCULAR | Status: AC
  Filled 2015-05-21: qty 4

## 2015-05-21 MED ORDER — DIVALPROEX SODIUM ER 500 MG PO TB24
1000.0000 mg | ORAL_TABLET | Freq: Every day | ORAL | Status: DC
  Administered 2015-05-21 – 2015-05-22 (×2): 1000 mg via ORAL
  Filled 2015-05-21 (×2): qty 2

## 2015-05-21 MED ORDER — MENTHOL 3 MG MT LOZG: 1.0000 | LOZENGE | OROMUCOSAL | Status: DC | PRN

## 2015-05-21 MED ORDER — FENTANYL CITRATE (PF) 100 MCG/2ML IJ SOLN
25.0000 ug | INTRAMUSCULAR | Status: DC
  Administered 2015-05-21: 25 ug via INTRAVENOUS

## 2015-05-21 MED ORDER — POTASSIUM CHLORIDE CRYS ER 20 MEQ PO TBCR
20.0000 meq | EXTENDED_RELEASE_TABLET | Freq: Every day | ORAL | Status: DC
  Administered 2015-05-21 – 2015-05-23 (×3): 20 meq via ORAL
  Filled 2015-05-21 (×3): qty 1

## 2015-05-21 MED ORDER — ALUM & MAG HYDROXIDE-SIMETH 200-200-20 MG/5ML PO SUSP: 30.0000 mL | ORAL | Status: DC | PRN

## 2015-05-21 MED ORDER — GABAPENTIN 300 MG PO CAPS
300.0000 mg | ORAL_CAPSULE | Freq: Three times a day (TID) | ORAL | Status: DC
  Administered 2015-05-21 – 2015-05-23 (×6): 300 mg via ORAL
  Filled 2015-05-21 (×6): qty 1

## 2015-05-21 MED ORDER — ASPIRIN EC 325 MG PO TBEC
325.0000 mg | DELAYED_RELEASE_TABLET | Freq: Two times a day (BID) | ORAL | Status: DC
  Administered 2015-05-21 – 2015-05-23 (×5): 325 mg via ORAL
  Filled 2015-05-21 (×5): qty 1

## 2015-05-21 MED ORDER — METHOCARBAMOL 500 MG PO TABS: 500.0000 mg | ORAL_TABLET | Freq: Four times a day (QID) | ORAL | Status: DC | PRN

## 2015-05-21 MED ORDER — MIDAZOLAM HCL 2 MG/2ML IJ SOLN
INTRAMUSCULAR | Status: AC
  Filled 2015-05-21: qty 2

## 2015-05-21 MED ORDER — ONDANSETRON HCL 4 MG/2ML IJ SOLN
4.0000 mg | Freq: Once | INTRAMUSCULAR | Status: AC
  Administered 2015-05-21: 4 mg via INTRAVENOUS

## 2015-05-21 MED ORDER — LORAZEPAM 1 MG PO TABS
1.0000 mg | ORAL_TABLET | Freq: Three times a day (TID) | ORAL | Status: DC
  Administered 2015-05-21 – 2015-05-23 (×5): 1 mg via ORAL
  Filled 2015-05-21 (×5): qty 1

## 2015-05-21 MED ORDER — BUPIVACAINE-EPINEPHRINE (PF) 0.5% -1:200000 IJ SOLN
INTRAMUSCULAR | Status: DC | PRN
  Administered 2015-05-21: 30 mL via PERINEURAL

## 2015-05-21 MED ORDER — CHLORHEXIDINE GLUCONATE 4 % EX LIQD: 60.0000 mL | Freq: Once | CUTANEOUS | Status: DC

## 2015-05-21 MED ORDER — LISINOPRIL 10 MG PO TABS
20.0000 mg | ORAL_TABLET | Freq: Every day | ORAL | Status: DC
  Administered 2015-05-22 – 2015-05-23 (×2): 20 mg via ORAL
  Filled 2015-05-21 (×2): qty 2

## 2015-05-21 MED ORDER — SODIUM CHLORIDE 0.9 % IV SOLN
INTRAVENOUS | Status: DC
Start: 2015-05-21 — End: 2015-05-23
  Administered 2015-05-21: 16:00:00 via INTRAVENOUS

## 2015-05-21 MED ORDER — CEFAZOLIN SODIUM-DEXTROSE 2-3 GM-% IV SOLR
2.0000 g | INTRAVENOUS | Status: AC
  Administered 2015-05-21: 2 g via INTRAVENOUS

## 2015-05-21 MED ORDER — PANTOPRAZOLE SODIUM 40 MG PO TBEC
40.0000 mg | DELAYED_RELEASE_TABLET | Freq: Every day | ORAL | Status: DC
  Administered 2015-05-21 – 2015-05-23 (×3): 40 mg via ORAL
  Filled 2015-05-21 (×3): qty 1

## 2015-05-21 MED ORDER — BUPIVACAINE IN DEXTROSE 0.75-8.25 % IT SOLN
INTRATHECAL | Status: AC
  Filled 2015-05-21: qty 2

## 2015-05-21 MED ORDER — METOCLOPRAMIDE HCL 10 MG PO TABS: 5.0000 mg | ORAL_TABLET | Freq: Three times a day (TID) | ORAL | Status: DC | PRN

## 2015-05-21 MED ORDER — ONDANSETRON HCL 4 MG PO TABS: 4.0000 mg | ORAL_TABLET | Freq: Four times a day (QID) | ORAL | Status: DC | PRN

## 2015-05-21 MED ORDER — PROPOFOL 10 MG/ML IV BOLUS
INTRAVENOUS | Status: AC
  Filled 2015-05-21: qty 20

## 2015-05-21 MED ORDER — PHENOL 1.4 % MT LIQD: 1.0000 | OROMUCOSAL | Status: DC | PRN

## 2015-05-21 MED ORDER — METHOCARBAMOL 1000 MG/10ML IJ SOLN
500.0000 mg | Freq: Four times a day (QID) | INTRAVENOUS | Status: DC | PRN
  Filled 2015-05-21: qty 5

## 2015-05-21 MED ORDER — HYDROMORPHONE HCL 1 MG/ML IJ SOLN
0.5000 mg | INTRAMUSCULAR | Status: DC | PRN
  Administered 2015-05-21 – 2015-05-22 (×4): 0.5 mg via INTRAVENOUS
  Filled 2015-05-21 (×5): qty 1

## 2015-05-21 MED ORDER — BUPIVACAINE-EPINEPHRINE (PF) 0.5% -1:200000 IJ SOLN
INTRAMUSCULAR | Status: AC
  Filled 2015-05-21: qty 30

## 2015-05-21 MED ORDER — LACTATED RINGERS IV SOLN
INTRAVENOUS | Status: DC
  Administered 2015-05-21 (×2): via INTRAVENOUS

## 2015-05-21 MED ORDER — ACETAMINOPHEN 325 MG PO TABS: 650.0000 mg | ORAL_TABLET | Freq: Four times a day (QID) | ORAL | Status: DC | PRN

## 2015-05-21 MED ORDER — ONDANSETRON HCL 4 MG/2ML IJ SOLN
INTRAMUSCULAR | Status: AC
  Filled 2015-05-21: qty 2

## 2015-05-21 MED ORDER — PHENYLEPHRINE HCL 10 MG/ML IJ SOLN
INTRAMUSCULAR | Status: DC | PRN
  Administered 2015-05-21 (×3): 50 ug via INTRAVENOUS
  Administered 2015-05-21: 75 ug via INTRAVENOUS
  Administered 2015-05-21: 100 ug via INTRAVENOUS
  Administered 2015-05-21 (×2): 50 ug via INTRAVENOUS
  Administered 2015-05-21: 25 ug via INTRAVENOUS

## 2015-05-21 MED ORDER — DEXAMETHASONE SODIUM PHOSPHATE 4 MG/ML IJ SOLN
10.0000 mg | Freq: Once | INTRAMUSCULAR | Status: AC
  Administered 2015-05-22: 10 mg via INTRAVENOUS
  Filled 2015-05-21: qty 3

## 2015-05-21 MED ORDER — MAGNESIUM CITRATE PO SOLN: 1.0000 | Freq: Once | ORAL | Status: DC | PRN

## 2015-05-21 MED ORDER — DOCUSATE SODIUM 100 MG PO CAPS
100.0000 mg | ORAL_CAPSULE | Freq: Two times a day (BID) | ORAL | Status: DC
  Administered 2015-05-21 – 2015-05-23 (×5): 100 mg via ORAL
  Filled 2015-05-21 (×5): qty 1

## 2015-05-21 MED ORDER — FENTANYL CITRATE (PF) 100 MCG/2ML IJ SOLN: 25.0000 ug | INTRAMUSCULAR | Status: DC | PRN

## 2015-05-21 MED ORDER — POLYETHYLENE GLYCOL 3350 17 G PO PACK: 17.0000 g | PACK | Freq: Every day | ORAL | Status: DC

## 2015-05-21 MED ORDER — ATORVASTATIN CALCIUM 20 MG PO TABS
20.0000 mg | ORAL_TABLET | Freq: Every day | ORAL | Status: DC
  Administered 2015-05-21 – 2015-05-22 (×2): 20 mg via ORAL
  Filled 2015-05-21 (×2): qty 1

## 2015-05-21 MED ORDER — OXYCODONE HCL 5 MG PO TABS: 5.0000 mg | ORAL_TABLET | Freq: Once | ORAL | Status: AC

## 2015-05-21 MED ORDER — PROPOFOL INFUSION 10 MG/ML OPTIME
INTRAVENOUS | Status: DC | PRN
  Administered 2015-05-21: 25 ug/kg/min via INTRAVENOUS
  Administered 2015-05-21: 13:00:00 via INTRAVENOUS

## 2015-05-21 MED ORDER — PROPOFOL 10 MG/ML IV BOLUS
INTRAVENOUS | Status: DC | PRN
  Administered 2015-05-21 (×2): 10 mg via INTRAVENOUS

## 2015-05-21 MED ORDER — FENTANYL CITRATE (PF) 100 MCG/2ML IJ SOLN
INTRAMUSCULAR | Status: AC
  Filled 2015-05-21: qty 2

## 2015-05-21 MED ORDER — FENTANYL CITRATE (PF) 100 MCG/2ML IJ SOLN
INTRAMUSCULAR | Status: DC | PRN
  Administered 2015-05-21 (×2): 25 ug via INTRAVENOUS

## 2015-05-21 MED ORDER — MIDAZOLAM HCL 2 MG/2ML IJ SOLN
1.0000 mg | INTRAMUSCULAR | Status: DC | PRN
  Administered 2015-05-21: 2 mg via INTRAVENOUS

## 2015-05-21 MED ORDER — METHOCARBAMOL 1000 MG/10ML IJ SOLN
500.0000 mg | Freq: Once | INTRAMUSCULAR | Status: AC
  Administered 2015-05-21: 500 mg via INTRAVENOUS
  Filled 2015-05-21: qty 5

## 2015-05-21 MED ORDER — SODIUM CHLORIDE 0.9 % IJ SOLN
INTRAMUSCULAR | Status: AC
  Filled 2015-05-21: qty 40

## 2015-05-21 MED ORDER — BUPIVACAINE LIPOSOME 1.3 % IJ SUSP: 20.0000 mL | Freq: Once | INTRAMUSCULAR | Status: DC

## 2015-05-21 MED ORDER — DIPHENHYDRAMINE HCL 12.5 MG/5ML PO ELIX: 12.5000 mg | ORAL_SOLUTION | ORAL | Status: DC | PRN

## 2015-05-21 MED ORDER — OXYCODONE HCL 5 MG PO TABS
5.0000 mg | ORAL_TABLET | ORAL | Status: DC | PRN
  Administered 2015-05-21: 10 mg via ORAL
  Filled 2015-05-21: qty 2

## 2015-05-21 MED ORDER — EPHEDRINE SULFATE 50 MG/ML IJ SOLN
INTRAMUSCULAR | Status: DC | PRN
  Administered 2015-05-21: 5 mg via INTRAVENOUS
  Administered 2015-05-21: 10 mg via INTRAVENOUS

## 2015-05-21 MED ORDER — METOCLOPRAMIDE HCL 5 MG/ML IJ SOLN: 5.0000 mg | Freq: Three times a day (TID) | INTRAMUSCULAR | Status: DC | PRN

## 2015-05-21 MED ORDER — POLYETHYLENE GLYCOL 3350 17 G PO PACK
17.0000 g | PACK | Freq: Every day | ORAL | Status: DC
  Administered 2015-05-21 – 2015-05-22 (×2): 17 g via ORAL
  Filled 2015-05-21 (×3): qty 1

## 2015-05-21 MED ORDER — MIDAZOLAM HCL 5 MG/5ML IJ SOLN
INTRAMUSCULAR | Status: DC | PRN
  Administered 2015-05-21: 1 mg via INTRAVENOUS

## 2015-05-21 SURGICAL SUPPLY — 76 items
BAG HAMPER (MISCELLANEOUS) ×3 IMPLANT
BANDAGE ESMARK 6X9 LF (GAUZE/BANDAGES/DRESSINGS) ×1 IMPLANT
BIT DRILL 3.2X128 (BIT) ×1 IMPLANT
BIT DRILL 3.2X128MM (BIT) ×1
BLADE HEX COATED 2.75 (ELECTRODE) ×3 IMPLANT
BNDG CMPR 9X6 STRL LF SNTH (GAUZE/BANDAGES/DRESSINGS) ×1
BNDG ESMARK 6X9 LF (GAUZE/BANDAGES/DRESSINGS) ×3
BOWL SMART MIX CTS (DISPOSABLE) IMPLANT
CAP KNEE TOTAL 3 SIGMA ×2 IMPLANT
CEMENT HV SMART SET (Cement) ×6 IMPLANT
CHLORAPREP W/TINT 26ML (MISCELLANEOUS) ×4 IMPLANT
CLOTH BEACON ORANGE TIMEOUT ST (SAFETY) ×3 IMPLANT
COOLER CRYO CUFF IC AND MOTOR (MISCELLANEOUS) ×3 IMPLANT
COVER LIGHT HANDLE STERIS (MISCELLANEOUS) ×6 IMPLANT
COVER PROBE W GEL 5X96 (DRAPES) ×3 IMPLANT
CUFF CRYO KNEE LG 20X31 COOLER (ORTHOPEDIC SUPPLIES) ×3 IMPLANT
CUFF CRYO KNEE18X23 MED (MISCELLANEOUS) ×3 IMPLANT
CUFF TOURNIQUET SINGLE 34IN LL (TOURNIQUET CUFF) ×2 IMPLANT
CUFF TOURNIQUET SINGLE 44IN (TOURNIQUET CUFF) IMPLANT
DECANTER SPIKE VIAL GLASS SM (MISCELLANEOUS) ×4 IMPLANT
DRAPE BACK TABLE (DRAPES) ×3 IMPLANT
DRAPE EXTREMITY T 121X128X90 (DRAPE) ×3 IMPLANT
DRSG AQUACEL AG ADV 3.5X10 (GAUZE/BANDAGES/DRESSINGS) ×3 IMPLANT
DRSG MEPILEX BORDER 4X12 (GAUZE/BANDAGES/DRESSINGS) ×1 IMPLANT
DURAPREP 26ML APPLICATOR (WOUND CARE) ×2 IMPLANT
ELECT REM PT RETURN 9FT ADLT (ELECTROSURGICAL) ×3
ELECTRODE REM PT RTRN 9FT ADLT (ELECTROSURGICAL) ×1 IMPLANT
EVACUATOR 3/16  PVC DRAIN (DRAIN) ×2
EVACUATOR 3/16 PVC DRAIN (DRAIN) ×1 IMPLANT
GLOVE BIOGEL M 7.0 STRL (GLOVE) ×4 IMPLANT
GLOVE BIOGEL PI IND STRL 7.0 (GLOVE) IMPLANT
GLOVE BIOGEL PI INDICATOR 7.0 (GLOVE) ×6
GLOVE ECLIPSE 6.5 STRL STRAW (GLOVE) ×2 IMPLANT
GLOVE EXAM NITRILE MD LF STRL (GLOVE) ×4 IMPLANT
GLOVE OPTIFIT SS 8.0 STRL (GLOVE) ×3 IMPLANT
GLOVE SKINSENSE NS SZ8.0 LF (GLOVE) ×4
GLOVE SKINSENSE STRL SZ8.0 LF (GLOVE) ×2 IMPLANT
GLOVE SS N UNI LF 8.5 STRL (GLOVE) ×1 IMPLANT
GOWN STRL REUS W/ TWL LRG LVL3 (GOWN DISPOSABLE) ×1 IMPLANT
GOWN STRL REUS W/TWL LRG LVL3 (GOWN DISPOSABLE) ×9 IMPLANT
GOWN STRL REUS W/TWL XL LVL3 (GOWN DISPOSABLE) ×3 IMPLANT
HANDPIECE INTERPULSE COAX TIP (DISPOSABLE) ×3
HOOD W/PEELAWAY (MISCELLANEOUS) ×12 IMPLANT
INST SET MAJOR BONE (KITS) ×3 IMPLANT
IV NS IRRIG 3000ML ARTHROMATIC (IV SOLUTION) ×3 IMPLANT
KIT BLADEGUARD II DBL (SET/KITS/TRAYS/PACK) ×3 IMPLANT
KIT ROOM TURNOVER APOR (KITS) ×3 IMPLANT
MANIFOLD NEPTUNE II (INSTRUMENTS) ×3 IMPLANT
MARKER SKIN DUAL TIP RULER LAB (MISCELLANEOUS) ×3 IMPLANT
NDL HYPO 21X1.5 SAFETY (NEEDLE) ×1 IMPLANT
NDL HYPO 25X1 1.5 SAFETY (NEEDLE) ×1 IMPLANT
NEEDLE HYPO 21X1.5 SAFETY (NEEDLE) ×3 IMPLANT
NEEDLE HYPO 25X1 1.5 SAFETY (NEEDLE) ×3 IMPLANT
NS IRRIG 1000ML POUR BTL (IV SOLUTION) ×3 IMPLANT
PACK TOTAL JOINT (CUSTOM PROCEDURE TRAY) ×3 IMPLANT
PAD ARMBOARD 7.5X6 YLW CONV (MISCELLANEOUS) ×3 IMPLANT
PAD DANNIFLEX CPM (ORTHOPEDIC SUPPLIES) ×3 IMPLANT
PIN TROCAR 3 INCH (PIN) ×3 IMPLANT
SAW OSC TIP CART 19.5X105X1.3 (SAW) ×3 IMPLANT
SET BASIN LINEN APH (SET/KITS/TRAYS/PACK) ×3 IMPLANT
SET HNDPC FAN SPRY TIP SCT (DISPOSABLE) ×1 IMPLANT
STAPLER VISISTAT 35W (STAPLE) ×3 IMPLANT
SUT BRALON NAB BRD #1 30IN (SUTURE) ×6 IMPLANT
SUT MNCRL 0 VIOLET CTX 36 (SUTURE) IMPLANT
SUT MON AB 0 CT1 (SUTURE) ×5 IMPLANT
SUT MON AB 2-0 CT1 36 (SUTURE) IMPLANT
SUT MONOCRYL 0 CTX 36 (SUTURE) ×2
SYR 20CC LL (SYRINGE) ×6 IMPLANT
SYR 30ML LL (SYRINGE) ×3 IMPLANT
SYR BULB IRRIGATION 50ML (SYRINGE) ×3 IMPLANT
TAPE CLOTH SURG 4X10 WHT LF (GAUZE/BANDAGES/DRESSINGS) ×2 IMPLANT
TOWEL OR 17X26 4PK STRL BLUE (TOWEL DISPOSABLE) ×3 IMPLANT
TOWER CARTRIDGE SMART MIX (DISPOSABLE) ×2 IMPLANT
TRAY FOLEY CATH SILVER 16FR (SET/KITS/TRAYS/PACK) ×3 IMPLANT
WATER STERILE IRR 1000ML POUR (IV SOLUTION) ×8 IMPLANT
YANKAUER SUCT 12FT TUBE ARGYLE (SUCTIONS) ×3 IMPLANT

## 2015-05-21 NOTE — Anesthesia Procedure Notes (Signed)
Spinal Patient location during procedure: OR Start time: 05/21/2015 11:18 AM Staffing Resident/CRNA: Tressie Stalker E Preanesthetic Checklist Completed: patient identified, site marked, surgical consent, pre-op evaluation, timeout performed, IV checked, risks and benefits discussed and monitors and equipment checked Spinal Block Patient position: left lateral decubitus Prep: Betadine Patient monitoring: heart rate, cardiac monitor, continuous pulse ox and blood pressure Approach: left paramedian Location: L3-4 Injection technique: single-shot Needle Needle type: Spinocan  Needle gauge: 22 G Needle length: 9 cm Assessment Sensory level: T8 Additional Notes  ATTEMPTS:1 TRAY [id number] TRAY EXPIRATION DATE:03/2016

## 2015-05-21 NOTE — Anesthesia Preprocedure Evaluation (Signed)
Anesthesia Evaluation  Patient identified by MRN, date of birth, ID band Patient awake    Reviewed: Allergy & Precautions, NPO status , Patient's Chart, lab work & pertinent test results  Airway Mallampati: III  TM Distance: >3 FB   Mouth opening: Limited Mouth Opening  Dental  (+) Poor Dentition   Pulmonary neg pulmonary ROS,  breath sounds clear to auscultation        Cardiovascular hypertension, Pt. on medications Rhythm:Regular Rate:Normal     Neuro/Psych    GI/Hepatic GERD-  Medicated,  Endo/Other    Renal/GU      Musculoskeletal  (+) Arthritis -, Osteoarthritis,    Abdominal   Peds  Hematology  (+) anemia ,   Anesthesia Other Findings   Reproductive/Obstetrics                             Anesthesia Physical Anesthesia Plan  ASA: III  Anesthesia Plan: Spinal   Post-op Pain Management:    Induction:   Airway Management Planned: Simple Face Mask  Additional Equipment:   Intra-op Plan:   Post-operative Plan:   Informed Consent: I have reviewed the patients History and Physical, chart, labs and discussed the procedure including the risks, benefits and alternatives for the proposed anesthesia with the patient or authorized representative who has indicated his/her understanding and acceptance.     Plan Discussed with:   Anesthesia Plan Comments:         Anesthesia Quick Evaluation

## 2015-05-21 NOTE — Anesthesia Postprocedure Evaluation (Addendum)
  Anesthesia Post-op Note  Patient: Katherine Joseph  Procedure(s) Performed: Procedure(s): TOTAL KNEE ARTHROPLASTY (Left)  Patient Location: PACU  Anesthesia Type:Spinal  Level of Consciousness: sedated  Airway and Oxygen Therapy: Patient Spontanous Breathing and Patient connected to face mask oxygen  Post-op Pain: none  Post-op Assessment: Post-op Vital signs reviewed, Patient's Cardiovascular Status Stable, Respiratory Function Stable, Patent Airway and No signs of Nausea or vomiting LLE Motor Response: Non-purposeful movement LLE Sensation: Numbness RLE Motor Response: Non-purposeful movement RLE Sensation: Numbness L Sensory Level: T11 R Sensory Level: T11  Post-op Vital Signs: Reviewed and stable  Last Vitals:  Filed Vitals:   05/21/15 1457  BP: 101/48  Pulse: 72  Temp: 36.4 C  Resp: 18    Complications: No apparent anesthesia complications,  Late entry, patient seen in PACU before transfering to floor.

## 2015-05-21 NOTE — Op Note (Signed)
Surgical dictation for left total knee  Preop diagnosis osteoarthritis left knee  Postop diagnosis osteoarthritis left knee  Surgeon Dr. Aline Brochure [phone]  Assisted by betty Caryl Pina and debbie dallas  Anesthetic spinal  Implants DEPUY  SIGMA PS FB   SIZES:    F 2.5   T 3   P 28 x 9  Poly 12.5PS FB  Drains: one Hemovac drain in the joint   Exparel   Marcaine with epinephrine    Operative findings : EXPOSED BONE MEDIAL LATERAL TIB AND FEMUR     details of procedure:   The patient was identified in the preop holding area and the surgical site was confirmed as the left knee. Chart review and update were completed. The patient was taken to the operating room for spinal anesthesia. After successful spinal anesthesia Foley catheter was inserted. The patient was placed supine on the operating table.   the left leg was prepped with ChloraPrep and draped sterilely. Timeout was completed. The limb was then exsanguinated a  6 inch Esmarch. The tourniquet was elevated to 300 mmHg.   A midline incision was made and taken down to the extensor mechanism followed by medial arthrotomy. The patella was everted. Side effect to me was performed as needed. The osteophytes were resected.  Anterior cruciate ligament and PCL and medial and lateral meniscus were resected.   a 3/8 inch drill bit was used to enter the femoral canal which was suctioned and irrigated until the fluid was clear. The distal femoral cut was set for 11 millimeter resection with a 5   Left Valgus angle. This cut was completed and checked for flatness.   the femur was then measured to a size 2.5.  The cutting block was placed to match the epicondyles and the 4 distal cuts were made.   the tibia was subluxated forward and the external alignment guide was placed. We removed 8 mm of bone from the higher lateral side. We set the guide for neutral varus valgus cut related to the  Mechanical axis of the tibia and for slope matching the  patient's anatomy. Rotational alignment was set using the tibial tubercle, tibial spine and second metatarsal. The cutting block was pinned and the proximal tibia was resected.    spacer blocks were placed starting with a 10 mm insert to confirm equal flexion-extension gaps. A size  12.5  mm insert balanced the gaps.   We placed the femoral notch cutting guide size 2.5  and resected the notch.   Trial implants replaced using appropriate size femur , appropriate size tibial baseplate which was measured after the proximal tibia resection. Tibial rotation was set patella tracking was normal   The tibia was then punched per manufacture technique making sure to avoid internal rotation.   The patella measured a size 23   We resected down to a size 38 X 9 using a size 38 button.   Final range of motion check was performed with the appropriate size trials as mentioned above. Satisfactory reduction and motion were obtained.   Trial implants were removed. The bone was irrigated and dried and the cement was mixed on the back table  These implants were then cemented in place. Excess cement was removed. The cement was allowed to cure. Second irrigation was performed.    FInal range of motion check and stability check was completed  The wound was irrigated third time Hemovac drain was placed, extensor mechanism was closed with #1 Nurolon followed by 0  Monocryl and staples to reapproximate the skin edges and subcutaneous tissue.   Sterile dressing was applied  The patient was taken recovery in stable condition

## 2015-05-21 NOTE — Progress Notes (Signed)
1621 Patient OOB up in chair at this time with assistance. Denies any c/o pain or discomfort at this time. Tolerated standing and bending LEFT knee well.

## 2015-05-21 NOTE — Progress Notes (Signed)
1455 patient arrived to 300 unit room# 304 A&O, able to make needs known. BP-101/48, O2-100% 2L, P-72, R-18, T-97.6 (oral). Denies c/o pain at this time. Family at the bedside.

## 2015-05-21 NOTE — Interval H&P Note (Signed)
History and Physical Interval Note:  05/21/2015 10:34 AM  Katherine Joseph  has presented today for surgery, with the diagnosis of left knee osteoarthritis  The various methods of treatment have been discussed with the patient and family. After consideration of risks, benefits and other options for treatment, the patient has consented to  Procedure(s): TOTAL KNEE ARTHROPLASTY (Left) as a surgical intervention .  The patient's history has been reviewed, patient examined, no change in status, stable for surgery.  I have reviewed the patient's chart and labs.  Questions were answered to the patient's satisfaction.     Arther Abbott

## 2015-05-21 NOTE — Brief Op Note (Signed)
05/21/2015  1:16 PM  PATIENT:  Katherine Joseph  80 y.o. female  PRE-OPERATIVE DIAGNOSIS:  left knee osteoarthritis  POST-OPERATIVE DIAGNOSIS:  left knee osteoarthritis  PROCEDURE:  Procedure(s): TOTAL KNEE ARTHROPLASTY (Left)  SURGEON:  Surgeon(s) and Role:    * Carole Civil, MD - Primary  PHYSICIAN ASSISTANT:   ASSISTANTS: betty ashley and debbie dallas   ANESTHESIA:   spinal  EBL:  Total I/O In: 1400 [I.V.:1400] Out: 250 [Urine:200; Blood:50]  BLOOD ADMINISTERED:none  DRAINS:1 hemovac  LOCAL MEDICATIONS USED:  MARCAINE and OTHER exparel  SPECIMEN:  No Specimen  DISPOSITION OF SPECIMEN:  N/A  COUNTS:  YES  TOURNIQUET:   Total Tourniquet Time Documented: Thigh (Left) - 85 minutes Total: Thigh (Left) - 85 minutes   DICTATION: .Viviann Spare Dictation  PLAN OF CARE: Admit to inpatient   PATIENT DISPOSITION:  PACU - hemodynamically stable.   Delay start of Pharmacological VTE agent (>24hrs) due to surgical blood loss or risk of bleeding: yes

## 2015-05-21 NOTE — Transfer of Care (Signed)
Immediate Anesthesia Transfer of Care Note  Patient: Katherine Joseph  Procedure(s) Performed: Procedure(s): TOTAL KNEE ARTHROPLASTY (Left)  Patient Location: PACU  Anesthesia Type:Spinal  Level of Consciousness: awake, alert  and oriented  Airway & Oxygen Therapy: Patient Spontanous Breathing and Patient connected to nasal cannula oxygen  Post-op Assessment: Report given to RN  Post vital signs: Reviewed and stable  Last Vitals:  Filed Vitals:   05/21/15 1055  BP: 111/60  Pulse:   Temp:   Resp: 15    Complications: No apparent anesthesia complications, spinal level, T10

## 2015-05-22 ENCOUNTER — Encounter (HOSPITAL_COMMUNITY): Payer: Self-pay | Admitting: Orthopedic Surgery

## 2015-05-22 LAB — BASIC METABOLIC PANEL
ANION GAP: 7 (ref 5–15)
BUN: 12 mg/dL (ref 6–20)
CALCIUM: 7.9 mg/dL — AB (ref 8.9–10.3)
CO2: 30 mmol/L (ref 22–32)
Chloride: 96 mmol/L — ABNORMAL LOW (ref 101–111)
Creatinine, Ser: 0.89 mg/dL (ref 0.44–1.00)
GFR calc Af Amer: >60 mL/min (ref >60)
GLUCOSE: 107 mg/dL — AB (ref 65–99)
POTASSIUM: 4.3 mmol/L (ref 3.5–5.1)
SODIUM: 133 mmol/L — AB (ref 135–145)

## 2015-05-22 LAB — CBC
HCT: 31.7 % — ABNORMAL LOW (ref 36.0–46.0)
Hemoglobin: 10.2 g/dL — ABNORMAL LOW (ref 12.0–15.0)
MCH: 24.5 pg — AB (ref 26.0–34.0)
MCHC: 32.2 g/dL (ref 30.0–36.0)
MCV: 76.2 fL — AB (ref 78.0–100.0)
PLATELETS: 203 10*3/uL (ref 150–400)
RBC: 4.16 MIL/uL (ref 3.87–5.11)
RDW: 14.4 % (ref 11.5–15.5)
WBC: 13.5 10*3/uL — AB (ref 4.0–10.5)

## 2015-05-22 NOTE — Clinical Social Work Placement (Signed)
   CLINICAL SOCIAL WORK PLACEMENT  NOTE  Date:  05/22/2015  Patient Details  Name: Katherine Joseph MRN: [id number] Date of Birth: Jun 21, 1935  Clinical Social Work is seeking post-discharge placement for this patient at the [place] level of care (*CSW will initial, date and re-position this form in  chart as items are completed):  Yes   Patient/family provided with [place] Work Department's list of facilities offering this level of care within the geographic area requested by the patient (or if unable, by the patient's family).  Yes   Patient/family informed of their freedom to choose among providers that offer the needed level of care, that participate in Medicare, Medicaid or managed care program needed by the patient, have an available bed and are willing to accept the patient.  Yes   Patient/family informed of [place]'s ownership interest in [facility] and [facility], as well as of the fact that they are under no obligation to receive care at these facilities.  PASRR submitted to EDS on 05/22/15     PASRR number received on 05/22/15     Existing PASRR number confirmed on       FL2 transmitted to all facilities in geographic area requested by pt/family on 05/22/15     FL2 transmitted to all facilities within larger geographic area on       Patient informed that his/her managed care company has contracts with or will negotiate with certain facilities, including the following:            Patient/family informed of bed offers received.  Patient chooses bed at       Physician recommends and patient chooses bed at      Patient to be transferred to   on  .  Patient to be transferred to facility by       Patient family notified on   of transfer.  Name of family member notified:        PHYSICIAN       Additional Comment:    _______________________________________________ Salome Arnt, [place] 05/22/2015, 11:02  AM [phone]

## 2015-05-22 NOTE — Addendum Note (Signed)
Addendum  created 05/22/15 0856 by Mickel Baas, CRNA   Modules edited: Notes Section   Notes Section:  File: [id number]; File: [id number]

## 2015-05-22 NOTE — Clinical Social Work Note (Signed)
Clinical Social Work Assessment  Patient Details  Name: Katherine Joseph MRN: [id number] Date of Birth: [birth year]  Date of referral:  05/22/15               Reason for consult:  Facility Placement                Permission sought to share information with:  Family Supports Permission granted to share information::  Yes, Verbal Permission Granted  Name::     Chief Operating Officer::     Relationship::  daughter-in-law  Contact Information:     Housing/Transportation Living arrangements for the past 2 months:  Single Family Home Source of Information:  Patient Patient Interpreter Needed:  None Criminal Activity/Legal Involvement Pertinent to Current Situation/Hospitalization:  No - Comment as needed Significant Relationships:  Adult Children Lives with:  Self Do you feel safe going back to the place where you live?  No Need for family participation in patient care:  Yes (Comment)  Care giving concerns:  Pt lives alone.   Social Worker assessment / plan:  CSW met with pt and pt's daughter-in-law, Anne Ng with pt's permission. Pt alert and oriented and reports she lives alone. Pt post-op day 1 after total knee. Pt's son and daughter-in-law live in [place] and she has a daughter who lives in [place]. At baseline, pt is independent and still drives. She uses a cane outside the home. She indicates she has already worked with PT and recommendation is for SNF. CSW discussed SNF placement process, including authorization requirement. Pt agrees to initiate bed search at [facility] only at this point.    Employment status:    Nurse, adult PT Recommendations:  [place] / Referral to community resources:  [place]  Patient/Family's Response to care:  Pt requests SNF at d/c. Daughter-in-law surprised, but also agreeable.   Patient/Family's Understanding of and Emotional Response to Diagnosis, Current Treatment, and Prognosis:  Pt  aware of treatment plan and recommendation for SNF. She appears to be very realistic about goals and feels it would be best to go to SNF before returning home since she lives alone.   Emotional Assessment Appearance:  Appears stated age Attitude/Demeanor/Rapport:  Other (Cooperative) Affect (typically observed):  Appropriate Orientation:  Oriented to Self, Oriented to Place, Oriented to  Time, Oriented to Situation Alcohol / Substance use:  Not Applicable Psych involvement (Current and /or in the community):     Discharge Needs  Concerns to be addressed:  Discharge Planning Concerns Readmission within the last 30 days:  No Current discharge risk:  Lives alone Barriers to Discharge:  Continued Medical Work up   [employer], [employer], [place] 05/22/2015, 11:04 AM [phone]

## 2015-05-22 NOTE — Care Management Note (Signed)
Case Management Note  Patient Details  Name: Katherine Joseph MRN: [id number] Date of Birth: October 10, 1934  Expected Discharge Date:    05/24/2015               Expected Discharge Plan:  [place]  In-House Referral:  Clinical Social Work  Discharge planning Services  CM Consult  Post Acute Care Choice:  NA Choice offered to:  NA  DME Arranged:    DME Agency:     HH Arranged:    [place] Agency:     Status of Service:  Completed, signed off  Medicare Important Message Given:    Date Medicare IM Given:    Medicare IM give by:    Date Additional Medicare IM Given:    Additional Medicare Important Message give by:     If discussed at [place] of Stay Meetings, dates discussed:    Additional Comments: Pt is from home, lives alone. Pt is s/p knee replacement. PT has recommended SNF and pt is agreeable to placement. CSW has been notified of referral and is working with patient on placement. No CM needs noted.  Sherald Barge, RN 05/22/2015, 11:18 AM

## 2015-05-22 NOTE — Evaluation (Signed)
Physical Therapy Evaluation Patient Details Name: Katherine Joseph MRN: [id number] DOB: 07-25-1935 Today's Date: 05/22/2015   History of Present Illness  HPI: Katherine Joseph, 80 y.o. female, has a history of pain and functional disability in the left knee due to arthritis and has failed non-surgical conservative treatments for greater than 12 weeks to includeNSAID's and/or analgesics, corticosteriod injections, use of assistive devices and activity modification. Onset of symptoms was gradual, starting >10 years ago with gradually worsening course since that time. The patient noted no past surgery on the left knee(s). Patient currently rates pain in the left knee(s) at 8 out of 10 with activity. Patient has night pain, worsening of pain with activity and weight bearing, pain that interferes with activities of daily living, pain with passive range of motion, crepitus and joint swelling. Patient has evidence of subchondral cysts, subchondral sclerosis, periarticular osteophytes and joint space narrowing by imaging studies There is no active infection.  Pt had a left TKR on 05-21-15.  Clinical Impression   Pt was seen for evaluation.  She was alert and oriented, cooperative.  She was very nervous about beginning PT and her LLE was positioned in a "frog leg" position.  Pt normally lives alone and is independent with a cane.  She states that her right knee is very degenerated also.  We initiated instruction in TKR precautions with emphasis on maintaining full knee extension when supine.  We initiated Therapeutic exercise per protocol and were able to achieve 15-95 degree AA ROM of the knee.  She has significant weakness of the quadriceps and needed assist with SAQ.  Pt was instructed in gait with a walker, WBAT left, and was able to ambulate 35' with a walker, no increased pain.  Because she lives alone with limited family support, has steps into the home with no handrail and lacks confidence with her ability  to work through this rehab, I am recommending SNF at d/c.  Pt is in agreement.     Follow Up Recommendations SNF    Equipment Recommendations  None recommended by PT    Recommendations for Other Services   none    Precautions / Restrictions Precautions Precautions: Fall;Knee Precaution Booklet Issued: No Restrictions Weight Bearing Restrictions: No Other Position/Activity Restrictions: WBAT left      Mobility  Bed Mobility Overal bed mobility: Needs Assistance Bed Mobility: Supine to Sit     Supine to sit: Min assist;HOB elevated     General bed mobility comments: assist needed to transfer LLE out of bed  Transfers Overall transfer level: Needs assistance Equipment used: Rolling walker (2 wheeled) Transfers: Sit to/from Stand Sit to Stand: Min guard         General transfer comment: instructed in hand placement for facilitation of standing  Ambulation/Gait Ambulation/Gait assistance: Supervision Ambulation Distance (Feet): 35 Feet Assistive device: Rolling walker (2 wheeled) Gait Pattern/deviations: Step-through pattern;Decreased step length - left;Decreased stance time - left   Gait velocity interpretation: <1.8 ft/sec, indicative of risk for recurrent falls    Stairs            Wheelchair Mobility    Modified Rankin (Stroke Patients Only)       Balance Overall balance assessment: No apparent balance deficits (not formally assessed)  Pertinent Vitals/Pain Pain Assessment: 0-10 Pain Score: 7  Pain Location: left knee Pain Descriptors / Indicators: Aching Pain Intervention(s): Limited activity within patient's tolerance;Premedicated before session;Repositioned;Patient requesting pain meds-RN notified;Ice applied    Home Living Family/patient expects to be discharged to:: Skilled nursing facility Living Arrangements: Alone                    Prior Function Level of  Independence: Independent with assistive device(s)         Comments: ambulted with a cane     Hand Dominance        Extremity/Trunk Assessment               Lower Extremity Assessment: LLE deficits/detail   LLE Deficits / Details: left knee ROM= 15-95 degrees AA...strength impaired in the knee due to surgery     Communication   Communication: No difficulties  Cognition Arousal/Alertness: Awake/alert Behavior During Therapy: WFL for tasks assessed/performed Overall Cognitive Status: Within Functional Limits for tasks assessed                      General Comments      Exercises Total Joint Exercises Ankle Circles/Pumps: AROM;Both;10 reps;Supine Quad Sets: AROM;Both;10 reps;Supine Gluteal Sets: AROM;Both;10 reps;Supine Short Arc Quad: AAROM;Left;10 reps;Supine Heel Slides: AAROM;Left;10 reps;Supine Goniometric ROM: 15-95 degrees, AA left knee      Assessment/Plan    PT Assessment Patient needs continued PT services  PT Diagnosis Difficulty walking;Abnormality of gait;Acute pain   PT Problem List Decreased strength;Decreased range of motion;Decreased activity tolerance;Decreased mobility;Decreased knowledge of use of DME;Decreased knowledge of precautions;Pain  PT Treatment Interventions DME instruction;Gait training;Functional mobility training;Therapeutic exercise   PT Goals (Current goals can be found in the Care Plan section) Acute Rehab PT Goals Patient Stated Goal: to be pain free PT Goal Formulation: With patient/family Time For Goal Achievement: 06/05/15 Potential to Achieve Goals: Good    Frequency BID   Barriers to discharge Decreased caregiver support;Inaccessible home environment pt lives alone, 2 steps into home with no handrail    Co-evaluation               End of Session Equipment Utilized During Treatment: Gait belt Activity Tolerance: Patient tolerated treatment well;No increased pain;Patient limited by  fatigue Patient left: in chair;with call bell/phone within reach;with family/visitor present Nurse Communication: Mobility status         Time: 8366-2947 PT Time Calculation (min) (ACUTE ONLY): 37 min   Charges:   PT Evaluation $Initial PT Evaluation Tier I: 1 Procedure PT Treatments $Therapeutic Exercise: 8-22 mins   PT G CodesSable Feil  PT 05/22/2015, 10:35 AM [phone]

## 2015-05-22 NOTE — Progress Notes (Signed)
Postop day 1 status post left total knee  Patient doing well  BP 119/71 mmHg  Pulse 87  Temp(Src) 99.8 F (37.7 C) (Oral)  Resp 16  Ht 5\' 9"  (1.753 m)  Wt 199 lb 1.6 oz (90.311 kg)  BMI 29.39 kg/m2  SpO2 98%  She did have some mild confusion last night but she has some opiate tolerant secondary to preoperative opiate therapy  Plan today is for physical therapy, CPM machine comment decrease IV fluids. Weight-bear as tolerated. Start discharge planning

## 2015-05-22 NOTE — Anesthesia Postprocedure Evaluation (Addendum)
  Anesthesia Post-op Note  Patient: Katherine Joseph  Procedure(s) Performed: Procedure(s): TOTAL KNEE ARTHROPLASTY (Left)  Patient Location: Room 304  Anesthesia Type:Spinal  Level of Consciousness: awake, alert , oriented and patient cooperative;Mild confusion last night  Airway and Oxygen Therapy: Patient Spontanous Breathing  Post-op Pain: mild  Post-op Assessment: Post-op Vital signs reviewed, Patient's Cardiovascular Status Stable, Respiratory Function Stable, Patent Airway, No signs of Nausea or vomiting and Pain level controlled RLE Sensation: Full Sensation LLE Sensation: Full Sensation RLE Movement: Purposeful movement LLE Movement: Purposeful movement Post-op Vital Signs: Reviewed and stable  Last Vitals:  Filed Vitals:   05/22/15 0608  BP: 119/71  Pulse: 87  Temp: 37.7 C  Resp: 16    Complications: No apparent anesthesia complications

## 2015-05-22 NOTE — Progress Notes (Signed)
Physical Therapy Treatment Patient Details Name: Katherine Joseph MRN: [id number] DOB: [birth year] Today's Date: 05/22/2015    History of Present Illness HPI: Katherine Joseph, 80 y.o. female, has a history of pain and functional disability in the left knee due to arthritis and has failed non-surgical conservative treatments for greater than 12 weeks to includeNSAID's and/or analgesics, corticosteriod injections, use of assistive devices and activity modification. Onset of symptoms was gradual, starting >10 years ago with gradually worsening course since that time. The patient noted no past surgery on the left knee(s). Patient currently rates pain in the left knee(s) at 8 out of 10 with activity. Patient has night pain, worsening of pain with activity and weight bearing, pain that interferes with activities of daily living, pain with passive range of motion, crepitus and joint swelling. Patient has evidence of subchondral cysts, subchondral sclerosis, periarticular osteophytes and joint space narrowing by imaging studies There is no active infection.    PT Comments    Pt was pleasant and cooperative with PT this afternoon.  She reported min pain this PM.  She was instructed in transfers and gait with a walker, gait pattern improved to close to normal.  She requires min assist to transfer sit to supine.  Pt tolerated therapeutic exercise well with ROM of the left knee 10-95 degrees, AA.  The CPM was set up and it was started for the pt at 0-60 degrees.  She tolerated it well.  She has been instructed in the use of the cryocuff for edema control.  Follow Up Recommendations  SNF     Equipment Recommendations  None recommended by PT    Recommendations for Other Services  none     Precautions / Restrictions Precautions Precautions: Fall;Knee Precaution Booklet Issued: No Restrictions Weight Bearing Restrictions: No Other Position/Activity Restrictions: WBAT left    Mobility  Bed  Mobility Overal bed mobility: Needs Assistance Bed Mobility: Sit to Supine     Supine to sit: Min assist        Transfers Overall transfer level: Needs assistance Equipment used: Standard walker Transfers: Sit to/from Stand Sit to Stand: Min guard            Ambulation/Gait Ambulation/Gait assistance: Supervision Ambulation Distance (Feet): 35 Feet Assistive device: Rolling walker (2 wheeled) Gait Pattern/deviations: WFL(Within Functional Limits) Gait velocity: appropriate for situation       Stairs            Wheelchair Mobility    Modified Rankin (Stroke Patients Only)       Balance Overall balance assessment: No apparent balance deficits (not formally assessed)                                  Cognition Arousal/Alertness: Awake/alert Behavior During Therapy: WFL for tasks assessed/performed Overall Cognitive Status: Within Functional Limits for tasks assessed                      Exercises Total Joint Exercises Ankle Circles/Pumps: AROM;Both;10 reps;Supine Quad Sets: AROM;Both;10 reps;Supine Gluteal Sets: AROM;Both;10 reps;Supine Short Arc Quad: AAROM;Left;10 reps;Supine Heel Slides: AAROM;Left;10 reps;Supine Goniometric ROM: 10-95 degrees left knee AA    General Comments        Pertinent Vitals/Pain Pain Assessment: No/denies pain    Home Living                      Prior Function  PT Goals (current goals can now be found in the care plan section) Progress towards PT goals: Progressing toward goals    Frequency  BID    PT Plan Current plan remains appropriate    Co-evaluation             End of Session Equipment Utilized During Treatment: Gait belt Activity Tolerance: Patient tolerated treatment well;No increased pain Patient left: in bed;in CPM     Time: 1500-1550 PT Time Calculation (min) (ACUTE ONLY): 50 min  Charges:  $Gait Training: 8-22 mins $Therapeutic Exercise:  8-22 mins $Therapeutic Activity: 8-22 mins                    G CodesSable Feil  PT 05/22/2015, 3:59 PM [phone]

## 2015-05-22 NOTE — Progress Notes (Signed)
Patient has ambulated with walker to bathroom to void x 2 times this shift

## 2015-05-22 NOTE — Evaluation (Addendum)
Occupational Therapy Evaluation Patient Details Name: Katherine Joseph MRN: [id number] DOB: May 25, 1935 Today's Date: 05/22/2015    History of Present Illness HPI: Katherine Joseph, 80 y.o. female, has a history of pain and functional disability in the left knee due to arthritis and has failed non-surgical conservative treatments for greater than 12 weeks to includeNSAID's and/or analgesics, corticosteriod injections, use of assistive devices and activity modification. Onset of symptoms was gradual, starting >10 years ago with gradually worsening course since that time. The patient noted no past surgery on the left knee(s). Patient currently rates pain in the left knee(s) at 8 out of 10 with activity. Patient has night pain, worsening of pain with activity and weight bearing, pain that interferes with activities of daily living, pain with passive range of motion, crepitus and joint swelling. Patient has evidence of subchondral cysts, subchondral sclerosis, periarticular osteophytes and joint space narrowing by imaging studies There is no active infection.   Clinical Impression   Pt awake, alert, and oriented, complaining of knee pain this am. Pt expressed anxiety over beginning physical therapy on her knee and declined to sit at EOB or perform bed mobility tasks for fear of increased pain during OT evaluation. Pt daughter present & confirms pt was independent in ADL tasks prior to TKA, and has family living next door who check on pt regularly. Pt states she would like to go to short-term rehab when she leaves the hospital. Recommend SNF on discharge due to decreased independence and safety during daily tasks, as well as limited family support during the day. Recommend OT evaluation at SNF.      Follow Up Recommendations  SNF    Equipment Recommendations  None recommended by OT       Precautions / Restrictions Precautions Precautions: Fall;Knee Precaution Booklet Issued:  No Restrictions Weight Bearing Restrictions: No Other Position/Activity Restrictions: WBAT left      Mobility Bed Mobility Overal bed mobility: Needs Assistance Bed Mobility: Supine to Sit     Supine to sit: Min assist;HOB elevated     General bed mobility comments: Pt declined to complete bed mobility task this am due to increased pain & anticipation of PT this am.   Transfers Overall transfer level: Needs assistance Equipment used: Rolling walker (2 wheeled) Transfers: Sit to/from Stand Sit to Stand: Min guard         General transfer comment: unable to assess    Balance Overall balance assessment: No apparent balance deficits (not formally assessed)                                          ADL Overall ADL's : Needs assistance/impaired                                       General ADL Comments: Pt declined to attempt ADL tasks this session due to pain. Pt will require assistance during ADL tasks such as LB dressing and bathing tasks immediately post TKA.      Vision Vision Assessment?: No apparent visual deficits          Pertinent Vitals/Pain Pain Assessment: 0-10 Pain Score: 6  Pain Location: left knee Pain Descriptors / Indicators: Aching Pain Intervention(s): Limited activity within patient's tolerance     Hand Dominance Right   Extremity/Trunk  Assessment Upper Extremity Assessment Upper Extremity Assessment: Generalized weakness   Lower Extremity Assessment Lower Extremity Assessment: Defer to PT evaluation LLE Deficits / Details: left knee ROM= 15-95 degrees AA...strength impaired in the knee due to surgery       Communication Communication Communication: No difficulties   Cognition Arousal/Alertness: Awake/alert Behavior During Therapy: WFL for tasks assessed/performed Overall Cognitive Status: Within Functional Limits for tasks assessed                                Home Living  Family/patient expects to be discharged to:: [place]: Alone                                      Prior Functioning/Environment Level of Independence: Independent with assistive device(s)        Comments: cane for ambulation       OT Problem List: Decreased knowledge of use of DME or AE;Pain             End of Session    Activity Tolerance: Patient limited by pain Patient left: in bed;with call bell/phone within reach;with bed alarm set;with family/visitor present   Time: 9798-9211 OT Time Calculation (min): 17 min Charges:  OT General Charges $OT Visit: 1 Procedure OT Evaluation $Initial OT Evaluation Tier I: 1 Procedure  Guadelupe Sabin, OTR/L  [phone]  05/22/2015, 12:03 PM

## 2015-05-23 ENCOUNTER — Inpatient Hospital Stay
Admission: RE | Admit: 2015-05-23 | Discharge: 2015-06-11 | Disposition: A | Discharge status: Home / Self Care | Payer: Medicare HMO | Source: Ambulatory Visit | Attending: Internal Medicine | Admitting: Internal Medicine

## 2015-05-23 LAB — CBC
HEMATOCRIT: 28.1 % — AB (ref 36.0–46.0)
HEMOGLOBIN: 8.9 g/dL — AB (ref 12.0–15.0)
MCH: 23.5 pg — AB (ref 26.0–34.0)
MCHC: 31.7 g/dL (ref 30.0–36.0)
MCV: 74.3 fL — ABNORMAL LOW (ref 78.0–100.0)
Platelets: 183 10*3/uL (ref 150–400)
RBC: 3.78 MIL/uL — ABNORMAL LOW (ref 3.87–5.11)
RDW: 14.3 % (ref 11.5–15.5)
WBC: 15.9 10*3/uL — ABNORMAL HIGH (ref 4.0–10.5)

## 2015-05-23 MED ORDER — LORAZEPAM 1 MG PO TABS: 1.0000 mg | ORAL_TABLET | Freq: Three times a day (TID) | ORAL | Status: DC

## 2015-05-23 MED ORDER — HYDROCODONE-ACETAMINOPHEN 7.5-325 MG PO TABS: 1.0000 | ORAL_TABLET | ORAL | Status: DC | PRN

## 2015-05-23 NOTE — Progress Notes (Deleted)
Physical Therapy Treatment Patient Details Name: Katherine Joseph MRN: [id number] DOB: Nov 29, 1934 Today's Date: 05/23/2015    History of Present Illness HPI: Katherine Joseph, 80 y.o. female, has a history of pain and functional disability in the left knee due to arthritis and has failed non-surgical conservative treatments for greater than 12 weeks to includeNSAID's and/or analgesics, corticosteriod injections, use of assistive devices and activity modification. Onset of symptoms was gradual, starting >10 years ago with gradually worsening course since that time. The patient noted no past surgery on the left knee(s). Patient currently rates pain in the left knee(s) at 8 out of 10 with activity. Patient has night pain, worsening of pain with activity and weight bearing, pain that interferes with activities of daily living, pain with passive range of motion, crepitus and joint swelling. Patient has evidence of subchondral cysts, subchondral sclerosis, periarticular osteophytes and joint space narrowing by imaging studies There is no active infection.    PT Comments     Pt is progressing very well and is extremely well motivated.  He reports no pain at rest in the right knee and has not had any pain med since yesterday.  He has minimal visible edema in the knee and has 10-104 degrees AA ROM.  He is transferring well out of bed and able to ambulate 200' with a walker, stable gait pattern and close to full weight bearing.  I am continuing instruction in exercise program and management of knee at home.  We still need to instruct in steps and pt has expressed that he would like to be able to stay in the hospital until tomorrow.  Given his age I think that this is very reasonable.  Follow Up Recommendations  SNF     Equipment Recommendations  Rolling walker with 5" wheels (pt has been borrowing a very old walker...he would benefit from having a newer walker with larger wheels)    Recommendations for  Other Services  none     Precautions / Restrictions Precautions Precautions: Fall;Knee Restrictions Weight Bearing Restrictions: No    Mobility  Bed Mobility Overal bed mobility: Modified Independent Bed Mobility: Supine to Sit     Supine to sit: Modified independent (Device/Increase time)        Transfers Overall transfer level: Modified independent Equipment used: Rolling walker (2 wheeled) Transfers: Sit to/from Stand Sit to Stand: Supervision            Ambulation/Gait       Gait Pattern/deviations: WFL(Within Functional Limits) Gait velocity: pt needed cues to slow his gait  Gait velocity interpretation: >2.62 ft/sec, indicative of independent community ambulator General Gait Details: almost fully weight bearing on the RLE   Stairs            Wheelchair Mobility    Modified Rankin (Stroke Patients Only)       Balance Overall balance assessment: No apparent balance deficits (not formally assessed)                                  Cognition Arousal/Alertness: Awake/alert Behavior During Therapy: WFL for tasks assessed/performed Overall Cognitive Status: Within Functional Limits for tasks assessed                      Exercises Total Joint Exercises Ankle Circles/Pumps: AROM;Both;10 reps;Supine Gluteal Sets: AROM;Both;10 reps;Supine Short Arc Quad: AAROM;Left;10 reps;Supine Goniometric ROM: 10-104 degrees right knee AA  General Comments        Pertinent Vitals/Pain Pain Assessment: No/denies pain    Home Living                      Prior Function            PT Goals (current goals can now be found in the care plan section) Progress towards PT goals: Progressing toward goals    Frequency  BID    PT Plan Current plan remains appropriate    Co-evaluation             End of Session Equipment Utilized During Treatment: Gait belt Activity Tolerance: Patient tolerated treatment well;No  increased pain Patient left: in chair;with call bell/phone within reach;with family/visitor present     Time: 9355-2174 PT Time Calculation (min) (ACUTE ONLY): 38 min  Charges:  $Gait Training: 8-22 mins $Therapeutic Exercise: 8-22 mins $Self Care/Home Management: 8-22                    G CodesSable Feil  PT 05/23/2015, 10:39 AM [phone]

## 2015-05-23 NOTE — Care Management Note (Signed)
Case Management Note  Patient Details  Name: Katherine Joseph MRN: [id number] Date of Birth: 05/30/1935   Expected Discharge Date:                  Expected Discharge Plan:  Skilled Nursing Facility  In-House Referral:  Clinical Social Work  Discharge planning Services  CM Consult  Post Acute Care Choice:  NA Choice offered to:  NA  DME Arranged:    DME Agency:     HH Arranged:    [place] Agency:     Status of Service:  Completed, signed off  Medicare Important Message Given:  N/A - LOS <3 / Initial given by admissions Date Medicare IM Given:    Medicare IM give by:    Date Additional Medicare IM Given:    Additional Medicare Important Message give by:     If discussed at [place] of Stay Meetings, dates discussed:    Additional Comments: Pt discharging to SNF today. CSW has arranged for placement. Pt and RN aware of DC plan. No CM needs.  Sherald Barge, RN 05/23/2015, 11:51 AM

## 2015-05-23 NOTE — Progress Notes (Signed)
Postoperative note  Postoperative day number  2  Status post left total knee arthroplasty  Vital signs  BP 122/64 mmHg  Pulse 89  Temp(Src) 98.2 F (36.8 C) (Oral)  Resp 18  Ht 5\' 9"  (1.753 m)  Wt 199 lb 1.6 oz (90.311 kg)  BMI 29.39 kg/m2  SpO2 96%   Pertinent labs   CBC Latest Ref Rng 05/23/2015 05/22/2015 05/16/2015  WBC 4.0 - 10.5 K/uL 15.9(H) 13.5(H) 10.9(H)  Hemoglobin 12.0 - 15.0 g/dL 8.9(L) 10.2(L) 11.7(L)  Hematocrit 36.0 - 46.0 % 28.1(L) 31.7(L) 35.7(L)  Platelets 150 - 400 K/uL 183 203 229      Patient complaints  mild confusion per family  Physical exam  dressing dry no leg edema supple Homans sign negative  Assessment and plan   Patient stable for discharge to the [facility]

## 2015-05-23 NOTE — Clinical Social Work Placement (Signed)
   CLINICAL SOCIAL WORK PLACEMENT  NOTE  Date:  05/23/2015  Patient Details  Name: Katherine Joseph MRN: [id number] Date of Birth: [birth year]  Clinical Social Work is seeking post-discharge placement for this patient at the [place] level of care (*CSW will initial, date and re-position this form in  chart as items are completed):  Yes   Patient/family provided with [place] Work Department's list of facilities offering this level of care within the geographic area requested by the patient (or if unable, by the patient's family).  Yes   Patient/family informed of their freedom to choose among providers that offer the needed level of care, that participate in Medicare, Medicaid or managed care program needed by the patient, have an available bed and are willing to accept the patient.  Yes   Patient/family informed of [facility]'s ownership interest in [facility] and [facility], as well as of the fact that they are under no obligation to receive care at these facilities.  PASRR submitted to EDS on 05/22/15     PASRR number received on 05/22/15     Existing PASRR number confirmed on       FL2 transmitted to all facilities in geographic area requested by pt/family on 05/22/15     FL2 transmitted to all facilities within larger geographic area on       Patient informed that his/her managed care company has contracts with or will negotiate with certain facilities, including the following:        Yes   Patient/family informed of bed offers received.  Patient chooses bed at [facility]     Physician recommends and patient chooses bed at      Patient to be transferred to [facility] on 05/23/15.  Patient to be transferred to facility by staff     Patient family notified on 05/23/15 of transfer.  Name of family member notified:  Hilda Blades- daughter     PHYSICIAN       Additional Comment:  Per [facility], [employer] authorization  received.   _______________________________________________ Salome Arnt, [place] 05/23/2015, 9:43 AM [phone]

## 2015-05-23 NOTE — Progress Notes (Signed)
LAte entry 05/23/2015 1730 approx.  Report given to Karalee Height at [facility].  She verbalized understanding and was told to call for questions once the patient arrived.  The hemovac was removed by Dr. Aline Brochure prior to discharge and I discussed the changing of the dressing and he stated the type dressing she had could stay up to 7 days.  I voiced to the family and the receiving nurse.  [facility] per the nurses request with  Her packet via w/c with staff and family through the tunnel in stable condition.

## 2015-05-23 NOTE — Progress Notes (Signed)
Physical Therapy Treatment Patient Details Name: Katherine Joseph MRN: [id number] DOB: 04/22/1935 Today's Date: 05/23/2015    History of Present Illness HPI: Katherine Joseph, 80 y.o. female, has a history of pain and functional disability in the left knee due to arthritis and has failed non-surgical conservative treatments for greater than 12 weeks to includeNSAID's and/or analgesics, corticosteriod injections, use of assistive devices and activity modification. Onset of symptoms was gradual, starting >10 years ago with gradually worsening course since that time. The patient noted no past surgery on the left knee(s). Patient currently rates pain in the left knee(s) at 8 out of 10 with activity. Patient has night pain, worsening of pain with activity and weight bearing, pain that interferes with activities of daily living, pain with passive range of motion, crepitus and joint swelling. Patient has evidence of subchondral cysts, subchondral sclerosis, periarticular osteophytes and joint space narrowing by imaging studies There is no active infection.    PT Comments    Pt was found to be alert and cooperative, moderate edema in the left knee.  Knee ROM=12-95 degrees AA left knee.  She was instructed in transfers and gait with a walker, walked 50' with stable gait.  She tolerated exercise well with stretching into both flexion and extension.  Follow Up Recommendations  SNF     Equipment Recommendations  None recommended by PT    Recommendations for Other Services  none     Precautions / Restrictions Precautions Precautions: Fall;Knee Restrictions Weight Bearing Restrictions: No Other Position/Activity Restrictions: WBAT left    Mobility  Bed Mobility Overal bed mobility: Modified Independent Bed Mobility: Supine to Sit     Supine to sit: Modified independent (Device/Increase time)        Transfers Overall transfer level: Modified independent Equipment used: Rolling walker (2  wheeled) Transfers: Sit to/from Stand Sit to Stand: Modified independent (Device/Increase time)            Ambulation/Gait Ambulation/Gait assistance: Supervision Ambulation Distance (Feet): 50 Feet Assistive device: Rolling walker (2 wheeled) Gait Pattern/deviations: Decreased stance time - left Gait velocity: appropriate for situation Gait velocity interpretation: <1.8 ft/sec, indicative of risk for recurrent falls     Stairs            Wheelchair Mobility    Modified Rankin (Stroke Patients Only)       Balance Overall balance assessment: No apparent balance deficits (not formally assessed)                                  Cognition Arousal/Alertness: Awake/alert Behavior During Therapy: WFL for tasks assessed/performed Overall Cognitive Status: Within Functional Limits for tasks assessed                      Exercises Total Joint Exercises Ankle Circles/Pumps: AROM;Both;10 reps;Supine Quad Sets: AROM;Both;10 reps;Supine Gluteal Sets: AROM;Both;10 reps;Supine Short Arc Quad: AAROM;Left;10 reps;Supine Heel Slides: AAROM;Left;10 reps;Supine Knee Flexion: AAROM;Left;10 reps;Seated Goniometric ROM: 12-95 degrees AA left knee    General Comments        Pertinent Vitals/Pain Pain Assessment: No/denies pain (just soreness)    Home Living                      Prior Function            PT Goals (current goals can now be found in the care plan section) Progress towards PT  goals: Progressing toward goals    Frequency  BID    PT Plan Current plan remains appropriate    Co-evaluation             End of Session Equipment Utilized During Treatment: Gait belt Activity Tolerance: Patient tolerated treatment well;No increased pain Patient left: in chair;with call bell/phone within reach;with family/visitor present     Time: 0900 (earlier times recorded were in error)-0947 PT Time Calculation (min) (ACUTE ONLY):  47 min  Charges:  $Gait Training: 8-22 mins $Therapeutic Exercise: 8-22 mins $Therapeutic Activity: 8-22 mins                    G CodesSable Feil   PT  05/23/2015, 11:08 AM [phone]

## 2015-05-23 NOTE — Discharge Summary (Signed)
Physician Discharge Summary  Patient ID: Katherine Joseph MRN: [id number] DOB/AGE: Jun 11, 1935 80 y.o.  Admit date: 05/21/2015 Discharge date: 05/23/2015  Admission Diagnoses: Severe arthritis left knee   Discharge Diagnoses: Same   Active Problems:   Total knee replacement status   Arthritis of left knee   Arthritis of knee, degenerative   Discharged Condition: stable  Hospital Course:  August 31 underwent uncomplicated left total knee with [employer] fixed bearing posterior stabilized total knee tolerated that well under spinal anesthesia complications  September 1 started physical therapy ambulated greater than 25 feet the flexion greater than 90 progressed normally  September 2 the drain was removed. The patient was in good condition. Pain was well controlled.    Discharge Exam: Blood pressure 122/64, pulse 89, temperature 98.2 F (36.8 C), temperature source Oral, resp. rate 18, height 5\' 9"  (1.753 m), weight 199 lb 1.6 oz (90.311 kg), SpO2 96 %. She was awake alert oriented. She had some mild confusion early in the morning which resolved. There is no calf swelling ankle swelling no edema negative Homans sign.  Disposition:   Discharge Instructions    Call MD / Call 911    Complete by:  As directed   If you experience chest pain or shortness of breath, CALL 911 and be transported to the hospital emergency room.  If you develope a fever above 101 F, pus (white drainage) or increased drainage or redness at the wound, or calf pain, call your surgeon's office.     Change dressing    Complete by:  As directed   Change dressing on POD 7, then change the dressing daily with sterile 4 x 4 inch gauze dressing and apply TED hose.  You may clean the incision with alcohol prior to redressing.     Constipation Prevention    Complete by:  As directed   Drink plenty of fluids.  Prune juice may be helpful.  You may use a stool softener, such as Colace (over the counter) 100 mg twice a  day.  Use MiraLax (over the counter) for constipation as needed.     Diet - low sodium heart healthy    Complete by:  As directed      Discharge instructions    Complete by:  As directed   WBAT   CPM 0-75 INCR 10 PER DAY X 14 DAYS USE 6 HRS PER DAY  USE ASPIRIN FOR DVT PREVENTION     Do not put a pillow under the knee. Place it under the heel.    Complete by:  As directed      Increase activity slowly as tolerated    Complete by:  As directed      TED hose    Complete by:  As directed   Use stockings (TED hose) for 2 weeks on BOTH leg(s).  You may remove them at night for sleeping.            Medication List    STOP taking these medications        aspirin 81 MG tablet      TAKE these medications        atorvastatin 20 MG tablet  Commonly known as:  LIPITOR  Take 20 mg by mouth daily.     divalproex 250 MG 24 hr tablet  Commonly known as:  DEPAKOTE ER  Take 1,000 mg by mouth at bedtime.     gabapentin 300 MG capsule  Commonly known as:  NEURONTIN  Take 300 mg by mouth 3 (three) times daily.     HYDROcodone-acetaminophen 7.5-325 MG per tablet  Commonly known as:  NORCO  Take 1 tablet by mouth every 6 (six) hours as needed for moderate pain.     KLOR-CON M20 20 MEQ tablet  Generic drug:  potassium chloride SA  Take 20 mEq by mouth daily.     lisinopril-hydrochlorothiazide 20-12.5 MG per tablet  Commonly known as:  PRINZIDE,ZESTORETIC  Take 1 tablet by mouth daily.     LORazepam 1 MG tablet  Commonly known as:  ATIVAN  Take 1 mg by mouth every 8 (eight) hours.     metoprolol tartrate 25 MG tablet  Commonly known as:  LOPRESSOR  Take 25 mg by mouth 2 (two) times daily. 1/2 TAB TWICE DAILY     omeprazole 20 MG capsule  Commonly known as:  PRILOSEC  Take 1 capsule (20 mg total) by mouth daily.     polyethylene glycol packet  Commonly known as:  MIRALAX / GLYCOLAX  Take 17 g by mouth daily. Takes every am or every other day     PROBIOTIC FORMULA Caps   Take by mouth. ALIGN 1 PO DAILY     torsemide 100 MG tablet  Commonly known as:  DEMADEX  Take 100 mg by mouth daily. 1/2 TABLET DAILY           Follow-up Information    Follow up with Arther Abbott, MD.   Specialties:  Orthopedic Surgery, Radiology   Contact information:   [address] [place] [address] [phone]       Signed: Arther Abbott 05/23/2015, 10:36 AM

## 2015-05-23 NOTE — Care Management Important Message (Signed)
Important Message  Patient Details  Name: NARGIS ABRAMS MRN: [id number] Date of Birth: 1935/05/17   Medicare Important Message Given:  N/A - LOS <3 / Initial given by admissions    Sherald Barge, RN 05/23/2015, 11:50 AM

## 2015-05-25 LAB — TYPE AND SCREEN
ABO/RH(D): O POS
Antibody Screen: NEGATIVE
Unit division: 0
Unit division: 0

## 2015-05-25 LAB — PREPARE RBC (CROSSMATCH)

## 2015-05-28 ENCOUNTER — Non-Acute Institutional Stay (SKILLED_NURSING_FACILITY): Payer: Medicare HMO | Admitting: Internal Medicine

## 2015-05-28 DIAGNOSIS — T50905S Adverse effect of unspecified drugs, medicaments and biological substances, sequela: Secondary | ICD-10-CM | POA: Diagnosis not present

## 2015-05-28 DIAGNOSIS — Z96652 Presence of left artificial knee joint: Secondary | ICD-10-CM | POA: Diagnosis not present

## 2015-05-28 DIAGNOSIS — I10 Essential (primary) hypertension: Secondary | ICD-10-CM

## 2015-05-28 DIAGNOSIS — E785 Hyperlipidemia, unspecified: Secondary | ICD-10-CM | POA: Diagnosis not present

## 2015-05-28 DIAGNOSIS — R609 Edema, unspecified: Secondary | ICD-10-CM | POA: Diagnosis not present

## 2015-05-29 ENCOUNTER — Other Ambulatory Visit: Payer: Self-pay | Admitting: *Deleted

## 2015-05-29 MED ORDER — LORAZEPAM 1 MG PO TABS: ORAL_TABLET | ORAL | Status: DC

## 2015-05-29 NOTE — Telephone Encounter (Signed)
[facility] 

## 2015-06-02 ENCOUNTER — Non-Acute Institutional Stay (SKILLED_NURSING_FACILITY): Payer: Medicare HMO | Admitting: Internal Medicine

## 2015-06-02 ENCOUNTER — Encounter (HOSPITAL_COMMUNITY)
Admission: AD | Admit: 2015-06-02 | Discharge: 2015-06-02 | Disposition: A | Discharge status: Home / Self Care | Payer: Medicare HMO | Source: Skilled Nursing Facility | Attending: Internal Medicine | Admitting: Internal Medicine

## 2015-06-02 ENCOUNTER — Ambulatory Visit: Payer: Medicare HMO | Admitting: Orthopedic Surgery

## 2015-06-02 DIAGNOSIS — E876 Hypokalemia: Secondary | ICD-10-CM | POA: Diagnosis not present

## 2015-06-02 DIAGNOSIS — Z96652 Presence of left artificial knee joint: Secondary | ICD-10-CM | POA: Diagnosis not present

## 2015-06-02 DIAGNOSIS — R609 Edema, unspecified: Secondary | ICD-10-CM

## 2015-06-02 LAB — BASIC METABOLIC PANEL
Anion gap: 8 (ref 5–15)
BUN: 16 mg/dL (ref 6–20)
CALCIUM: 8.4 mg/dL — AB (ref 8.9–10.3)
CO2: 32 mmol/L (ref 22–32)
CREATININE: 0.88 mg/dL (ref 0.44–1.00)
Chloride: 94 mmol/L — ABNORMAL LOW (ref 101–111)
Glucose, Bld: 100 mg/dL — ABNORMAL HIGH (ref 65–99)
Potassium: 3.4 mmol/L — ABNORMAL LOW (ref 3.5–5.1)
SODIUM: 134 mmol/L — AB (ref 135–145)

## 2015-06-02 LAB — CBC WITH DIFFERENTIAL/PLATELET
BASOS PCT: 0 % (ref 0–1)
Basophils Absolute: 0 10*3/uL (ref 0.0–0.1)
EOS ABS: 0.4 10*3/uL (ref 0.0–0.7)
Eosinophils Relative: 3 % (ref 0–5)
HCT: 26.3 % — ABNORMAL LOW (ref 36.0–46.0)
HEMOGLOBIN: 8.6 g/dL — AB (ref 12.0–15.0)
Lymphocytes Relative: 33 % (ref 12–46)
Lymphs Abs: 3.8 10*3/uL (ref 0.7–4.0)
MCH: 24.8 pg — ABNORMAL LOW (ref 26.0–34.0)
MCHC: 32.7 g/dL (ref 30.0–36.0)
MCV: 75.8 fL — ABNORMAL LOW (ref 78.0–100.0)
Monocytes Absolute: 1 10*3/uL (ref 0.1–1.0)
Monocytes Relative: 9 % (ref 3–12)
NEUTROS PCT: 55 % (ref 43–77)
Neutro Abs: 6.5 10*3/uL (ref 1.7–7.7)
PLATELETS: 422 10*3/uL — AB (ref 150–400)
RBC: 3.47 MIL/uL — AB (ref 3.87–5.11)
RDW: 14.7 % (ref 11.5–15.5)
WBC: 11.8 10*3/uL — AB (ref 4.0–10.5)

## 2015-06-02 LAB — VALPROIC ACID LEVEL: VALPROIC ACID LVL: 62 ug/mL (ref 50.0–100.0)

## 2015-06-02 NOTE — Progress Notes (Addendum)
Patient ID: Katherine Joseph, female   DOB: Feb 10, 1935, 80 y.o.   MRN: [id number]                HISTORY & PHYSICAL  DATE:  05/28/2015          FACILITY: [facility]                         LEVEL OF CARE:   SNF   CHIEF COMPLAINT:  Admission to the facility, post stay at [facility], 05/21/2015 through 05/23/2015.     HISTORY OF PRESENT ILLNESS:  This is a reasonably independent, 80 year-old woman who lives near [place], [place].    She was admitted for an elective left total knee replacement.  Her surgery seems to have gone uneventfully.    She is also listed as having osteoarthritis of the right knee.  However, she appears to be ambulating.    States her shoulders are doing satisfactorily well.    PAST MEDICAL HISTORY/PROBLEM LIST:             Gastroesophageal reflux disease.     Hyperlipidemia.    Hypertension.    Irritable bowel syndrome.    Internal hemorrhoids.    PAST SURGICAL HISTORY:          Right foot surgery.      EGD and colonoscopy.    Left total knee arthroplasty on 05/21/2015.      CURRENT MEDICATIONS:  Medication list is reviewed.              Lipitor 20 q.d.       Depakote ER 1000 mg at bedtime (I see no good explanation for this).    Neurontin 300 three times a day.    Norco 7.5/3.5 q.6 hours.     Klor-Con 20 q.d.      Lisinopril/hydrochlorothiazide 20/12.5, 1 tablet daily.     Ativan 1 mg every 8 hours.    Lopressor 25 mg,  tab/12.5 mg b.i.d.     Prilosec 20 q.d.      MiraLAX 17 g daily.     Probiotic.    Demadex 100 mg,  tablet daily.    SOCIAL HISTORY:                   HOUSING:  The patient states she lives on her own in a double-wide trailer.  She has two steps to get in.    FUNCTIONAL STATUS:  She has both a cane and a walker at home.  I am not really clear if she was using either one of these.   TOBACCO USE:  She is a never-smoker.   ALCOHOL:  No alcohol use.    FAMILY HISTORY:          MOTHER:   Alzheimer's disease.    FATHER:  Cancer of the stomach.    REVIEW OF SYSTEMS:       HEENT:          CHEST/RESPIRATORY:  She does not complain of shortness of breath.     CARDIAC:  No chest pain.    GI:  No change in bowel habits.  No abdominal pain.   GU:  No dysuria.   No incontinence.    MUSCULOSKELETAL:  States she has some discomfort in the right knee.  However, she is managing quite well now.  Complains of pain in her neck on occasion that radiates into both her  shoulders.   I note that she was worked up with an MRI of her C-spine in 2008.  This showed foraminal stenosis at several levels, probably explaining these symptoms.   NEUROLOGICAL:  No complaints of focal weakness or numbness.   ENDOCRINE:  She has not been told she is a diabetic.    PHYSICAL EXAMINATION:   GENERAL APPEARANCE:  The patient is awake, alert, pleasant.   HEENT:   MOUTH/THROAT:  Her tongue is somewhat coated.  There is no oral thrush or lesions seen.   CHEST/RESPIRATORY:  Clear air entry bilaterally.     CARDIOVASCULAR:   CARDIAC:  Heart sounds are normal.  There are no murmurs.   GASTROINTESTINAL:   ABDOMEN:  Soft.   No masses.  Nontender.   MUSCULOSKELETAL:    EXTREMITIES:   LEFT LOWER EXTREMITY:  Left knee:  Incision looks stable.  Staples still in.  No evidence of infection.   RIGHT LOWER EXTREMITY:  Right knee:  Osteoarthritic changes, but is stable.   NEUROLOGICAL:   MOTOR:  She has no pronator drift.   SENSATION/STRENGTH:  Good antigravity strength bilaterally.   PSYCHIATRIC:   MENTAL STATUS:    I detect no abnormalities here.   The patient denies any psychiatric history.    ASSESSMENT/PLAN:             Status post left total knee replacement.  She is not on anything for DVT prophylaxis at this point.  However, she is already up mobilizing and ambulating.    Hypertension.  I will need to monitor this while she is here.    On Depakote and Neurontin for reasons that are not really clear.  She  carries a diagnosis of "fluttering muscles" from September 2009.  She vehemently denies seizures.  She had an MRI of the brain in 2012 which showed no abnormality, a chronic stable left para-midline arachnoid cyst. I am not really sure what fluttering muscles means.  Other than that, she tells me she has been on Depakote and Neurontin for years.    Lower extremity edema.   Apparently was put on Demadex 50-100 mg a day to control her swelling in her legs, apparently before this knee surgery was done.   This has certainly worked as she has absolutely no edema.  However, this is a large dose of a very potent loop diuretic.  I think this probably should be reduced.  She left the hospital with a normal BUN and creatinine on 05/22/2015 at 12 and 0.89, respectively.  Her potassium was 4.3.  Sodium slightly reduced at 133, however.    Leukocytosis.  Again, left the hospital on 05/23/2015 with a white count  of 15.9.  There was no differential.  Her hemoglobin was 8.9.  This may have represented the stress of surgery.  I do not see any reason for the leukocytosis based on bedside exam.    Hyperlipidemia.  On Lipitor.  I see no reason why we need to follow this here.    Gastroesophageal reflux disease.  On Prilosec.   She seems asymptomatic.      I am going to reduce her Demadex to a manageable dose.    Repeat her lab work next week.    With regards to the Depakote and Neurontin, I will check a valproic acid level on her.  I wonder whether these medications really need to continue, whether these can be therapeutically withdrawn.  I would say both of these things are unclear.

## 2015-06-03 ENCOUNTER — Other Ambulatory Visit: Payer: Self-pay | Admitting: *Deleted

## 2015-06-03 MED ORDER — HYDROCODONE-ACETAMINOPHEN 7.5-325 MG PO TABS
1.0000 | ORAL_TABLET | ORAL | Status: DC | PRN
Start: 2015-06-03 — End: 2016-10-04

## 2015-06-03 NOTE — Telephone Encounter (Signed)
[facility] 

## 2015-06-07 NOTE — Progress Notes (Addendum)
Patient ID: Katherine Joseph, female   DOB: Oct 26, 1934, 80 y.o.   MRN: [id number]                PROGRESS NOTE  DATE:  06/02/2015         FACILITY: [facility]                    LEVEL OF CARE:   SNF   Acute Visit                CHIEF COMPLAINT:  Follow up medical issues including diuretics, edema, etc.     HISTORY OF PRESENT ILLNESS:  This is a patient whom I admitted last week after an elective left total knee replacement.    She came to [place] on Demadex 50 mg a day.   I am led to believe that the dose may have been higher than this.  She is also on Lisinopril/hydrochlorothiazide 20/12.5 q.d.   I was reluctant to give a patient without a clear diagnosis a dose of Demadex.  I reduced her down to 20.    There was also some question about the Depakote ER and Neurontin that she was on, raised by the family.  I had already wondered about this since there is not any good explanation in the system, nor is the patient really aware of this.  Nevertheless, there was concern that I may not have complete information and I really was reluctant to change this, at least without a taper.   Past Medical History  Diagnosis Date  . GERD (gastroesophageal reflux disease)   . Hyperlipidemia   . Hypertension   . IBS (irritable bowel syndrome)   . Fluttering muscles SEP 2009    MBS/BASW NL  . Hemorrhoids, internal OCT 2011   Past Surgical History  Procedure Laterality Date  . Esophagogastroduodenoscopy  2006    W/DILATION W/RMR  . Right foot surgery    . Colonoscopy  2004    RMR/NUR INFLAMMATORY POLYP, POST POLYPECTOMY BLEED  . Colonoscopy  oct 2011 [facility] nvd-WEIGHT LOSS    NL TI, Edgar/DC TICS  . Knee arthroscopy    . Total knee arthroplasty Left 05/21/2015    Procedure: TOTAL KNEE ARTHROPLASTY;  Surgeon: Carole Civil, MD;  Location: [facility];  Service: Orthopedics;  Laterality: Left;    REVIEW OF SYSTEMS:    CHEST/RESPIRATORY:  No shortness of breath.   CARDIAC:  No chest pain.     EDEMA/VARICOSITIES:  Extremities:  She has not noted any edema.    GI:  No change in bowel habits.  No abdominal pain.    GU:  No dysuria.    MUSCULOSKELETAL:  States her left knee is coming along nicely.       PHYSICAL EXAMINATION:   GENERAL APPEARANCE:  The patient is not in any distress.   CHEST/RESPIRATORY:  Clear air entry bilaterally.    CARDIOVASCULAR:   CARDIAC:  Heart sounds are normal.  There is a soft midsystolic murmur that sounds benign.       GASTROINTESTINAL:   ABDOMEN:  No masses.  No tenderness.    CIRCULATION:   EDEMA/VARICOSITIES:  Extremities:  There is some mild edema present, but no evidence of a DVT.    ASSESSMENT/PLAN:                   Status post left total knee replacement.   This is stable on a reduced dose of torsemide at 20  mg qd.   Edema.  On a very large dose of diuretics.  Her BUN today is 16, creatinine 0.88, potassium at 3.4 which is not surprising.  I am going to change her to pure Lisinopril.     On seizure medication, Neurontin and Depakote, for reasons that are not clear.  She carries a diagnosis of "fluttering muscles" from September 2009.  I would not be opposed to tapering this if she was going to be a long-term patient here.     Mild leukocytosis.  Her white count  is 11.8.  This would appear to be an improvement.  No source of infection

## 2015-06-09 ENCOUNTER — Encounter (HOSPITAL_COMMUNITY)
Admission: RE | Admit: 2015-06-09 | Discharge: 2015-06-09 | Disposition: A | Discharge status: Home / Self Care | Payer: Medicare HMO | Source: Skilled Nursing Facility | Attending: Internal Medicine | Admitting: Internal Medicine

## 2015-06-09 LAB — CBC
HEMATOCRIT: 30 % — AB (ref 36.0–46.0)
HEMOGLOBIN: 9.3 g/dL — AB (ref 12.0–15.0)
MCH: 23.7 pg — AB (ref 26.0–34.0)
MCHC: 31 g/dL (ref 30.0–36.0)
MCV: 76.3 fL — AB (ref 78.0–100.0)
Platelets: 441 10*3/uL — ABNORMAL HIGH (ref 150–400)
RBC: 3.93 MIL/uL (ref 3.87–5.11)
RDW: 15.1 % (ref 11.5–15.5)
WBC: 10.9 10*3/uL — ABNORMAL HIGH (ref 4.0–10.5)

## 2015-06-09 LAB — BASIC METABOLIC PANEL
ANION GAP: 6 (ref 5–15)
BUN: 14 mg/dL (ref 6–20)
CALCIUM: 8.3 mg/dL — AB (ref 8.9–10.3)
CO2: 27 mmol/L (ref 22–32)
Chloride: 101 mmol/L (ref 101–111)
Creatinine, Ser: 1.03 mg/dL — ABNORMAL HIGH (ref 0.44–1.00)
GFR calc Af Amer: 59 mL/min — ABNORMAL LOW (ref >60)
GFR calc non Af Amer: 51 mL/min — ABNORMAL LOW (ref >60)
GLUCOSE: 101 mg/dL — AB (ref 65–99)
Potassium: 3.8 mmol/L (ref 3.5–5.1)
Sodium: 134 mmol/L — ABNORMAL LOW (ref 135–145)

## 2015-06-10 ENCOUNTER — Encounter: Payer: Self-pay | Admitting: Internal Medicine

## 2015-06-10 ENCOUNTER — Non-Acute Institutional Stay (SKILLED_NURSING_FACILITY): Payer: Medicare HMO | Admitting: Internal Medicine

## 2015-06-10 DIAGNOSIS — I10 Essential (primary) hypertension: Secondary | ICD-10-CM | POA: Diagnosis not present

## 2015-06-10 DIAGNOSIS — R609 Edema, unspecified: Secondary | ICD-10-CM

## 2015-06-10 DIAGNOSIS — Z96652 Presence of left artificial knee joint: Secondary | ICD-10-CM | POA: Diagnosis not present

## 2015-06-10 DIAGNOSIS — D72829 Elevated white blood cell count, unspecified: Secondary | ICD-10-CM

## 2015-06-10 NOTE — Progress Notes (Signed)
Patient ID: Katherine Joseph, female   DOB: 01-Mar-1935, 79 y.o.   MRN: [id number]          This is a discharge note  DATE:  06/10/2015          FACILITY: [facility]                         LEVEL OF CARE:   SNF   CHIEF COMPLAINT:  Discharge note.     HISTORY OF PRESENT ILLNESS:  This is a reasonably independent, 80 year-old woman who lives near [place], [place].    She was admitted for an elective left total knee replacement.  Her surgery seems to have gone uneventfully.    She is also listed as having osteoarthritis of the right knee.  However, she appears to be ambulating.    In the hospital apparently she was diuresed aggressively with Demadex secondary to edema but this has largely resolved Dr. Dellia Nims did decrease her Demadex to 20 mg a day and she appears to have tolerated this well weights have been stable.  Patient also had postop anemia but this appears to be rising slowly with a hemoglobin of 9.3 as of yesterday.  She also had leukocytosis this was thought I believe to be reactive to the surgery this is trending down and now almost normalized at 10.9 on lab done yesterday  She is now on lisinopril she had been on lisinopril hydrochlorothiazide combo initially but secondary to concerns of over diuresis Dr. Dellia Nims did decrease the hydrochlorothiazide nonetheless her blood pressure appears to be quite stable recent blood pressures 130/73-104/59-104/60.  At this point her pain appears to be controlled she is receiving Norco 7. 5/325 mg  every 6 hours when necessary she is also on Neurontin 300 mg 3 times a day and Robaxin 500 mg every 6 hours when necessary.  Regards anticoagulation status post surgery she is on aspirin 325 mg twice a day--at some point suspect we can reduce this to 81 mg a day which she was on before surgery will defer to orthopedics on this  She has done well she is ambulating with a walker now she will be home alone but has strong family  support and they live nearby-they are very supportive .    PAST MEDICAL HISTORY/PROBLEM LIST:             Gastroesophageal reflux disease.     Hyperlipidemia.    Hypertension.    Irritable bowel syndrome.    Internal hemorrhoids.    PAST SURGICAL HISTORY:          Right foot surgery.      EGD and colonoscopy.    Left total knee arthroplasty on 05/21/2015.      CURRENT MEDICATIONS:  Medication list is reviewed.              Lipitor 20 q.d.       Depakote ER 1000 mg at bedtime (I see no good explanation for this).    Neurontin 300 three times a day.    Norco 7.5/3.5 q.6 hours.     Klor-Con 20 q.d.      Lisinopril/ 20 mg1 tablet daily.     Ativan 1 mg QAM    Lopressor 25 mg,  tab/12.5 mg b.i.d.     Prilosec 20 q.d.      MiraLAX 17 g daily.     Probiotic.    Demadex 20 mg QD  SOCIAL HISTORY:                   HOUSING:  The patient states she lives on her own in a double-wide trailer.  She has two steps to get in.    FUNCTIONAL STATUS:  She has both a cane and a walker at home. .   TOBACCO USE:  She is a never-smoker.   ALCOHOL:  No alcohol use.    FAMILY HISTORY:          MOTHER:  Alzheimer's disease.    FATHER:  Cancer of the stomach.    REVIEW OF SYSTEMS Gen. no complaints of fever chills feels well.  Skin surgical site left knee appears unremarkable with no sign of infection :       HEENT:          CHEST/RESPIRATORY:  She does not complain of shortness of breath.     CARDIAC:  No chest pain.    GI:  No change in bowel habits.  No abdominal pain.   GU:  No dysuria.   No incontinence.    MUSCULOSKELETAL:  States she has some discomfort in the right knee.  However, she is managing quite well now.  Complains of pain in her neck on occasion that radiates into both her shoulders.    she was worked up with an MRI of her C-spine in 2008.  This showed foraminal stenosis at several levels, probably explaining these symptoms.   NEUROLOGICAL:  No  complaints of focal weakness or numbness.   Psych does not complain of anxiety or depression.    PHYSICAL EXAMINATION Temperature 98.3 pulse 80 respirations 20 blood pressure 130/73 weight is stable at 190.8:   GENERAL APPEARANCE:  The patient is awake, alert, pleasant.  \Her skin is warm and dry surgical site left knee has crusting appears to be healing unremarkably and do not see any concerning erythema some baseline edema minimal warmth Steri-Strips are in place aover parts of the incision  HEENT:   MOUTH/THROAT:  Pharynx clear mucous membranes moist .   CHEST/RESPIRATORY:  Clear air entry bilaterally.     CARDIOVASCULAR:   CARDIAC:  Heart sounds are normal.  There are no murmurs. She has some edema mostly tof her feet bilaterally pedal pulses are palpable-per nursing edema progresses during the day when her legs are in a dependent position and apparently she also has her legs dangling over the bed at times with some dependency-   GASTROINTESTINAL:   ABDOMEN:  Soft.   No masses.  Nontender.   MUSCULOSKELETAL:    EXTREMITIES:   LEFT LOWER EXTREMITY:  Left knee:  Incision looks stable. She will site healing unremarkably.  No evidence of infection.   RIGHT LOWER EXTREMITY:  Right knee:  Osteoarthritic changes, but is stable.    She is ambulating with a walker now. Neurologic grossly intact and there are no lateralizing findings her speech is clear   PSYCHIATRIC:   MENTAL STATUS:    I detect no abnormalities here.   The patient denies any psychiatric history  06/09/2015.  Sodium 134 potassium 3.8 BUN 14 creatinine 1.03.  WBC 10.9 hemoglobin 9.3 platelets 441.  06/02/2015.  Depakote level LXII.    ASSESSMENT/PLAN:             Status post left total knee replacement.--She has rehabbed well she will have orthopedic follow-up next week-she is receiving Norco as well as Robaxin when necessary for pain this appears to be effective.  She is on aspirin 325 mg twice a day for  prophylaxis I suspect this can be reduced to her pre-surgery dose of 81 mg a day at some point will defer to orthopedics        Hypertension.  This appears stable on lisinopril 20 mg a day  On Depakote and Neurontin for reasons that are not really clear.  She carries a diagnosis of "fluttering muscles" from September 2009.  She  denies seizures.  She had an MRI of the brain in 2012 which showed no abnormality, a chronic stable left para-midline arachnoid cyst. I am not really sure what fluttering muscles means.     she has been on Depakote and Neurontin for years will defer to primary care provider to follow up on this  .    Lower extremity edema.   Apparently was put on Demadex 50-100 mg a day to control her swelling in her legs, apparently before this knee surgery was done.   She has minimal edema--most prominent in her feet bilaterally- currently on Demadex 20 mg a day-renal function appears to be stable with a BUN 14 creatinine 1.03 on lab done on 06/09/2015.    Leukocytosis.  Again, left the hospital on 05/23/2015 with a white count  of 15.9.  There was no differential.  Her hemoglobin was 8.9.  This may have represented the stress of surgery.  White count is now almost normalized at 10.9 hemoglobin has improved up to 9.3     Hyperlipidemia.  On Lipitor. This was not followed aggressively here secondary to her short stay    Gastroesophageal reflux disease.  On Prilosec.   She seems asymptomatic    Again patient will be going home she does live alone with strong family support she has follow-up with orthopedics scheduled for next week she appears to be quite stable-again will defer to orthopedics about changing her anticoagulation    [id number]-of note greater than 30 minutes spent on this discharge summary-greater than 50% of time spent coordinating plan of care.      I.

## 2015-06-16 ENCOUNTER — Ambulatory Visit (INDEPENDENT_AMBULATORY_CARE_PROVIDER_SITE_OTHER): Payer: Medicare HMO | Admitting: Orthopedic Surgery

## 2015-06-16 ENCOUNTER — Encounter: Payer: Self-pay | Admitting: Orthopedic Surgery

## 2015-06-16 VITALS — BP 113/62 | Ht 69.0 in | Wt 199.1 lb

## 2015-06-16 DIAGNOSIS — Z471 Aftercare following joint replacement surgery: Secondary | ICD-10-CM

## 2015-06-16 DIAGNOSIS — Z96652 Presence of left artificial knee joint: Secondary | ICD-10-CM

## 2015-06-16 NOTE — Progress Notes (Signed)
Patient ID: Katherine Joseph, female   DOB: 03-24-1935, 80 y.o.   MRN: [id number]  Follow up visit  Chief Complaint  Patient presents with  . Follow-up    post op 2, Left TKA, DOS 05/21/15    BP 113/62 mmHg  Ht 5\' 9"  (1.753 m)  Wt 199 lb 1.6 oz (90.311 kg)  BMI 29.39 kg/m2  No diagnosis found.  Four-week status post left total knee she has a slight flexion contracture she is discharged home and is now going to outpatient therapy  She is on hydrocodone 7.5 mg  Her only problem release extension or flexion is well past 90  Recommend continue therapy follow-up in a month

## 2015-06-17 ENCOUNTER — Ambulatory Visit (HOSPITAL_COMMUNITY): Payer: Medicare HMO | Attending: Internal Medicine

## 2015-06-17 DIAGNOSIS — R262 Difficulty in walking, not elsewhere classified: Secondary | ICD-10-CM

## 2015-06-17 DIAGNOSIS — Z96652 Presence of left artificial knee joint: Secondary | ICD-10-CM | POA: Diagnosis not present

## 2015-06-17 DIAGNOSIS — M25662 Stiffness of left knee, not elsewhere classified: Secondary | ICD-10-CM | POA: Insufficient documentation

## 2015-06-17 DIAGNOSIS — M25562 Pain in left knee: Secondary | ICD-10-CM | POA: Diagnosis present

## 2015-06-17 DIAGNOSIS — Z7409 Other reduced mobility: Secondary | ICD-10-CM | POA: Insufficient documentation

## 2015-06-17 NOTE — Therapy (Signed)
[facility] [address] [place], [place], [address] Phone: [phone]   Fax:  [phone]  Physical Therapy Evaluation  Patient Details  Name: Katherine Joseph MRN: [id number] Date of Birth: 10-12-1934 Referring Provider:  Ricard Dillon, MD  Encounter Date: 06/17/2015      PT End of Session - 06/17/15 1646    Visit Number 1   Number of Visits 24   Date for PT Re-Evaluation 08/18/15   Authorization Type Aetna Medicare/ Aetna HMO/PPO    Authorization Time Period 06/17/15-08/18/15   Authorization - Visit Number 1   PT Start Time 4008   PT Stop Time 1427   PT Time Calculation (min) 40 min   Activity Tolerance Patient tolerated treatment well;Patient limited by pain;Patient limited by fatigue   Behavior During Therapy [facility] for tasks assessed/performed      Past Medical History  Diagnosis Date  . GERD (gastroesophageal reflux disease)   . Hyperlipidemia   . Hypertension   . IBS (irritable bowel syndrome)   . Fluttering muscles SEP 2009    MBS/BASW NL  . Hemorrhoids, internal OCT 2011    Past Surgical History  Procedure Laterality Date  . Esophagogastroduodenoscopy  2006    W/DILATION W/RMR  . Right foot surgery    . Colonoscopy  2004    RMR/NUR INFLAMMATORY POLYP, POST POLYPECTOMY BLEED  . Colonoscopy  oct 2011 SLF nvd-WEIGHT LOSS    NL TI, [place]/DC TICS  . Knee arthroscopy    . Total knee arthroplasty Left 05/21/2015    Procedure: TOTAL KNEE ARTHROPLASTY;  Surgeon: Carole Civil, MD;  Location: [facility];  Service: Orthopedics;  Laterality: Left;    There were no vitals filed for this visit.  Visit Diagnosis:  Artificial knee joint present, left - Plan: PT plan of care cert/re-cert  Joint stiffness of knee, left - Plan: PT plan of care cert/re-cert  Knee pain, acute, left - Plan: PT plan of care cert/re-cert  Difficulty in walking involving lower leg joint - Plan: PT plan of care cert/re-cert  Impaired functional  mobility, balance, and endurance - Plan: PT plan of care cert/re-cert      Subjective Assessment - 06/17/15 1352    Subjective Pt underwent L TKA at [facility] on 8/31 and then DC to [facility] for 21 days. Pt has been hoem for about 6 days and is eager to start PT again.    Limitations Standing   How long can you sit comfortably? no problems    How long can you stand comfortably? 20 minutes   How long can you walk comfortably? Has not tried yet.    Patient Stated Goals Get back to walking normally again.    Currently in Pain? Yes   Pain Score 6    Pain Location Knee   Pain Orientation Left   Pain Type Surgical pain            [facility] PT Assessment - 06/17/15 0001    Assessment   Medical Diagnosis L TKA    Onset Date/Surgical Date 05/21/15   Hand Dominance Right   Next MD Visit 10/27 Aline Brochure)   Prior Therapy [facility]   Balance Screen   Has the patient fallen in the past 6 months No   Has the patient had a decrease in activity level because of a fear of falling?  Yes   Is the patient reluctant to leave their home because of a fear of falling?  No   Home Environment   Living Environment Private residence   Living Arrangements Alone   Available Help at Discharge Available PRN/intermittently;Family;Friend(s)   Type of Home Mobile home   Home Access Stairs to enter   Entrance Stairs-Number of Steps 2 in front, 4 in back  railing in back only   Home Layout One level   Prior Function   Level of Independence Independent  Indep in all ADL, IADL, driving, etc.    Observation/Other Assessments   Focus on Therapeutic Outcomes (FOTO)  42   Observation/Other Assessments-Edema    Edema Circumferential;Figure 8   Circumferential Edema   Circumferential - Right 32.5  with socks on   Circumferential - Left  32.5  with socks on   Figure 8 Edema   Figure 8 - Right  70cm  c shoe on   Figure 8 - Left  69.5 cm  c shoe on   Other:   Other/ Comments TUG: 27 seconds c SPC   PROM    Overall PROM  Deficits  L knee Flexion: 18-113 degrees   Palpation   Patella mobility moderalte impaired, moves easily in a very limited range.   Transfers   Five time sit to stand comments  with UE assistance  44 seconds                           PT Education - 06/17/15 1643    Education provided Yes   Education Details Explained to patient typical course of treatment and expected progress/outcomes, as well as necessity for HEP indep.    Person(s) Educated Patient   Methods Explanation   Comprehension Verbal cues required;Tactile cues required;Need further instruction          PT Short Term Goals - 06/17/15 1652    PT SHORT TERM GOAL #1   Title Pt will demonstrate independence in beginning home exercise program by twos weeks after commencement of therapy, to affirm self-efficacy in work at home to making progress toward goals.   PT SHORT TERM GOAL #2   Title Pt will ambulate 239ft with LRAD s exacerbation of symptoms after 4 weeks, to demonstrate improved activity tolerance and improved independence in household ambulation.    PT SHORT TERM GOAL #3   Title Pt will improve gait speed by 50% by the 4th week of therapy to demonstrate improved indep in functional mobility and to decrease risk of falls in the home.            PT Long Term Goals - 06/17/15 1653    PT LONG TERM GOAL #1   Title Pt will demonstrate independence in advanced home exercise program by 1 week prior to discharge, to further self-efficacy in continuation of progress toward goals after discharge from therapy.    PT LONG TERM GOAL #2   Title Pt will ambulate 1070ft c LRAD and s exacerbation of symptoms after 8 weeks, to demonstrate improved activity tolerance and improved independence in household ambulation.    PT LONG TERM GOAL #3   Title After 8 weeks, pt will improve gait speed by 80% to demonstrate improved indep in functional mobility and to decrease risk of falls in the home.                 Plan - 06/17/15 1648    Clinical Impression Statement Pt demonstrating joint stiffness on left knee, with limited ROM, edema in BLE, pain with activity, poor  activity tolerance, and limited balance and functional mobility. Pt will benefit from skilled intervention to address to above impairments and to return to PLOF.    Pt will benefit from skilled therapeutic intervention in order to improve on the following deficits Abnormal gait;Decreased coordination;Decreased range of motion;Difficulty walking;Decreased endurance;Decreased activity tolerance;Decreased balance;Decreased mobility;Decreased strength;Hypomobility;Increased edema;Impaired flexibility;Pain   Rehab Potential Fair   Clinical Impairments Affecting Rehab Potential Pt seems surprised at amount of time and effort that is anticipated to achieve a full recovery.    PT Frequency 3x / week   PT Duration 8 weeks   PT Treatment/Interventions ADLs/Self Care Home Management;Therapeutic exercise;Therapeutic activities;Functional mobility training;Gait training;Stair training;Balance training;Patient/family education;Manual techniques;Scar mobilization   PT Next Visit Plan Review and expand HEP, focusing on stretching ROM in both directions.    PT Home Exercise Plan Quad sets, and seated HS stretching, (printer not working, unable to give pt a copy)    Consulted and Agree with Plan of Care Family member/caregiver;Patient   Family Member Consulted daughter          G-Codes - Jul 10, 2015 1657    Functional Assessment Tool Used FOTO   Functional Limitation Mobility: Walking and moving around   Mobility: Walking and Moving Around Current Status [phone]) At least 40 percent but less than 60 percent impaired, limited or restricted   Mobility: Walking and Moving Around Goal Status [phone]) At least 40 percent but less than 60 percent impaired, limited or restricted       Problem List Patient Active Problem List   Diagnosis Date  Noted  . Total knee replacement status 05/21/2015  . Arthritis of knee, degenerative 05/21/2015  . Arthritis of left knee   . Osteoarthritis of right knee 02/14/2014  . Hip pain, chronic 05/16/2013  . Right knee pain 05/16/2013  . Radicular leg pain 05/16/2013  . Scoliosis of lumbar spine 05/16/2013  . Constipation 06/30/2011  . Colon cancer screening 01/13/2011  . Microcytic anemia 09/03/2010  . WEIGHT LOSS 06/02/2010  . HYPERLIPIDEMIA 07/01/2009  . Essential hypertension 07/01/2009  . GERD 12/04/2008  . IBS 12/04/2008    Kadejah Sandiford C Jul 10, 2015, 4:59 PM 5:00 PM  Etta Grandchild, PT, DPT [employer] License # [id number]      [facility] [address] [place], [place], [address] Phone: [phone]   Fax:  [phone]

## 2015-06-17 NOTE — Patient Instructions (Signed)
Quad Sets   Place rolled up towel or pillow beneath Left knee. Slowly tighten thigh muscles of straight, left leg while counting out loud to _3_. Relax. Repeat 15 times. Do __3__ sessions per day.  http://gt2.exer.us/293   Copyright  VHI. All rights reserved.     Sitting in chair, straighten left knee with foot out in front. Lean forward until a stretch is felt in the back of thigh or behind knee. Hold for 30 seconds, repeat 3 times.  Do this 3 times each day.

## 2015-06-18 ENCOUNTER — Ambulatory Visit (HOSPITAL_COMMUNITY): Payer: Medicare HMO

## 2015-06-19 ENCOUNTER — Ambulatory Visit (HOSPITAL_COMMUNITY): Payer: Medicare HMO

## 2015-06-19 DIAGNOSIS — M25662 Stiffness of left knee, not elsewhere classified: Secondary | ICD-10-CM

## 2015-06-19 DIAGNOSIS — Z96652 Presence of left artificial knee joint: Secondary | ICD-10-CM | POA: Diagnosis not present

## 2015-06-19 DIAGNOSIS — R262 Difficulty in walking, not elsewhere classified: Secondary | ICD-10-CM

## 2015-06-19 DIAGNOSIS — Z7409 Other reduced mobility: Secondary | ICD-10-CM

## 2015-06-19 DIAGNOSIS — M25562 Pain in left knee: Secondary | ICD-10-CM

## 2015-06-19 NOTE — Therapy (Signed)
[facility] [address] [place], [place], [address] Phone: [phone]   Fax:  [phone]  Physical Therapy Treatment  Patient Details  Name: Katherine Joseph MRN: [id number] Date of Birth: 10/28/1934 Referring Provider:  Ricard Dillon, MD  Encounter Date: 06/19/2015      PT End of Session - 06/19/15 1124    Visit Number 2   Number of Visits 24   Date for PT Re-Evaluation 08/18/15   Authorization Type Aetna Medicare/ Aetna HMO/PPO    Authorization Time Period 06/17/15-08/18/15   Authorization - Visit Number 2   Authorization - Number of Visits 10   PT Start Time 1105   PT Stop Time 1156   PT Time Calculation (min) 51 min   Activity Tolerance Patient tolerated treatment well;Patient limited by fatigue   Behavior During Therapy [facility] for tasks assessed/performed      Past Medical History  Diagnosis Date  . GERD (gastroesophageal reflux disease)   . Hyperlipidemia   . Hypertension   . IBS (irritable bowel syndrome)   . Fluttering muscles SEP 2009    MBS/BASW NL  . Hemorrhoids, internal OCT 2011    Past Surgical History  Procedure Laterality Date  . Esophagogastroduodenoscopy  2006    W/DILATION W/RMR  . Right foot surgery    . Colonoscopy  2004    RMR/NUR INFLAMMATORY POLYP, POST POLYPECTOMY BLEED  . Colonoscopy  oct 2011 SLF nvd-WEIGHT LOSS    NL TI, [place]/DC TICS  . Knee arthroscopy    . Total knee arthroplasty Left 05/21/2015    Procedure: TOTAL KNEE ARTHROPLASTY;  Surgeon: Carole Civil, MD;  Location: [facility];  Service: Orthopedics;  Laterality: Left;    There were no vitals filed for this visit.  Visit Diagnosis:  Artificial knee joint present, left  Joint stiffness of knee, left  Knee pain, acute, left  Difficulty in walking involving lower leg joint  Impaired functional mobility, balance, and endurance      Subjective Assessment - 06/19/15 1114    Subjective Pt stated knee feels really tight, has  been applying ice with elevation.  Pain free, took pain pill prior session today   Currently in Pain? No/denies   Pain Relieving Factors Premedicated           [facility] Adult PT Treatment/Exercise - 06/19/15 0001    Exercises   Exercises Knee/Hip   Knee/Hip Exercises: Stretches   Active Hamstring Stretch 3 reps;30 seconds   Active Hamstring Stretch Limitations supine with rope   Knee: Self-Stretch to increase Flexion Left;10 seconds   Knee: Self-Stretch Limitations knee drives for flexion on 12in step 10x 10"   Gastroc Stretch 3 reps;30 seconds   Gastroc Stretch Limitations slant board   Knee/Hip Exercises: Aerobic   Stationary Bike 6' seat 11 for ROM   Knee/Hip Exercises: Seated   Heel Slides 10 reps   Knee/Hip Exercises: Supine   Quad Sets 10 reps   Short Arc Quad Sets 10 reps   Heel Slides 10 reps   Heel Slides Limitations 5" holds           PT Education - 06/19/15 1122    Education provided Yes   Education Details Reviewed goals, emphasis on importance of completeing HEP more consistently, reviewed application of ice for edema and pain control.   Person(s) Educated Patient   Methods Explanation;Demonstration;Tactile cues;Verbal cues   Comprehension Verbal cues required;Tactile cues required;Need further instruction  PT Short Term Goals - 06/17/15 1652    PT SHORT TERM GOAL #1   Title Pt will demonstrate independence in beginning home exercise program by twos weeks after commencement of therapy, to affirm self-efficacy in work at home to making progress toward goals.   PT SHORT TERM GOAL #2   Title Pt will ambulate 260ft with LRAD s exacerbation of symptoms after 4 weeks, to demonstrate improved activity tolerance and improved independence in household ambulation.    PT SHORT TERM GOAL #3   Title Pt will improve gait speed by 50% by the 4th week of therapy to demonstrate improved indep in functional mobility and to decrease risk of falls in the home.             PT Long Term Goals - 06/17/15 1653    PT LONG TERM GOAL #1   Title Pt will demonstrate independence in advanced home exercise program by 1 week prior to discharge, to further self-efficacy in continuation of progress toward goals after discharge from therapy.    PT LONG TERM GOAL #2   Title Pt will ambulate 1029ft c LRAD and s exacerbation of symptoms after 8 weeks, to demonstrate improved activity tolerance and improved independence in household ambulation.    PT LONG TERM GOAL #3   Title After 8 weeks, pt will improve gait speed by 80% to demonstrate improved indep in functional mobility and to decrease risk of falls in the home.                Plan - 06/19/15 1130    Clinical Impression Statement Reviewed goals, emphasized importance of increaseing frequency with HEP and copy of evaluation given to pt.  Session focus on improving Lt knee ROM with quad and hamstring strengthening activities and stretches.  Pt limited by fatigue through session requiring multiple seated rest breaks.  No reports of pain, did report decreased tightness at end of session.  Noted edema present, pt encouraged to apply ice with elevation for pain and edema control.     PT Next Visit Plan Continue current PT POC to improve knee ROM to reduce tightness.        Problem List Patient Active Problem List   Diagnosis Date Noted  . Total knee replacement status 05/21/2015  . Arthritis of knee, degenerative 05/21/2015  . Arthritis of left knee   . Osteoarthritis of right knee 02/14/2014  . Hip pain, chronic 05/16/2013  . Right knee pain 05/16/2013  . Radicular leg pain 05/16/2013  . Scoliosis of lumbar spine 05/16/2013  . Constipation 06/30/2011  . Colon cancer screening 01/13/2011  . Microcytic anemia 09/03/2010  . WEIGHT LOSS 06/02/2010  . HYPERLIPIDEMIA 07/01/2009  . Essential hypertension 07/01/2009  . GERD 12/04/2008  . IBS 12/04/2008   Ihor Austin, LPTA;  [place]  Aldona Lento 06/19/2015, 11:57 AM  [place], [place], [address] Phone: [phone]   Fax:  [phone]

## 2015-06-23 ENCOUNTER — Other Ambulatory Visit: Payer: Self-pay | Admitting: Internal Medicine

## 2015-06-23 ENCOUNTER — Ambulatory Visit (HOSPITAL_COMMUNITY): Payer: Medicare HMO | Attending: Orthopedic Surgery | Admitting: Physical Therapy

## 2015-06-23 DIAGNOSIS — M25562 Pain in left knee: Secondary | ICD-10-CM | POA: Diagnosis present

## 2015-06-23 DIAGNOSIS — Z96652 Presence of left artificial knee joint: Secondary | ICD-10-CM | POA: Insufficient documentation

## 2015-06-23 DIAGNOSIS — M25662 Stiffness of left knee, not elsewhere classified: Secondary | ICD-10-CM

## 2015-06-23 DIAGNOSIS — Z7409 Other reduced mobility: Secondary | ICD-10-CM | POA: Insufficient documentation

## 2015-06-23 DIAGNOSIS — R262 Difficulty in walking, not elsewhere classified: Secondary | ICD-10-CM | POA: Insufficient documentation

## 2015-06-23 NOTE — Therapy (Signed)
[facility] [address] [place], [place], [address] Phone: [phone]   Fax:  [phone]  Physical Therapy Treatment  Patient Details  Name: Katherine Joseph MRN: [id number] Date of Birth: April 26, 1935 Referring Provider:  Carole Civil, MD  Encounter Date: 06/23/2015      PT End of Session - 06/23/15 1343    Visit Number 3   Number of Visits 24   Date for PT Re-Evaluation 08/18/15   Authorization Type Aetna Medicare/ Aetna HMO/PPO    Authorization Time Period 06/17/15-08/18/15   Authorization - Visit Number 3   Authorization - Number of Visits 10   PT Start Time 1300   PT Stop Time 1346   PT Time Calculation (min) 46 min   Activity Tolerance Patient tolerated treatment well   Behavior During Therapy [facility] for tasks assessed/performed      Past Medical History  Diagnosis Date  . GERD (gastroesophageal reflux disease)   . Hyperlipidemia   . Hypertension   . IBS (irritable bowel syndrome)   . Fluttering muscles SEP 2009    MBS/BASW NL  . Hemorrhoids, internal OCT 2011    Past Surgical History  Procedure Laterality Date  . Esophagogastroduodenoscopy  2006    W/DILATION W/RMR  . Right foot surgery    . Colonoscopy  2004    RMR/NUR INFLAMMATORY POLYP, POST POLYPECTOMY BLEED  . Colonoscopy  oct 2011 SLF nvd-WEIGHT LOSS    NL TI, [place]/DC TICS  . Knee arthroscopy    . Total knee arthroplasty Left 05/21/2015    Procedure: TOTAL KNEE ARTHROPLASTY;  Surgeon: Carole Civil, MD;  Location: [facility];  Service: Orthopedics;  Laterality: Left;    There were no vitals filed for this visit.  Visit Diagnosis:  Joint stiffness of knee, left  Knee pain, acute, left  Difficulty in walking involving lower leg joint  Impaired functional mobility, balance, and endurance      Subjective Assessment - 06/23/15 1307    Subjective Pt reports that she is having some achiness in her L leg, mainly below her knee.    Currently in  Pain? No/denies   Pain Score 0-No pain                         [facility] Adult PT Treatment/Exercise - 06/23/15 0001    Knee/Hip Exercises: Stretches   Active Hamstring Stretch 3 reps;30 seconds   Active Hamstring Stretch Limitations 12" step   Knee: Self-Stretch to increase Flexion Left;10 seconds   Knee: Self-Stretch Limitations knee drives for flexion on 12in step 10x 10"   Gastroc Stretch 3 reps;30 seconds   Gastroc Stretch Limitations slant board   Knee/Hip Exercises: Aerobic   Stationary Bike 6' seat 10 for ROM   Knee/Hip Exercises: Supine   Quad Sets 15 reps   Short Arc Quad Sets 15 reps   Heel Slides 10 reps   Bridges Limitations 15   Straight Leg Raises 10 reps   Other Supine Knee/Hip Exercises PROM knee extension x 10                  PT Short Term Goals - 06/17/15 1652    PT SHORT TERM GOAL #1   Title Pt will demonstrate independence in beginning home exercise program by twos weeks after commencement of therapy, to affirm self-efficacy in work at home to making progress toward goals.   PT SHORT TERM GOAL #2   Title  Pt will ambulate 282ft with LRAD s exacerbation of symptoms after 4 weeks, to demonstrate improved activity tolerance and improved independence in household ambulation.    PT SHORT TERM GOAL #3   Title Pt will improve gait speed by 50% by the 4th week of therapy to demonstrate improved indep in functional mobility and to decrease risk of falls in the home.            PT Long Term Goals - 06/17/15 1653    PT LONG TERM GOAL #1   Title Pt will demonstrate independence in advanced home exercise program by 1 week prior to discharge, to further self-efficacy in continuation of progress toward goals after discharge from therapy.    PT LONG TERM GOAL #2   Title Pt will ambulate 1046ft c LRAD and s exacerbation of symptoms after 8 weeks, to demonstrate improved activity tolerance and improved independence in household ambulation.    PT  LONG TERM GOAL #3   Title After 8 weeks, pt will improve gait speed by 80% to demonstrate improved indep in functional mobility and to decrease risk of falls in the home.                Plan - 06/23/15 1344    Clinical Impression Statement Continued focus on supine strengthening and ROM today. Added SLR and bridging to therex today, pt required verbal and tactile cueing to maintain knee extension during SLR, with quad lag noted due to decreased extension ROM and weakness in terminal extension range. Pt continues to demonstrate greatest ROM limitation in extension, PT performed PROM to knee extension in supine, and pt was educated on static stretching of knee with towel roll under ankle.     PT Home Exercise Plan Continue with supine strengthening, progress to standing heel raises and lunges        Problem List Patient Active Problem List   Diagnosis Date Noted  . Total knee replacement status 05/21/2015  . Arthritis of knee, degenerative 05/21/2015  . Arthritis of left knee   . Osteoarthritis of right knee 02/14/2014  . Hip pain, chronic 05/16/2013  . Right knee pain 05/16/2013  . Radicular leg pain 05/16/2013  . Scoliosis of lumbar spine 05/16/2013  . Constipation 06/30/2011  . Colon cancer screening 01/13/2011  . Microcytic anemia 09/03/2010  . WEIGHT LOSS 06/02/2010  . HYPERLIPIDEMIA 07/01/2009  . Essential hypertension 07/01/2009  . GERD 12/04/2008  . IBS 12/04/2008    Hilma Favors, PT, DPT [phone] 06/23/2015, 1:57 PM  [facility] [address] [place], [place], [address] Phone: [phone]   Fax:  [phone]

## 2015-06-25 ENCOUNTER — Ambulatory Visit (HOSPITAL_COMMUNITY): Payer: Medicare HMO | Admitting: Physical Therapy

## 2015-06-25 DIAGNOSIS — M25562 Pain in left knee: Secondary | ICD-10-CM

## 2015-06-25 DIAGNOSIS — M25662 Stiffness of left knee, not elsewhere classified: Secondary | ICD-10-CM

## 2015-06-25 DIAGNOSIS — R262 Difficulty in walking, not elsewhere classified: Secondary | ICD-10-CM

## 2015-06-25 DIAGNOSIS — Z7409 Other reduced mobility: Secondary | ICD-10-CM

## 2015-06-25 NOTE — Therapy (Signed)
Katherine Joseph, [place], [address] Phone: [phone]   Fax:  [phone]  Physical Therapy Treatment  Patient Details  Name: Katherine Joseph MRN: [id number] Date of Birth: May 23, 1935 Referring Provider:  Carole Civil, MD  Encounter Date: 06/25/2015      PT End of Session - 06/25/15 1359    Visit Number 4   Number of Visits 24   Date for PT Re-Evaluation 08/18/15   Authorization Type Aetna Medicare/ Aetna HMO/PPO    Authorization Time Period 06/17/15-08/18/15   Authorization - Visit Number 4   Authorization - Number of Visits 10   PT Start Time 1300   PT Stop Time 1345   PT Time Calculation (min) 45 min   Activity Tolerance Patient tolerated treatment well   Behavior During Therapy [facility] for tasks assessed/performed      Past Medical History  Diagnosis Date  . GERD (gastroesophageal reflux disease)   . Hyperlipidemia   . Hypertension   . IBS (irritable bowel syndrome)   . Fluttering muscles SEP 2009    MBS/BASW NL  . Hemorrhoids, internal OCT 2011    Past Surgical History  Procedure Laterality Date  . Esophagogastroduodenoscopy  2006    W/DILATION W/RMR  . Right foot surgery    . Colonoscopy  2004    RMR/NUR INFLAMMATORY POLYP, POST POLYPECTOMY BLEED  . Colonoscopy  oct 2011 SLF nvd-WEIGHT LOSS    NL TI, [place]/DC TICS  . Knee arthroscopy    . Total knee arthroplasty Left 05/21/2015    Procedure: TOTAL KNEE ARTHROPLASTY;  Surgeon: Carole Civil, MD;  Location: [facility];  Service: Orthopedics;  Laterality: Left;    There were no vitals filed for this visit.  Visit Diagnosis:  Joint stiffness of knee, left  Knee pain, acute, left  Difficulty in walking involving lower leg joint  Impaired functional mobility, balance, and endurance      Subjective Assessment - 06/25/15 1303    Subjective Pt states the front of her lower Lt LE hurts, 6/10.  STates her knee feels fine.  States she sat for a  couple hours at the MD office and could hardly walk afterward.    Currently in Pain? Yes   Pain Score 6    Pain Location Leg   Pain Orientation Left;Distal;Anterior                         [facility] Adult PT Treatment/Exercise - 06/25/15 1305    Knee/Hip Exercises: Stretches   Active Hamstring Stretch 3 reps;30 seconds   Active Hamstring Stretch Limitations 12" step   Knee: Self-Stretch to increase Flexion Left;10 seconds   Knee: Self-Stretch Limitations knee drives for flexion on 12in step 10x 10"   Gastroc Stretch 3 reps;30 seconds   Gastroc Stretch Limitations slant board   Knee/Hip Exercises: Standing   Heel Raises 10 reps   Heel Raises Limitations toeraises   Knee Flexion Both;10 reps   Forward Lunges Both;10 reps   Forward Lunges Limitations onto 4" step   Lateral Step Up Both;10 reps;Step Height: 4";Hand Hold: 1   Forward Step Up Both;10 reps;Step Height: 4";Hand Hold: 1   Manual Therapy   Manual Therapy Edema management;Soft tissue mobilization   Manual therapy comments with LE's elevated supine   Edema Management Lt distal LE to superior knee with elevation   Soft tissue mobilization to decrease adhesions and increase mobility  PT Short Term Goals - 06/17/15 1652    PT SHORT TERM GOAL #1   Title Pt will demonstrate independence in beginning home exercise program by twos weeks after commencement of therapy, to affirm self-efficacy in work at home to making progress toward goals.   PT SHORT TERM GOAL #2   Title Pt will ambulate 210ft with LRAD s exacerbation of symptoms after 4 weeks, to demonstrate improved activity tolerance and improved independence in household ambulation.    PT SHORT TERM GOAL #3   Title Pt will improve gait speed by 50% by the 4th week of therapy to demonstrate improved indep in functional mobility and to decrease risk of falls in the home.            PT Long Term Goals - 06/17/15 1653    PT LONG TERM  GOAL #1   Title Pt will demonstrate independence in advanced home exercise program by 1 week prior to discharge, to further self-efficacy in continuation of progress toward goals after discharge from therapy.    PT LONG TERM GOAL #2   Title Pt will ambulate 105ft c LRAD and s exacerbation of symptoms after 8 weeks, to demonstrate improved activity tolerance and improved independence in household ambulation.    PT LONG TERM GOAL #3   Title After 8 weeks, pt will improve gait speed by 80% to demonstrate improved indep in functional mobility and to decrease risk of falls in the home.                Plan - 06/25/15 1400    Clinical Impression Statement Progressed to standing/functional strengthening actvities today.  Pt needed 2 seated rest breaks due to fatigue.  Added solft tissue massage to Lt LE forefoot to superior knee due to increased pain and edema in this area. Overall reduction of edema at end of session with patient reporting no longer with pain.  Ecouraged patient to keep LE's elevated during the day and complete self massage.  Also discussed compression knee highs if swelling did not resolve in the next couple weeks.    PT Home Exercise Plan Continue to progress ROM and functional strengthening. complete manual to assist with edema reduction.        Problem List Patient Active Problem List   Diagnosis Date Noted  . Total knee replacement status 05/21/2015  . Arthritis of knee, degenerative 05/21/2015  . Arthritis of left knee   . Osteoarthritis of right knee 02/14/2014  . Hip pain, chronic 05/16/2013  . Right knee pain 05/16/2013  . Radicular leg pain 05/16/2013  . Scoliosis of lumbar spine 05/16/2013  . Constipation 06/30/2011  . Colon cancer screening 01/13/2011  . Microcytic anemia 09/03/2010  . WEIGHT LOSS 06/02/2010  . HYPERLIPIDEMIA 07/01/2009  . Essential hypertension 07/01/2009  . GERD 12/04/2008  . IBS 12/04/2008    Katherine Joseph,  PTA/CLT [phone]  06/25/2015, 2:05 PM  [facility] [address] [place], [place], [address] Phone: [phone]   Fax:  [phone]

## 2015-06-27 ENCOUNTER — Ambulatory Visit (HOSPITAL_COMMUNITY): Payer: Medicare HMO

## 2015-06-27 ENCOUNTER — Telehealth (HOSPITAL_COMMUNITY): Payer: Self-pay

## 2015-06-27 DIAGNOSIS — M25562 Pain in left knee: Secondary | ICD-10-CM

## 2015-06-27 DIAGNOSIS — Z96652 Presence of left artificial knee joint: Secondary | ICD-10-CM

## 2015-06-27 DIAGNOSIS — M25662 Stiffness of left knee, not elsewhere classified: Secondary | ICD-10-CM | POA: Diagnosis not present

## 2015-06-27 DIAGNOSIS — Z7409 Other reduced mobility: Secondary | ICD-10-CM

## 2015-06-27 DIAGNOSIS — R262 Difficulty in walking, not elsewhere classified: Secondary | ICD-10-CM

## 2015-06-27 NOTE — Therapy (Signed)
[facility] [place], [place], [address] Phone: [phone]   Fax:  [phone]  Physical Therapy Treatment  Patient Details  Name: ANILA BOJARSKI MRN: [id number] Date of Birth: [birth year] Referring Provider:  Carole Civil, MD  Encounter Date: 06/27/2015      PT End of Session - 06/27/15 1456    Visit Number 5   Number of Visits 24   Date for PT Re-Evaluation 08/18/15   Authorization Type Aetna Medicare/ Aetna HMO/PPO    Authorization Time Period 06/17/15-08/18/15   Authorization - Visit Number 5   Authorization - Number of Visits 10   PT Start Time 7564   PT Stop Time 1516   PT Time Calculation (min) 27 min   Activity Tolerance Patient tolerated treatment well   Behavior During Therapy [facility] for tasks assessed/performed      Past Medical History  Diagnosis Date  . GERD (gastroesophageal reflux disease)   . Hyperlipidemia   . Hypertension   . IBS (irritable bowel syndrome)   . Fluttering muscles SEP 2009    MBS/BASW NL  . Hemorrhoids, internal OCT 2011    Past Surgical History  Procedure Laterality Date  . Esophagogastroduodenoscopy  2006    W/DILATION W/RMR  . Right foot surgery    . Colonoscopy  2004    RMR/NUR INFLAMMATORY POLYP, POST POLYPECTOMY BLEED  . Colonoscopy  oct 2011 SLF nvd-WEIGHT LOSS    NL TI, [place]/DC TICS  . Knee arthroscopy    . Total knee arthroplasty Left 05/21/2015    Procedure: TOTAL KNEE ARTHROPLASTY;  Surgeon: Carole Civil, MD;  Location: [facility];  Service: Orthopedics;  Laterality: Left;    There were no vitals filed for this visit.  Visit Diagnosis:  Joint stiffness of knee, left  Knee pain, acute, left  Difficulty in walking involving lower leg joint  Impaired functional mobility, balance, and endurance  Artificial knee joint present, left      Subjective Assessment - 06/27/15 1451    Subjective Pt stated knee feels tight today with pain in shin of leg, pain  scale 5-6/10   Currently in Pain? Yes   Pain Score 5    Pain Location Knee   Pain Orientation Left;Distal;Anterior            [facility] PT Assessment - 06/27/15 0001    ROM / Strength   AROM / PROM / Strength AROM   AROM   AROM Assessment Site Knee   Right/Left Knee Left   Left Knee Extension 4  was 18   Left Knee Flexion 125  was 113          [facility] Adult PT Treatment/Exercise - 06/27/15 0001    Knee/Hip Exercises: Stretches   Active Hamstring Stretch 3 reps;30 seconds   Active Hamstring Stretch Limitations 12" step   Knee: Self-Stretch to increase Flexion Left;10 seconds   Knee: Self-Stretch Limitations knee drives for flexion on 12in step 10x 10"   Gastroc Stretch 3 reps;30 seconds   Gastroc Stretch Limitations slant board   Knee/Hip Exercises: Supine   Quad Sets 15 reps   Short Arc Quad Sets 15 reps   Heel Slides 10 reps   Heel Slides Limitations 4-125 degrees   Manual Therapy   Manual Therapy Edema management;Soft tissue mobilization   Manual therapy comments with LE's elevated supine   Edema Management Lt distal LE to superior knee with elevation   Soft tissue mobilization to decrease adhesions  and increase mobility                  PT Short Term Goals - 06/17/15 1652    PT SHORT TERM GOAL #1   Title Pt will demonstrate independence in beginning home exercise program by twos weeks after commencement of therapy, to affirm self-efficacy in work at home to making progress toward goals.   PT SHORT TERM GOAL #2   Title Pt will ambulate 232ft with LRAD s exacerbation of symptoms after 4 weeks, to demonstrate improved activity tolerance and improved independence in household ambulation.    PT SHORT TERM GOAL #3   Title Pt will improve gait speed by 50% by the 4th week of therapy to demonstrate improved indep in functional mobility and to decrease risk of falls in the home.            PT Long Term Goals - 06/17/15 1653    PT LONG TERM GOAL #1   Title Pt  will demonstrate independence in advanced home exercise program by 1 week prior to discharge, to further self-efficacy in continuation of progress toward goals after discharge from therapy.    PT LONG TERM GOAL #2   Title Pt will ambulate 1073ft c LRAD and s exacerbation of symptoms after 8 weeks, to demonstrate improved activity tolerance and improved independence in household ambulation.    PT LONG TERM GOAL #3   Title After 8 weeks, pt will improve gait speed by 80% to demonstrate improved indep in functional mobility and to decrease risk of falls in the home.                Plan - 06/27/15 1456    Clinical Impression Statement Pt late for apt today, unable to complete full POC.  Session focus on improving knee ROM with stretches, ROM exercises and manual techniqeues to assist wtih edema control and pain.  Pt ROM improving 4-125 degrees following manual and ROM exercises.  Pt encouraged to continue with knee extension HEP exercises and apply ice for edema and pain control.   PT Next Visit Plan Continue current PT POC to improve knee ROM to reduce tightness.   PT Home Exercise Plan Continue to progress ROM and functional strengthening. complete manual to assist with edema reduction.        Problem List Patient Active Problem List   Diagnosis Date Noted  . Total knee replacement status 05/21/2015  . Arthritis of knee, degenerative 05/21/2015  . Arthritis of left knee   . Osteoarthritis of right knee 02/14/2014  . Hip pain, chronic 05/16/2013  . Right knee pain 05/16/2013  . Radicular leg pain 05/16/2013  . Scoliosis of lumbar spine 05/16/2013  . Constipation 06/30/2011  . Colon cancer screening 01/13/2011  . Microcytic anemia 09/03/2010  . WEIGHT LOSS 06/02/2010  . HYPERLIPIDEMIA 07/01/2009  . Essential hypertension 07/01/2009  . GERD 12/04/2008  . IBS 12/04/2008   Ihor Austin, [place]; [place]  Aldona Lento 06/27/2015, 3:39 PM  [facility] [address] [place], [place], [address] Phone: [phone]   Fax:  [phone]

## 2015-07-01 ENCOUNTER — Ambulatory Visit (HOSPITAL_COMMUNITY): Payer: Medicare HMO | Admitting: Physical Therapy

## 2015-07-01 DIAGNOSIS — R262 Difficulty in walking, not elsewhere classified: Secondary | ICD-10-CM

## 2015-07-01 DIAGNOSIS — M25562 Pain in left knee: Secondary | ICD-10-CM

## 2015-07-01 DIAGNOSIS — Z7409 Other reduced mobility: Secondary | ICD-10-CM

## 2015-07-01 DIAGNOSIS — M25662 Stiffness of left knee, not elsewhere classified: Secondary | ICD-10-CM

## 2015-07-01 NOTE — Therapy (Signed)
[facility] [place], [place], [address] Phone: [phone]   Fax:  [phone]  Physical Therapy Treatment  Patient Details  Name: Katherine Joseph MRN: [id number] Date of Birth: 15-Oct-1934 Referring Provider:  Carole Civil, MD  Encounter Date: 07/01/2015      PT End of Session - 07/01/15 1704    Visit Number 6   Number of Visits 24   Date for PT Re-Evaluation 07/17/15   Authorization Type Aetna Medicare/ Aetna HMO/PPO    Authorization Time Period 06/17/15-08/18/15   Authorization - Visit Number 6   Authorization - Number of Visits 10   PT Start Time 1430   PT Stop Time 1512   PT Time Calculation (min) 42 min   Activity Tolerance Patient tolerated treatment well   Behavior During Therapy [facility] for tasks assessed/performed      Past Medical History  Diagnosis Date  . GERD (gastroesophageal reflux disease)   . Hyperlipidemia   . Hypertension   . IBS (irritable bowel syndrome)   . Fluttering muscles SEP 2009    MBS/BASW NL  . Hemorrhoids, internal OCT 2011    Past Surgical History  Procedure Laterality Date  . Esophagogastroduodenoscopy  2006    W/DILATION W/RMR  . Right foot surgery    . Colonoscopy  2004    RMR/NUR INFLAMMATORY POLYP, POST POLYPECTOMY BLEED  . Colonoscopy  oct 2011 SLF nvd-WEIGHT LOSS    NL TI, [place]/DC TICS  . Knee arthroscopy    . Total knee arthroplasty Left 05/21/2015    Procedure: TOTAL KNEE ARTHROPLASTY;  Surgeon: Carole Civil, MD;  Location: [facility];  Service: Orthopedics;  Laterality: Left;    There were no vitals filed for this visit.  Visit Diagnosis:  Joint stiffness of knee, left  Knee pain, acute, left  Difficulty in walking involving lower leg joint  Impaired functional mobility, balance, and endurance      Subjective Assessment - 07/01/15 1438    Subjective Pt reports that her knee feels stiff and tight today, rating pain as a 5 or 6.    Pain Score 5    Pain Location Knee   Pain Orientation Left                [facility] Adult PT Treatment/Exercise - 07/01/15 0001    Knee/Hip Exercises: Stretches   Active Hamstring Stretch 3 reps;30 seconds   Active Hamstring Stretch Limitations 14" step   Knee: Self-Stretch to increase Flexion Left;10 seconds   Knee: Self-Stretch Limitations knee drives for flexion on 12in step 10x 10"   Gastroc Stretch 3 reps;30 seconds   Gastroc Stretch Limitations slant board   Knee/Hip Exercises: Standing   Heel Raises 15 reps   Terminal Knee Extension Limitations 15 reps green tband   Lateral Step Up Both;10 reps;Step Height: 4";Hand Hold: 1   Forward Step Up Both;10 reps;Step Height: 4";Hand Hold: 1   Knee/Hip Exercises: Supine   Quad Sets 15 reps   Quad Sets Limitations with heel on towel roll   Short Arc Quad Sets 15 reps   Heel Slides 15 reps   Straight Leg Raises 10 reps                  PT Short Term Goals - 06/17/15 1652    PT SHORT TERM GOAL #1   Title Pt will demonstrate independence in beginning home exercise program by twos weeks after commencement of therapy, to affirm self-efficacy in  work at home to making progress toward goals.   PT SHORT TERM GOAL #2   Title Pt will ambulate 262ft with LRAD s exacerbation of symptoms after 4 weeks, to demonstrate improved activity tolerance and improved independence in household ambulation.    PT SHORT TERM GOAL #3   Title Pt will improve gait speed by 50% by the 4th week of therapy to demonstrate improved indep in functional mobility and to decrease risk of falls in the home.            PT Long Term Goals - 06/17/15 1653    PT LONG TERM GOAL #1   Title Pt will demonstrate independence in advanced home exercise program by 1 week prior to discharge, to further self-efficacy in continuation of progress toward goals after discharge from therapy.    PT LONG TERM GOAL #2   Title Pt will ambulate 1052ft c LRAD and s exacerbation of symptoms  after 8 weeks, to demonstrate improved activity tolerance and improved independence in household ambulation.    PT LONG TERM GOAL #3   Title After 8 weeks, pt will improve gait speed by 80% to demonstrate improved indep in functional mobility and to decrease risk of falls in the home.                Plan - 07/01/15 1705    Clinical Impression Statement Continued focus on knee ROM and LE strength today. Pt was able to achieve -2 degrees of knee extension following SAQ and quad sets with heel on towel roll. Pt was able to complete all functional strengthening today with no c/o pain, required 3 rest breaks throughout session due to fatigue.    PT Next Visit Plan Continue with ROM activities and functional strengthening        Problem List Patient Active Problem List   Diagnosis Date Noted  . Total knee replacement status 05/21/2015  . Arthritis of knee, degenerative 05/21/2015  . Arthritis of left knee   . Osteoarthritis of right knee 02/14/2014  . Hip pain, chronic 05/16/2013  . Right knee pain 05/16/2013  . Radicular leg pain 05/16/2013  . Scoliosis of lumbar spine 05/16/2013  . Constipation 06/30/2011  . Colon cancer screening 01/13/2011  . Microcytic anemia 09/03/2010  . WEIGHT LOSS 06/02/2010  . HYPERLIPIDEMIA 07/01/2009  . Essential hypertension 07/01/2009  . GERD 12/04/2008  . IBS 12/04/2008    Hilma Favors, PT, DPT [phone] 07/01/2015, 5:09 PM  [facility] [address] [place], [place], [address] Phone: [phone]   Fax:  [phone]

## 2015-07-03 ENCOUNTER — Ambulatory Visit (HOSPITAL_COMMUNITY): Payer: Medicare HMO | Admitting: Physical Therapy

## 2015-07-03 DIAGNOSIS — R262 Difficulty in walking, not elsewhere classified: Secondary | ICD-10-CM

## 2015-07-03 DIAGNOSIS — Z96652 Presence of left artificial knee joint: Secondary | ICD-10-CM

## 2015-07-03 DIAGNOSIS — M25562 Pain in left knee: Secondary | ICD-10-CM

## 2015-07-03 DIAGNOSIS — M25662 Stiffness of left knee, not elsewhere classified: Secondary | ICD-10-CM

## 2015-07-03 DIAGNOSIS — Z7409 Other reduced mobility: Secondary | ICD-10-CM

## 2015-07-03 NOTE — Therapy (Signed)
[place], [place], [address] Phone: [phone]   Fax:  [phone]  Physical Therapy Treatment  Patient Details  Name: Katherine Joseph MRN: [id number] Date of Birth: Jan 18, 1935 Referring Provider:  Carole Civil, MD  Encounter Date: 07/03/2015      PT End of Session - 07/03/15 1457    Visit Number 7   Number of Visits 24   Date for PT Re-Evaluation 07/17/15   Authorization Type Aetna Medicare/ Aetna HMO/PPO    Authorization Time Period 06/17/15-08/18/15   Authorization - Visit Number 7   Authorization - Number of Visits 10   PT Start Time 6629   PT Stop Time 1516   PT Time Calculation (min) 38 min   Activity Tolerance Patient tolerated treatment well   Behavior During Therapy [facility] for tasks assessed/performed      Past Medical History  Diagnosis Date  . GERD (gastroesophageal reflux disease)   . Hyperlipidemia   . Hypertension   . IBS (irritable bowel syndrome)   . Fluttering muscles SEP 2009    MBS/BASW NL  . Hemorrhoids, internal OCT 2011    Past Surgical History  Procedure Laterality Date  . Esophagogastroduodenoscopy  2006    W/DILATION W/RMR  . Right foot surgery    . Colonoscopy  2004    RMR/NUR INFLAMMATORY POLYP, POST POLYPECTOMY BLEED  . Colonoscopy  oct 2011 SLF nvd-WEIGHT LOSS    NL TI, [place]/DC TICS  . Knee arthroscopy    . Total knee arthroplasty Left 05/21/2015    Procedure: TOTAL KNEE ARTHROPLASTY;  Surgeon: Carole Civil, MD;  Location: [facility];  Service: Orthopedics;  Laterality: Left;    There were no vitals filed for this visit.  Visit Diagnosis:  Joint stiffness of knee, left  Knee pain, acute, left  Difficulty in walking involving lower leg joint  Impaired functional mobility, balance, and endurance  Artificial knee joint present, left      Subjective Assessment - 07/03/15 1440    Subjective PT states her pain was up to 5/10 prior to taking a painpill  and now she has no pain. States her knee is still tight on the outside and swells some too.   Currently in Pain? No/denies                         [facility] Adult PT Treatment/Exercise - 07/03/15 1444    Knee/Hip Exercises: Stretches   Active Hamstring Stretch 3 reps;30 seconds   Active Hamstring Stretch Limitations 14" step   Knee: Self-Stretch to increase Flexion Left;10 seconds   Knee: Self-Stretch Limitations knee drives for flexion on 12in step 10x 10"   Gastroc Stretch 3 reps;30 seconds   Gastroc Stretch Limitations slant board   Knee/Hip Exercises: Standing   Heel Raises 15 reps   Forward Lunges Both;10 reps   Forward Lunges Limitations onto 4" step   Lateral Step Up Both;15 reps;Step Height: 4";Hand Hold: 1   Forward Step Up Both;10 reps;Step Height: 6";Hand Hold: 1   Step Down Left;10 reps;Hand Hold: 1;Step Height: 4"   Manual Therapy   Manual Therapy Edema management;Soft tissue mobilization   Manual therapy comments with LE's elevated supine   Edema Management Lt distal LE to superior knee with elevation   Soft tissue mobilization to decrease adhesions and increase mobility                  PT Short  Term Goals - 06/17/15 1652    PT SHORT TERM GOAL #1   Title Pt will demonstrate independence in beginning home exercise program by twos weeks after commencement of therapy, to affirm self-efficacy in work at home to making progress toward goals.   PT SHORT TERM GOAL #2   Title Pt will ambulate 212ft with LRAD s exacerbation of symptoms after 4 weeks, to demonstrate improved activity tolerance and improved independence in household ambulation.    PT SHORT TERM GOAL #3   Title Pt will improve gait speed by 50% by the 4th week of therapy to demonstrate improved indep in functional mobility and to decrease risk of falls in the home.            PT Long Term Goals - 06/17/15 1653    PT LONG TERM GOAL #1   Title Pt will demonstrate independence in  advanced home exercise program by 1 week prior to discharge, to further self-efficacy in continuation of progress toward goals after discharge from therapy.    PT LONG TERM GOAL #2   Title Pt will ambulate 1076ft c LRAD and s exacerbation of symptoms after 8 weeks, to demonstrate improved activity tolerance and improved independence in household ambulation.    PT LONG TERM GOAL #3   Title After 8 weeks, pt will improve gait speed by 80% to demonstrate improved indep in functional mobility and to decrease risk of falls in the home.                Plan - 07/03/15 1530    Clinical Impression Statement ROM and stength continue to improve as well as reduction in overall  pain. Continued swelling and tightness around the knee but able to decrease with manual techniques. Progressed reps of standing therex with addition of forward step downs and progression to 6" step with forward step ups.  Pt did not require a rest break during session today.   Pt will benefit from skilled therapeutic intervention in order to improve on the following deficits Cardiopulmonary status limiting activity   PT Next Visit Plan Continue with ROM activities and functional strengthening.  Gcodes and re-eval X 3 more sessions prior to MD appointment.         Problem List Patient Active Problem List   Diagnosis Date Noted  . Total knee replacement status 05/21/2015  . Arthritis of knee, degenerative 05/21/2015  . Arthritis of left knee   . Osteoarthritis of right knee 02/14/2014  . Hip pain, chronic 05/16/2013  . Right knee pain 05/16/2013  . Radicular leg pain 05/16/2013  . Scoliosis of lumbar spine 05/16/2013  . Constipation 06/30/2011  . Colon cancer screening 01/13/2011  . Microcytic anemia 09/03/2010  . WEIGHT LOSS 06/02/2010  . HYPERLIPIDEMIA 07/01/2009  . Essential hypertension 07/01/2009  . GERD 12/04/2008  . IBS 12/04/2008    Teena Irani, PTA/CLT [phone]  07/03/2015, 3:43 PM  [place] [address] [place], [place], [address] Phone: [phone]   Fax:  [phone]

## 2015-07-08 ENCOUNTER — Ambulatory Visit (HOSPITAL_COMMUNITY): Payer: Medicare HMO | Admitting: Physical Therapy

## 2015-07-08 DIAGNOSIS — M25562 Pain in left knee: Secondary | ICD-10-CM

## 2015-07-08 DIAGNOSIS — M25662 Stiffness of left knee, not elsewhere classified: Secondary | ICD-10-CM | POA: Diagnosis not present

## 2015-07-08 DIAGNOSIS — R262 Difficulty in walking, not elsewhere classified: Secondary | ICD-10-CM

## 2015-07-08 NOTE — Therapy (Signed)
[facility] [address] [place], [place], [address] Phone: [phone]   Fax:  [phone]  Physical Therapy Treatment  Patient Details  Name: Katherine Joseph MRN: [id number] Date of Birth: [birth year] Referring Provider: Aline Brochure  Encounter Date: 07/08/2015      PT End of Session - 07/08/15 1623    Visit Number 8   Number of Visits 24   Date for PT Re-Evaluation 07/17/15   Authorization Type Aetna Medicare/ Aetna HMO/PPO    Authorization Time Period 06/17/15-08/18/15   Authorization - Visit Number 8   Authorization - Number of Visits 10   PT Start Time 3151   PT Stop Time 1515   PT Time Calculation (min) 30 min   Equipment Utilized During Treatment Gait belt   Activity Tolerance Patient tolerated treatment well   Behavior During Therapy [facility] for tasks assessed/performed      Past Medical History  Diagnosis Date  . GERD (gastroesophageal reflux disease)   . Hyperlipidemia   . Hypertension   . IBS (irritable bowel syndrome)   . Fluttering muscles SEP 2009    MBS/BASW NL  . Hemorrhoids, internal OCT 2011    Past Surgical History  Procedure Laterality Date  . Esophagogastroduodenoscopy  2006    W/DILATION W/RMR  . Right foot surgery    . Colonoscopy  2004    RMR/NUR INFLAMMATORY POLYP, POST POLYPECTOMY BLEED  . Colonoscopy  oct 2011 SLF nvd-WEIGHT LOSS    NL TI, [place]/DC TICS  . Knee arthroscopy    . Total knee arthroplasty Left 05/21/2015    Procedure: TOTAL KNEE ARTHROPLASTY;  Surgeon: Carole Civil, MD;  Location: [facility];  Service: Orthopedics;  Laterality: Left;    There were no vitals filed for this visit.  Visit Diagnosis:  Joint stiffness of knee, left  Knee pain, acute, left  Difficulty in walking involving lower leg joint      Subjective Assessment - 07/08/15 1448    Subjective Pt reports that she has some pain today, rates it at a 3/10.    Currently in Pain? Yes   Pain Score 3    Pain Location  Knee   Pain Orientation Left            [facility] PT Assessment - 07/08/15 0001    Assessment   Referring Provider Jobe Marker Adult PT Treatment/Exercise - 07/08/15 0001    Knee/Hip Exercises: Stretches   Active Hamstring Stretch 3 reps;30 seconds   Active Hamstring Stretch Limitations 12" step   Knee: Self-Stretch to increase Flexion Left;10 seconds   Knee: Self-Stretch Limitations knee drives for flexion on 12in step 10x 10"   Gastroc Stretch 3 reps;30 seconds   Gastroc Stretch Limitations slant board   Knee/Hip Exercises: Standing   Heel Raises 15 reps   Forward Lunges 15 reps   Forward Lunges Limitations onto 4" step   Lateral Step Up 15 reps;Step Height: 6";Hand Hold: 1   Forward Step Up 15 reps;Step Height: 6";Hand Hold: 1   Other Standing Knee Exercises sidestepping x 2 RT   Knee/Hip Exercises: Seated   Sit to Sand 2 sets;5 reps;without UE support                  PT Short Term Goals - 06/17/15 1652    PT SHORT TERM GOAL #1   Title Pt will  demonstrate independence in beginning home exercise program by twos weeks after commencement of therapy, to affirm self-efficacy in work at home to making progress toward goals.   PT SHORT TERM GOAL #2   Title Pt will ambulate 256ft with LRAD s exacerbation of symptoms after 4 weeks, to demonstrate improved activity tolerance and improved independence in household ambulation.    PT SHORT TERM GOAL #3   Title Pt will improve gait speed by 50% by the 4th week of therapy to demonstrate improved indep in functional mobility and to decrease risk of falls in the home.            PT Long Term Goals - 06/17/15 1653    PT LONG TERM GOAL #1   Title Pt will demonstrate independence in advanced home exercise program by 1 week prior to discharge, to further self-efficacy in continuation of progress toward goals after discharge from therapy.    PT LONG TERM GOAL #2   Title Pt will ambulate 1021ft  c LRAD and s exacerbation of symptoms after 8 weeks, to demonstrate improved activity tolerance and improved independence in household ambulation.    PT LONG TERM GOAL #3   Title After 8 weeks, pt will improve gait speed by 80% to demonstrate improved indep in functional mobility and to decrease risk of falls in the home.                Plan - 07/08/15 1624    Clinical Impression Statement Pt 15 minutes late for appointment today. Functional strengthening was continued today, step ups were progressed to 6" step to improve strength and improve ability to climb stairs. Sidestepping was added today to improve hip abductor strength and balance, pt required verbal cueing to maintain proper foot position during the exercise. Pt required 3 seated rest breaks due to fatigue during treatment session.    PT Next Visit Plan Continue with ROM and functional strengthening, continue with STS training to improve strength and mobility        Problem List Patient Active Problem List   Diagnosis Date Noted  . Total knee replacement status 05/21/2015  . Arthritis of knee, degenerative 05/21/2015  . Arthritis of left knee   . Osteoarthritis of right knee 02/14/2014  . Hip pain, chronic 05/16/2013  . Right knee pain 05/16/2013  . Radicular leg pain 05/16/2013  . Scoliosis of lumbar spine 05/16/2013  . Constipation 06/30/2011  . Colon cancer screening 01/13/2011  . Microcytic anemia 09/03/2010  . WEIGHT LOSS 06/02/2010  . HYPERLIPIDEMIA 07/01/2009  . Essential hypertension 07/01/2009  . GERD 12/04/2008  . IBS 12/04/2008    Hilma Favors, PT, DPT [phone] 07/08/2015, 4:26 PM  [facility] [address] [place], [place], [address] Phone: [phone]   Fax:  [phone]  Name: NEFTALI THUROW MRN: [id number] Date of Birth: Dec 23, 1934

## 2015-07-10 ENCOUNTER — Ambulatory Visit (HOSPITAL_COMMUNITY): Payer: Medicare HMO | Admitting: Physical Therapy

## 2015-07-10 DIAGNOSIS — M25662 Stiffness of left knee, not elsewhere classified: Secondary | ICD-10-CM | POA: Diagnosis not present

## 2015-07-10 DIAGNOSIS — M25562 Pain in left knee: Secondary | ICD-10-CM

## 2015-07-10 DIAGNOSIS — R262 Difficulty in walking, not elsewhere classified: Secondary | ICD-10-CM

## 2015-07-10 DIAGNOSIS — Z7409 Other reduced mobility: Secondary | ICD-10-CM

## 2015-07-10 NOTE — Therapy (Signed)
[facility] [address] [place], [place], [address] Phone: [phone]   Fax:  [phone]  Physical Therapy Treatment  Patient Details  Name: Katherine Joseph MRN: [id number] Date of Birth: 1935/03/19 Referring Provider: Aline Brochure  Encounter Date: 07/10/2015      PT End of Session - 07/10/15 1447    Visit Number 9   Number of Visits 24   Date for PT Re-Evaluation 07/17/15   Authorization Type Aetna Medicare/ Aetna HMO/PPO    Authorization Time Period 06/17/15-08/18/15   Authorization - Visit Number 9   Authorization - Number of Visits 10   PT Start Time 6803   PT Stop Time 1429   PT Time Calculation (min) 35 min   Equipment Utilized During Treatment Gait belt   Activity Tolerance Patient tolerated treatment well   Behavior During Therapy [facility] for tasks assessed/performed      Past Medical History  Diagnosis Date  . GERD (gastroesophageal reflux disease)   . Hyperlipidemia   . Hypertension   . IBS (irritable bowel syndrome)   . Fluttering muscles SEP 2009    MBS/BASW NL  . Hemorrhoids, internal OCT 2011    Past Surgical History  Procedure Laterality Date  . Esophagogastroduodenoscopy  2006    W/DILATION W/RMR  . Right foot surgery    . Colonoscopy  2004    RMR/NUR INFLAMMATORY POLYP, POST POLYPECTOMY BLEED  . Colonoscopy  oct 2011 SLF nvd-WEIGHT LOSS    NL TI, [place]/DC TICS  . Knee arthroscopy    . Total knee arthroplasty Left 05/21/2015    Procedure: TOTAL KNEE ARTHROPLASTY;  Surgeon: Carole Civil, MD;  Location: [facility];  Service: Orthopedics;  Laterality: Left;    There were no vitals filed for this visit.  Visit Diagnosis:  Joint stiffness of knee, left  Knee pain, acute, left  Difficulty in walking involving lower leg joint  Impaired functional mobility, balance, and endurance      Subjective Assessment - 07/10/15 1355    Subjective Pt reports that she has some knee pain today, it is aching on the  lateral side of her knee where she has a lot of numbness.    Currently in Pain? Yes   Pain Score 3    Pain Location Knee   Pain Orientation Left                         [facility] Adult PT Treatment/Exercise - 07/10/15 0001    Knee/Hip Exercises: Stretches   Active Hamstring Stretch 3 reps;30 seconds   Active Hamstring Stretch Limitations 12" step   Knee: Self-Stretch to increase Flexion Left;10 seconds   Knee: Self-Stretch Limitations knee drives for flexion on 12in step 10x 10"   Gastroc Stretch 3 reps;30 seconds   Gastroc Stretch Limitations slant board   Knee/Hip Exercises: Standing   Heel Raises 15 reps   Heel Raises Limitations toeraises   Lateral Step Up 15 reps;Step Height: 6";Hand Hold: 1   Forward Step Up 15 reps;Step Height: 6";Hand Hold: 1   Other Standing Knee Exercises sidestepping with RTB x 2 RT   Knee/Hip Exercises: Seated   Sit to Sand 2 sets;5 reps;without UE support                  PT Short Term Goals - 06/17/15 1652    PT SHORT TERM GOAL #1   Title Pt will demonstrate independence in beginning home exercise program by  twos weeks after commencement of therapy, to affirm self-efficacy in work at home to making progress toward goals.   PT SHORT TERM GOAL #2   Title Pt will ambulate 240ft with LRAD s exacerbation of symptoms after 4 weeks, to demonstrate improved activity tolerance and improved independence in household ambulation.    PT SHORT TERM GOAL #3   Title Pt will improve gait speed by 50% by the 4th week of therapy to demonstrate improved indep in functional mobility and to decrease risk of falls in the home.            PT Long Term Goals - 06/17/15 1653    PT LONG TERM GOAL #1   Title Pt will demonstrate independence in advanced home exercise program by 1 week prior to discharge, to further self-efficacy in continuation of progress toward goals after discharge from therapy.    PT LONG TERM GOAL #2   Title Pt will ambulate  1056ft c LRAD and s exacerbation of symptoms after 8 weeks, to demonstrate improved activity tolerance and improved independence in household ambulation.    PT LONG TERM GOAL #3   Title After 8 weeks, pt will improve gait speed by 80% to demonstrate improved indep in functional mobility and to decrease risk of falls in the home.                Plan - 07/10/15 1448    Clinical Impression Statement PT 9 minutes late for appointment today, unable to copmlete full treatment session. Continued with functional strengthening to improve gait mechanics and functional mobility. Progressed sidestepping with red theraband to increase strength of hip abductors and to challenge balance. Pt required frequent rest breaks in today's treatment due to fatigue.     PT Next Visit Plan Continue with functional strengthening, begin stair training next session        Problem List Patient Active Problem List   Diagnosis Date Noted  . Total knee replacement status 05/21/2015  . Arthritis of knee, degenerative 05/21/2015  . Arthritis of left knee   . Osteoarthritis of right knee 02/14/2014  . Hip pain, chronic 05/16/2013  . Right knee pain 05/16/2013  . Radicular leg pain 05/16/2013  . Scoliosis of lumbar spine 05/16/2013  . Constipation 06/30/2011  . Colon cancer screening 01/13/2011  . Microcytic anemia 09/03/2010  . WEIGHT LOSS 06/02/2010  . HYPERLIPIDEMIA 07/01/2009  . Essential hypertension 07/01/2009  . GERD 12/04/2008  . IBS 12/04/2008    Hilma Favors, PT, DPT [phone] 07/10/2015, 2:51 PM  [place] [address] [place], [place], [address] Phone: [phone]   Fax:  [phone]  Name: Katherine Joseph MRN: [id number] Date of Birth: 1935-06-02

## 2015-07-15 ENCOUNTER — Ambulatory Visit (HOSPITAL_COMMUNITY): Payer: Medicare HMO | Admitting: Physical Therapy

## 2015-07-15 DIAGNOSIS — M25662 Stiffness of left knee, not elsewhere classified: Secondary | ICD-10-CM

## 2015-07-15 DIAGNOSIS — R262 Difficulty in walking, not elsewhere classified: Secondary | ICD-10-CM

## 2015-07-15 DIAGNOSIS — Z7409 Other reduced mobility: Secondary | ICD-10-CM

## 2015-07-15 DIAGNOSIS — M25562 Pain in left knee: Secondary | ICD-10-CM

## 2015-07-15 NOTE — Therapy (Signed)
[facility] [address] [place], [place], [address] Phone: [phone]   Fax:  [phone]  Physical Therapy Treatment  Patient Details  Name: Katherine Joseph MRN: [id number] Date of Birth: Dec 20, 1934 Referring Provider: Aline Brochure  Encounter Date: 07/15/2015      PT End of Session - 07/15/15 1504    Visit Number 10   Number of Visits 24   Authorization Type A[employer]na Medicare/ Aetna HMO/PPO    Authorization Time Period 06/17/15-08/18/15   Authorization - Visit Number 10   Authorization - Number of Visits 10   PT Start Time 1700   PT Stop Time 1430   PT Time Calculation (min) 42 min   Equipment Utilized During Treatment Gait belt   Activity Tolerance Patient tolerated treatment well   Behavior During Therapy [facility] for tasks assessed/performed      Past Medical History  Diagnosis Date  . GERD (gastroesophageal reflux disease)   . Hyperlipidemia   . Hypertension   . IBS (irritable bowel syndrome)   . Fluttering muscles SEP 2009    MBS/BASW NL  . Hemorrhoids, internal OCT 2011    Past Surgical History  Procedure Laterality Date  . Esophagogastroduodenoscopy  2006    W/DILATION W/RMR  . Right foot surgery    . Colonoscopy  2004    RMR/NUR INFLAMMATORY POLYP, POST POLYPECTOMY BLEED  . Colonoscopy  oct 2011 SLF nvd-WEIGHT LOSS    NL TI, [place]/DC TICS  . Knee arthroscopy    . Total knee arthroplasty Left 05/21/2015    Procedure: TOTAL KNEE ARTHROPLASTY;  Surgeon: Carole Civil, MD;  Location: [facility];  Service: Orthopedics;  Laterality: Left;    There were no vitals filed for this visit.  Visit Diagnosis:  Joint stiffness of knee, left  Knee pain, acute, left  Difficulty in walking involving lower leg joint  Impaired functional mobility, balance, and endurance      Subjective Assessment - 07/15/15 1351    Subjective Pt reports that she has been doing fine lately. She feels that she has gotten better since beginning  therapy, her biggest complaint is the numbness in her knee. She states that her walking, going up and down steps, and her balance have improved since beginning PT.             [facility] PT Assessment - 07/15/15 0001    Assessment   Referring Provider Aline Brochure   Observation/Other Assessments   Focus on Therapeutic Outcomes (FOTO)  38:  47% limited   Other:   Other/ Comments TUG: 13.10 seconds   ROM / Strength   AROM / PROM / Strength Strength   AROM   Left Knee Extension 5   Left Knee Flexion 125   Strength   Strength Assessment Site Hip;Knee   Right/Left Hip Right;Left   Right Hip Flexion 4/5   Right Hip Extension 3/5   Right Hip ABduction 4/5   Left Hip Flexion 4/5   Left Hip Extension 3-/5   Left Hip ABduction 3/5   Right/Left Knee Right;Left   Right Knee Flexion 4+/5   Right Knee Extension 4+/5   Left Knee Flexion 4+/5   Left Knee Extension 4+/5                     [facility] Adult PT Treatment/Exercise - 07/15/15 0001    Ambulation/Gait   Stairs Yes   Stairs Assistance 6: Modified independent (Device/Increase time)   Stair Management Technique Two rails;Alternating  pattern   Number of Stairs 12  4 x 3RT   Height of Stairs 7   Knee/Hip Exercises: Stretches   Active Hamstring Stretch 3 reps;30 seconds   Active Hamstring Stretch Limitations 12" step   Gastroc Stretch 3 reps;30 seconds   Gastroc Stretch Limitations slant board   Knee/Hip Exercises: Standing   Hip Abduction 15 reps   Hip Extension 15 reps   Other Standing Knee Exercises sidestepping with RTB x 2 RT   Knee/Hip Exercises: Seated   Sit to Sand 2 sets;5 reps;without UE support   Knee/Hip Exercises: Supine   Bridges Limitations 15                PT Education - 07/15/15 1503    Education provided Yes   Education Details HEP reviewed and updated, goals reviewed   Person(s) Educated Patient   Methods Explanation;Demonstration;Handout   Comprehension Verbalized understanding;Returned  demonstration          PT Short Term Goals - 07/15/15 1504    PT SHORT TERM GOAL #1   Title Pt will demonstrate independence in beginning home exercise program by twos weeks after commencement of therapy, to affirm self-efficacy in work at home to making progress toward goals.   Status Achieved   PT SHORT TERM GOAL #2   Title Pt will ambulate 28f with LRAD s exacerbation of symptoms after 4 weeks, to demonstrate improved activity tolerance and improved independence in household ambulation.    Status Achieved   PT SHORT TERM GOAL #3   Title Pt will improve gait speed by 50% by the 4th week of therapy to demonstrate improved indep in functional mobility and to decrease risk of falls in the home.    Status Achieved           PT Long Term Goals - 07/15/15 1504    PT LONG TERM GOAL #1   Title Pt will demonstrate independence in advanced home exercise program by 1 week prior to discharge, to further self-efficacy in continuation of progress toward goals after discharge from therapy.    Status Partially Met   PT LONG TERM GOAL #2   Title Pt will ambulate 10031fc LRAD and s exacerbation of symptoms after 8 weeks, to demonstrate improved activity tolerance and improved independence in household ambulation.    Status Achieved   PT LONG TERM GOAL #3   Title After 8 weeks, pt will improve gait speed by 80% to demonstrate improved indep in functional mobility and to decrease risk of falls in the home.    Status Partially Met               Plan - 07/15/15 1505    Clinical Impression Statement Reassessment completed today. Pt reports that she is no longer experiencing any functional limitations. She is able to ambulate safely and without pain using SPC, can navigate stairs with reciprocal pattern without pain, and demonstrates good strength and ROM in L knee. Pt continues to demonstrate weakness of gluts, however, this can continue to be addressed with HEP. Pt has been concerned about  her high copay for each session. Option of discharge to HEP was discussed with pt, who was agreeable to continuing independently.    Pt will benefit from skilled therapeutic intervention in order to improve on the following deficits Cardiopulmonary status limiting activity   PT Treatment/Interventions ADLs/Self Care Home Management;Therapeutic exercise;Therapeutic activities;Functional mobility training;Gait training;Stair training;Balance training;Patient/family education;Manual techniques;Scar mobilization  G-Codes - 07/15/15 1508    Functional Assessment Tool Used FOTO   Functional Limitation Mobility: Walking and moving around   Mobility: Walking and Moving Around Goal Status [phone]) At least 40 percent but less than 60 percent impaired, limited or restricted   Mobility: Walking and Moving Around Discharge Status [phone]) At least 40 percent but less than 60 percent impaired, limited or restricted      Problem List Patient Active Problem List   Diagnosis Date Noted  . Total knee replacement status 05/21/2015  . Arthritis of knee, degenerative 05/21/2015  . Arthritis of left knee   . Osteoarthritis of right knee 02/14/2014  . Hip pain, chronic 05/16/2013  . Right knee pain 05/16/2013  . Radicular leg pain 05/16/2013  . Scoliosis of lumbar spine 05/16/2013  . Constipation 06/30/2011  . Colon cancer screening 01/13/2011  . Microcytic anemia 09/03/2010  . WEIGHT LOSS 06/02/2010  . HYPERLIPIDEMIA 07/01/2009  . Essential hypertension 07/01/2009  . GERD 12/04/2008  . IBS 12/04/2008    PHYSICAL THERAPY DISCHARGE SUMMARY  Visits from Start of Care: 10  Current functional level related to goals / functional outcomes: Pt has demonstrated improvements in BLE strength, functional mobility, functional activity tolerance, and gait mechanics.    Remaining deficits: Pt continues to demonstrate gluteal weakness.    Education / Equipment: HEP  Plan: Patient agrees to  discharge.  Patient goals were partially met. Patient is being discharged due to being pleased with the current functional level.  ?????       Hilma Favors, PT, DPT [phone] 07/15/2015, 3:10 PM  [place], [place], [address] Phone: [phone]   Fax:  [phone]  Name: Katherine Joseph MRN: [id number] Date of Birth: [birth year]

## 2015-07-15 NOTE — Patient Instructions (Signed)
Bridging    Slowly raise buttocks from floor, keeping stomach tight. Repeat __15__ times per set. Do __1__ sets per session. Do __1__ sessions per day.  http://orth.exer.us/1097   Copyright  VHI. All rights reserved.  HIP / KNEE: Extension - Sit to Stand    Sitting, lean chest forward, raise hips up from surface. Straighten hips and knees. Weight bear equally on left and right sides. Backs of legs should not push off surface. __10_ reps per set, __1_ sets per day, _5__ days per week Use assistive device as needed.  Copyright  VHI. All rights reserved.  ABDUCTION: Standing (Active)    Stand, feet flat. Lift right leg out to side.  Complete _1__ sets of _10__ repetitions. Perform _1__ sessions per day.  http://gtsc.exer.us/111   Copyright  VHI. All rights reserved.  KNEE: Flexion - Standing    Bend knee to move heel toward buttocks. Do not move thigh forward. __10_ reps per set, _1__ sets per day, _5__ days per week Hold onto a support.  Copyright  VHI. All rights reserved.

## 2015-07-17 ENCOUNTER — Encounter (HOSPITAL_COMMUNITY): Payer: Medicare HMO

## 2015-07-17 ENCOUNTER — Ambulatory Visit (INDEPENDENT_AMBULATORY_CARE_PROVIDER_SITE_OTHER): Payer: Self-pay | Admitting: Orthopedic Surgery

## 2015-07-17 ENCOUNTER — Encounter: Payer: Self-pay | Admitting: Orthopedic Surgery

## 2015-07-17 VITALS — BP 117/64 | Ht 69.0 in | Wt 199.1 lb

## 2015-07-17 DIAGNOSIS — Z96652 Presence of left artificial knee joint: Secondary | ICD-10-CM

## 2015-07-17 DIAGNOSIS — Z471 Aftercare following joint replacement surgery: Secondary | ICD-10-CM

## 2015-07-17 NOTE — Progress Notes (Signed)
Patient ID: Katherine Joseph, female   DOB: 21-Sep-1934, 80 y.o.   MRN: [id number]  Follow up visit  Chief Complaint  Patient presents with  . Follow-up    1 month follow up Left TKA, DOS 05/21/15    BP 117/64 mmHg  Ht 5\' 9"  (1.753 m)  Wt 199 lb 1.6 oz (90.311 kg)  BMI 29.39 kg/m2    The patient had surgery 8 weeks ago doing well flexion 120 little slight slight full extension she is ambulatory with a cane she has some numbness on the lateral portion of the incision from the infrapatellar branch of the saphenous nerve  Continue exercises follow-up in 4 months

## 2015-07-22 ENCOUNTER — Encounter (HOSPITAL_COMMUNITY): Payer: Medicare HMO

## 2015-07-24 ENCOUNTER — Encounter (HOSPITAL_COMMUNITY): Payer: Medicare HMO

## 2015-07-30 NOTE — Telephone Encounter (Signed)
Called concerning apt date and time  Casey Cockerham, LPTA; CBIS [phone]  

## 2015-08-04 ENCOUNTER — Encounter: Payer: Self-pay | Admitting: Gastroenterology

## 2015-08-11 ENCOUNTER — Other Ambulatory Visit: Payer: Self-pay | Admitting: Internal Medicine

## 2015-08-12 ENCOUNTER — Other Ambulatory Visit: Payer: Self-pay | Admitting: Internal Medicine

## 2015-08-22 ENCOUNTER — Other Ambulatory Visit: Payer: Self-pay | Admitting: Internal Medicine

## 2015-08-25 ENCOUNTER — Other Ambulatory Visit (HOSPITAL_COMMUNITY): Payer: Self-pay | Admitting: Family Medicine

## 2015-08-25 DIAGNOSIS — Z1231 Encounter for screening mammogram for malignant neoplasm of breast: Secondary | ICD-10-CM

## 2015-09-01 ENCOUNTER — Ambulatory Visit (HOSPITAL_COMMUNITY)
Admission: RE | Admit: 2015-09-01 | Discharge: 2015-09-01 | Disposition: A | Discharge status: Home / Self Care | Payer: Medicare HMO | Source: Ambulatory Visit | Attending: Family Medicine | Admitting: Family Medicine

## 2015-09-01 DIAGNOSIS — Z1231 Encounter for screening mammogram for malignant neoplasm of breast: Secondary | ICD-10-CM | POA: Insufficient documentation

## 2015-09-10 ENCOUNTER — Encounter: Payer: Self-pay | Admitting: Gastroenterology

## 2015-09-10 ENCOUNTER — Ambulatory Visit (INDEPENDENT_AMBULATORY_CARE_PROVIDER_SITE_OTHER): Payer: Medicare HMO | Admitting: Gastroenterology

## 2015-09-10 VITALS — BP 139/76 | HR 80 | Temp 98.1°F | Ht 69.0 in | Wt 179.4 lb

## 2015-09-10 DIAGNOSIS — K581 Irritable bowel syndrome with constipation: Secondary | ICD-10-CM

## 2015-09-10 DIAGNOSIS — K219 Gastro-esophageal reflux disease without esophagitis: Secondary | ICD-10-CM

## 2015-09-10 DIAGNOSIS — Z1211 Encounter for screening for malignant neoplasm of colon: Secondary | ICD-10-CM | POA: Diagnosis not present

## 2015-09-10 DIAGNOSIS — R634 Abnormal weight loss: Secondary | ICD-10-CM | POA: Diagnosis not present

## 2015-09-10 MED ORDER — OMEPRAZOLE 20 MG PO CPDR: 20.0000 mg | DELAYED_RELEASE_CAPSULE | Freq: Every day | ORAL | Status: DC

## 2015-09-10 NOTE — Assessment & Plan Note (Signed)
CONSTIPATION PREDMOINANT. SYMPTOMS CONTROLLED/RESOLVED.  DRINK WATER. TAKE ALIGN DAILY.  USE MIRALAX AS NEEDED TO PREVENT CONSTIPATION. FOLLOW UP IN 1 YEAR.

## 2015-09-10 NOTE — Assessment & Plan Note (Signed)
NO WARNING SIGNS/SYMPTOMS  CONTINUE TO MONITOR FOR RECTAL BLEEDING OR MELENA.

## 2015-09-10 NOTE — Assessment & Plan Note (Signed)
DOWN 10 LBS IN 3 YEARS IN SETTING OF RECENT SURGERY.  CONTINUE TO MONITOR SYMPTOMS.

## 2015-09-10 NOTE — Patient Instructions (Addendum)
DRINK WATER TO KEEP YOUR URINE LIGHT YELLOW.  FOLLOW A HIGH FIBER DIET. AVOID ITEMS THAT CAUSE BLOATING & GAS. SEE INFO BELOW.  TAKE ALIGN DAILY.   USE MIRALAX AS NEEDED TO PREVENT CONSTIPATION.  CONTINUE OMEPRAZOLE. TAKE 30 MINUTES PRIOR TO YOUR FIRST MEAL.  FOLLOW UP IN 1 YEAR.   High-Fiber Diet A high-fiber diet changes your normal diet to include more whole grains, legumes, fruits, and vegetables. Changes in the diet involve replacing refined carbohydrates with unrefined foods. The calorie level of the diet is essentially unchanged. The Dietary Reference Intake (recommended amount) for adult males is 38 grams per day. For adult females, it is 25 grams per day. Pregnant and lactating women should consume 28 grams of fiber per day. Fiber is the intact part of a plant that is not broken down during digestion. Functional fiber is fiber that has been isolated from the plant to provide a beneficial effect in the body. PURPOSE  Increase stool bulk.   Ease and regulate bowel movements.   Lower cholesterol.   REDUCE RISK OF COLON CANCER  INDICATIONS THAT YOU NEED MORE FIBER  Constipation and hemorrhoids.   Uncomplicated diverticulosis (intestine condition) and irritable bowel syndrome.   Weight management.   As a protective measure against hardening of the arteries (atherosclerosis), diabetes, and cancer.   GUIDELINES FOR INCREASING FIBER IN THE DIET  Start adding fiber to the diet slowly. A gradual increase of about 5 more grams (2 slices of whole-wheat bread, 2 servings of most fruits or vegetables, or 1 bowl of high-fiber cereal) per day is best. Too rapid an increase in fiber may result in constipation, flatulence, and bloating.   Drink enough water and fluids to keep your urine clear or pale yellow. Water, juice, or caffeine-free drinks are recommended. Not drinking enough fluid may cause constipation.   Eat a variety of high-fiber foods rather than one type of fiber.    Try to increase your intake of fiber through using high-fiber foods rather than fiber pills or supplements that contain small amounts of fiber.   The goal is to change the types of food eaten. Do not supplement your present diet with high-fiber foods, but replace foods in your present diet.  INCLUDE A VARIETY OF FIBER SOURCES  Replace refined and processed grains with whole grains, canned fruits with fresh fruits, and incorporate other fiber sources. White rice, white breads, and most bakery goods contain little or no fiber.   Brown whole-grain rice, buckwheat oats, and many fruits and vegetables are all good sources of fiber. These include: broccoli, Brussels sprouts, cabbage, cauliflower, beets, sweet potatoes, white potatoes (skin on), carrots, tomatoes, eggplant, squash, berries, fresh fruits, and dried fruits.   Cereals appear to be the richest source of fiber. Cereal fiber is found in whole grains and bran. Bran is the fiber-rich outer coat of cereal grain, which is largely removed in refining. In whole-grain cereals, the bran remains. In breakfast cereals, the largest amount of fiber is found in those with "bran" in their names. The fiber content is sometimes indicated on the label.   You may need to include additional fruits and vegetables each day.   In baking, for 1 cup white flour, you may use the following substitutions:   1 cup whole-wheat flour minus 2 tablespoons.   1/2 cup white flour plus 1/2 cup whole-wheat flour.

## 2015-09-10 NOTE — Assessment & Plan Note (Signed)
SYMPTOMS CONTROLLED/RESOLVED.  CONTINUE OMEPRAZOLE.  TAKE 30 MINUTES PRIOR TO YOUR FIRST MEAL. FOLLOW UP IN 1 YEAR.  

## 2015-09-10 NOTE — Progress Notes (Signed)
ON RECALL  °

## 2015-09-10 NOTE — Progress Notes (Signed)
Subjective:    Patient ID: Katherine Joseph, female    DOB: Dec 12, 1934, 80 y.o.   MRN: [id number]  Robert Bellow, MD  HPI HAD LEFT KNEE REPLACED. LOST WEIGHT DEC 2015: 183 LBS AND WAS UP TO 192 LBS AND NOW 179 LBS. BMs: GOOD. LAST DIARRHEA: LONG TIME. WALKS ASSISTED WITH CANE ON THE RIGHT SIDE. PT DENIES FEVER, CHILLS, HEMATOCHEZIA, nausea, vomiting, melena, diarrhea, CHEST PAIN, SHORTNESS OF BREATH, CHANGE IN BOWEL IN HABITS, constipation, abdominal pain, problems swallowing, OR heartburn or indigestion.  Past Medical History  Diagnosis Date  . GERD (gastroesophageal reflux disease)   . Hyperlipidemia   . Hypertension   . IBS (irritable bowel syndrome)   . Fluttering muscles SEP 2009    MBS/BASW NL  . Hemorrhoids, internal OCT 2011   Past Surgical History  Procedure Laterality Date  . Esophagogastroduodenoscopy  2006    W/DILATION W/RMR  . Right foot surgery    . Colonoscopy  2004    RMR/NUR INFLAMMATORY POLYP, POST POLYPECTOMY BLEED  . Colonoscopy  oct 2011 SLF nvd-WEIGHT LOSS    NL TI, [place]/DC TICS  . Knee arthroscopy    . Total knee arthroplasty Left 05/21/2015       Allergies  Allergen Reactions  . Sulfa Antibiotics Hives  . Iodinated Diagnostic Agents Rash    Current Outpatient Prescriptions  Medication Sig Dispense Refill  . aspirin 81 MG tablet Take 81 mg by mouth daily.    Marland Kitchen atorvastatin (LIPITOR) 20 MG tablet Take 20 mg by mouth daily.      . divalproex (DEPAKOTE) 250 MG 24 hr tablet Take 1,000 mg by mouth at bedtime.      . gabapentin (NEURONTIN) 300 MG capsule Take 300 mg by mouth 3 (three) times daily.    Marland Kitchen HYDROcodone-acetaminophen (NORCO) 7.5-325 MG per tablet Take 1 tablet by mouth every 4 (four) hours as needed for moderate pain. Max APAP 3gm/24 hrs from all sources 2-3X/DAY   . lisinopril-hydrochlorothiazide (PRINZIDE,ZESTORETIC) 20-12.5 MG per tablet Take 1 tablet by mouth daily.      Marland Kitchen LORazepam (ATIVAN) 1 MG tablet Take one tablet by mouth once  daily for anxiety    . methocarbamol (ROBAXIN) 500 MG tablet Take 500 mg by mouth 4 (four) times daily.    . metoprolol tartrate (LOPRESSOR) 25 MG tablet  1/2 TAB TWICE DAILY     . omeprazole (PRILOSEC) 20 MG capsule Take 1 capsule (20 mg total) by mouth daily.    . polyethylene glycol (MIRALAX / GLYCOLAX) packet Take 17 g by mouth daily. Takes every am or every other day PRN-<1-2X/MO   . potassium chloride SA (KLOR-CON M20) 20 MEQ tablet Take 20 mEq by mouth daily.      . Probiotic Product (PROBIOTIC FORMULA) CAPS Take by mouth. ALIGN 1 PO DAILY    . torsemide (DEMADEX) 20 MG tablet Take 20 mg by mouth daily. Does reduction     Review of Systems PER HPI OTHERWISE ALL SYSTEMS ARE NEGATIVE.    Objective:   Physical Exam  Constitutional: She is oriented to person, place, and time. She appears well-developed and well-nourished. No distress.  HENT:  Head: Normocephalic and atraumatic.  Mouth/Throat: Oropharynx is clear and moist. No oropharyngeal exudate.  Eyes: Pupils are equal, round, and reactive to light. No scleral icterus.  Neck: Normal range of motion. Neck supple.  Cardiovascular: Normal rate, regular rhythm and normal heart sounds.   Pulmonary/Chest: Effort normal and breath sounds normal. No respiratory distress.  Abdominal: Soft. Bowel sounds are normal. She exhibits no distension. There is no tenderness.  Musculoskeletal: She exhibits no edema.  WALKS ASSISTED WITH A CANE.  Lymphadenopathy:    She has no cervical adenopathy.  Neurological: She is alert and oriented to person, place, and time.  NO NEW FOCAL DEFICITS  Psychiatric: She has a normal mood and affect.  Vitals reviewed.     Assessment & Plan:

## 2015-09-11 ENCOUNTER — Ambulatory Visit (INDEPENDENT_AMBULATORY_CARE_PROVIDER_SITE_OTHER): Payer: Medicare HMO | Admitting: Ophthalmology

## 2015-09-11 DIAGNOSIS — H35033 Hypertensive retinopathy, bilateral: Secondary | ICD-10-CM | POA: Diagnosis not present

## 2015-09-11 DIAGNOSIS — H33301 Unspecified retinal break, right eye: Secondary | ICD-10-CM

## 2015-09-11 DIAGNOSIS — H43813 Vitreous degeneration, bilateral: Secondary | ICD-10-CM | POA: Diagnosis not present

## 2015-09-11 DIAGNOSIS — I10 Essential (primary) hypertension: Secondary | ICD-10-CM | POA: Diagnosis not present

## 2015-09-11 DIAGNOSIS — H353134 Nonexudative age-related macular degeneration, bilateral, advanced atrophic with subfoveal involvement: Secondary | ICD-10-CM

## 2015-09-22 DIAGNOSIS — M25561 Pain in right knee: Secondary | ICD-10-CM | POA: Diagnosis not present

## 2015-09-22 DIAGNOSIS — M170 Bilateral primary osteoarthritis of knee: Secondary | ICD-10-CM | POA: Diagnosis not present

## 2015-09-22 DIAGNOSIS — G609 Hereditary and idiopathic neuropathy, unspecified: Secondary | ICD-10-CM | POA: Diagnosis not present

## 2015-09-22 DIAGNOSIS — Z79891 Long term (current) use of opiate analgesic: Secondary | ICD-10-CM | POA: Diagnosis not present

## 2015-10-14 DIAGNOSIS — Z96652 Presence of left artificial knee joint: Secondary | ICD-10-CM | POA: Diagnosis not present

## 2015-10-14 DIAGNOSIS — R600 Localized edema: Secondary | ICD-10-CM | POA: Diagnosis not present

## 2015-10-14 DIAGNOSIS — M170 Bilateral primary osteoarthritis of knee: Secondary | ICD-10-CM | POA: Diagnosis not present

## 2015-10-22 DIAGNOSIS — I10 Essential (primary) hypertension: Secondary | ICD-10-CM | POA: Diagnosis not present

## 2015-10-22 DIAGNOSIS — I2510 Atherosclerotic heart disease of native coronary artery without angina pectoris: Secondary | ICD-10-CM | POA: Diagnosis not present

## 2015-10-22 DIAGNOSIS — M545 Low back pain: Secondary | ICD-10-CM | POA: Diagnosis not present

## 2015-10-22 DIAGNOSIS — G4459 Other complicated headache syndrome: Secondary | ICD-10-CM | POA: Diagnosis not present

## 2015-10-22 DIAGNOSIS — G609 Hereditary and idiopathic neuropathy, unspecified: Secondary | ICD-10-CM | POA: Diagnosis not present

## 2015-10-22 DIAGNOSIS — K219 Gastro-esophageal reflux disease without esophagitis: Secondary | ICD-10-CM | POA: Diagnosis not present

## 2015-10-22 DIAGNOSIS — Z79899 Other long term (current) drug therapy: Secondary | ICD-10-CM | POA: Diagnosis not present

## 2015-11-10 NOTE — Therapy (Signed)
[place], [place], [address] Phone: [phone]   Fax:  [phone]  Patient Details  Name: Katherine Joseph MRN: [id number] Date of Birth: [birth year] Referring Provider:  Lemmie Evens, MD  Encounter Date: 11/10/2015   PHYSICAL THERAPY DISCHARGE SUMMARY  Visits from Start of Care: 10  Current functional level related to goals / functional outcomes: Has not returned since last skilled visit    Remaining deficits: Unable to assess    Education / Equipment: N/A  Plan: Patient agrees to discharge.  Patient goals were partially met. Patient is being discharged due to not returning since the last visit.  ?????       Deniece Ree PT, DPT [facility] [address] [place], [place], [address] Phone: [phone]   Fax:  [phone]

## 2015-11-11 DIAGNOSIS — E790 Hyperuricemia without signs of inflammatory arthritis and tophaceous disease: Secondary | ICD-10-CM | POA: Diagnosis not present

## 2015-11-11 DIAGNOSIS — G629 Polyneuropathy, unspecified: Secondary | ICD-10-CM | POA: Diagnosis not present

## 2015-11-11 DIAGNOSIS — I10 Essential (primary) hypertension: Secondary | ICD-10-CM | POA: Diagnosis not present

## 2015-11-11 DIAGNOSIS — R600 Localized edema: Secondary | ICD-10-CM | POA: Diagnosis not present

## 2015-11-17 ENCOUNTER — Ambulatory Visit (INDEPENDENT_AMBULATORY_CARE_PROVIDER_SITE_OTHER): Payer: PPO | Admitting: Orthopedic Surgery

## 2015-11-17 VITALS — BP 155/81 | Ht 69.0 in | Wt 179.0 lb

## 2015-11-17 DIAGNOSIS — M5441 Lumbago with sciatica, right side: Secondary | ICD-10-CM

## 2015-11-17 DIAGNOSIS — Z96652 Presence of left artificial knee joint: Secondary | ICD-10-CM | POA: Diagnosis not present

## 2015-11-17 DIAGNOSIS — S82891S Other fracture of right lower leg, sequela: Secondary | ICD-10-CM | POA: Diagnosis not present

## 2015-11-17 MED ORDER — METHYLPREDNISOLONE ACETATE 40 MG/ML IJ SUSP
40.0000 mg | Freq: Once | INTRAMUSCULAR | Status: AC
  Administered 2015-11-17: 40 mg via INTRAMUSCULAR

## 2015-11-17 NOTE — Addendum Note (Signed)
Addended by: Baldomero Lamy B on: 11/17/2015 05:04 PM   Modules accepted: Orders

## 2015-11-17 NOTE — Progress Notes (Signed)
Patient ID: Katherine Joseph, female   DOB: [birth year], 81 y.o.   MRN: [id number]  Chief Complaint  Patient presents with  . Follow-up    4 month rechech on left knee replacement, DOS 05-21-15.    HPI Katherine Joseph is a 81 y.o. female.  Follow-up for knee replacement in August 2016. Complains of small bilateral knee pain but has several other complaints today  #1 right ankle open treatment internal fixation several years ago she complains of foot and ankle swelling with pain which is present now for several weeks described as a dull ache associated with swelling of the foot  #2 complains of radicular pain running from her foot to her right hip with no history of injury but history of lumbar pain. Denies bowel or bladder dysfunction  Review of Systems Review of Systems 1. Neurologically no problems 2. Weight loss fever or chills 3. Bowel function no change 4. Bladder function no change   Past Medical History  Diagnosis Date  . GERD (gastroesophageal reflux disease)   . Hyperlipidemia   . Hypertension   . IBS (irritable bowel syndrome)   . Fluttering muscles SEP 2009    MBS/BASW NL  . Hemorrhoids, internal OCT 2011    Past Surgical History  Procedure Laterality Date  . Esophagogastroduodenoscopy  2006    W/DILATION W/RMR  . Right foot surgery    . Colonoscopy  2004    RMR/NUR INFLAMMATORY POLYP, POST POLYPECTOMY BLEED  . Colonoscopy  oct 2011 SLF nvd-WEIGHT LOSS    NL TI, [place]/DC TICS  . Knee arthroscopy    . Total knee arthroplasty Left 05/21/2015    Procedure: TOTAL KNEE ARTHROPLASTY;  Surgeon: Carole Civil, MD;  Location: [facility];  Service: Orthopedics;  Laterality: Left;    Social History Social History  Substance Use Topics  . Smoking status: Never Smoker   . Smokeless tobacco: Never Used  . Alcohol Use: No    Allergies  Allergen Reactions  . Sulfa Antibiotics Hives  . Iodinated Diagnostic Agents Rash    Current Outpatient Prescriptions  Medication  Sig Dispense Refill  . aspirin 81 MG tablet Take 81 mg by mouth daily.    Marland Kitchen atorvastatin (LIPITOR) 20 MG tablet Take 20 mg by mouth daily.      . divalproex (DEPAKOTE) 250 MG 24 hr tablet Take 1,000 mg by mouth at bedtime.      . gabapentin (NEURONTIN) 300 MG capsule Take 300 mg by mouth 3 (three) times daily.    Marland Kitchen HYDROcodone-acetaminophen (NORCO) 7.5-325 MG per tablet Take 1 tablet by mouth every 4 (four) hours as needed for moderate pain. Max APAP 3gm/24 hrs from all sources 60 tablet 0  . lisinopril-hydrochlorothiazide (PRINZIDE,ZESTORETIC) 20-12.5 MG per tablet Take 1 tablet by mouth daily.      Marland Kitchen LORazepam (ATIVAN) 1 MG tablet Take one tablet by mouth once daily for anxiety 30 tablet 5  . methocarbamol (ROBAXIN) 500 MG tablet Take 500 mg by mouth 4 (four) times daily.    . metoprolol tartrate (LOPRESSOR) 25 MG tablet Take 25 mg by mouth 2 (two) times daily. 1/2 TAB TWICE DAILY     . omeprazole (PRILOSEC) 20 MG capsule Take 1 capsule (20 mg total) by mouth daily. 31 capsule 11  . polyethylene glycol (MIRALAX / GLYCOLAX) packet Take 17 g by mouth daily. Takes every am or every other day    . potassium chloride SA (KLOR-CON M20) 20 MEQ tablet Take 20 mEq by mouth  daily.      . Probiotic Product (PROBIOTIC FORMULA) CAPS Take by mouth. ALIGN 1 PO DAILY    . torsemide (DEMADEX) 20 MG tablet Take 20 mg by mouth daily. Does reduction     No current facility-administered medications for this visit.      Physical Exam Physical Exam Blood pressure 155/81, height 5\' 9"  (1.753 m), weight 179 lb (81.194 kg).  Gen. appearance normal well-groomed The patient is alert and oriented person place and time Mood is normal affect is normal Ambulatory status associated with a cane  Exam of the right ankle  Inspection medial lateral incisions swelling of the foot and ankle ROM total arc of motion 40 Stability stable medial lateral anteroposterior Strength no atrophy  Skin: Skin incision  well-healed no erythema  Pulses: Normal dorsalis pedis pulses  Neuro: Decreased sensation lateral border of the foot   left knee tenderness over the iliotibial band Range of motion 125 of knee flexion No erythema No instability No weakness  L-spine tenderness right gluteal area lower back straight leg raise negative   Data Reviewed No new imaging  Assessment    Encounter Diagnoses  Name Primary?  . Right-sided low back pain with right-sided sciatica Yes  . S/P TKR (total knee replacement) using cement, left   . Ankle fracture, right, sequela         Plan    Recommend IM shot right hip steroid for overall decrease in inflammation Recommend topical medication over the left knee Follow-up 6 months x-ray left total knee for one year annual x-ray       Arther Abbott 11/17/2015, 11:10 AM   After obtaining consent, and per orders of Dr. Aline Brochure, injection of 40 mg Depo-Medrol given by . Patient instructed to remain in clinic for 20 minutes afterwards, and to report any adverse reaction to me immediately.

## 2015-11-17 NOTE — Patient Instructions (Signed)

## 2015-12-22 DIAGNOSIS — I10 Essential (primary) hypertension: Secondary | ICD-10-CM | POA: Diagnosis not present

## 2015-12-22 DIAGNOSIS — D649 Anemia, unspecified: Secondary | ICD-10-CM | POA: Diagnosis not present

## 2015-12-22 DIAGNOSIS — R600 Localized edema: Secondary | ICD-10-CM | POA: Diagnosis not present

## 2015-12-22 DIAGNOSIS — G629 Polyneuropathy, unspecified: Secondary | ICD-10-CM | POA: Diagnosis not present

## 2016-02-18 DIAGNOSIS — G4459 Other complicated headache syndrome: Secondary | ICD-10-CM | POA: Diagnosis not present

## 2016-02-18 DIAGNOSIS — G609 Hereditary and idiopathic neuropathy, unspecified: Secondary | ICD-10-CM | POA: Diagnosis not present

## 2016-02-18 DIAGNOSIS — M545 Low back pain: Secondary | ICD-10-CM | POA: Diagnosis not present

## 2016-02-18 DIAGNOSIS — I2510 Atherosclerotic heart disease of native coronary artery without angina pectoris: Secondary | ICD-10-CM | POA: Diagnosis not present

## 2016-02-18 DIAGNOSIS — I10 Essential (primary) hypertension: Secondary | ICD-10-CM | POA: Diagnosis not present

## 2016-02-18 DIAGNOSIS — K219 Gastro-esophageal reflux disease without esophagitis: Secondary | ICD-10-CM | POA: Diagnosis not present

## 2016-03-19 DIAGNOSIS — M545 Low back pain: Secondary | ICD-10-CM | POA: Diagnosis not present

## 2016-03-19 DIAGNOSIS — M170 Bilateral primary osteoarthritis of knee: Secondary | ICD-10-CM | POA: Diagnosis not present

## 2016-03-19 DIAGNOSIS — Z79891 Long term (current) use of opiate analgesic: Secondary | ICD-10-CM | POA: Diagnosis not present

## 2016-03-19 DIAGNOSIS — M160 Bilateral primary osteoarthritis of hip: Secondary | ICD-10-CM | POA: Diagnosis not present

## 2016-04-19 ENCOUNTER — Telehealth: Payer: Self-pay | Admitting: Orthopedic Surgery

## 2016-04-19 ENCOUNTER — Other Ambulatory Visit: Payer: Self-pay | Admitting: *Deleted

## 2016-04-19 MED ORDER — CEPHALEXIN 500 MG PO CAPS: ORAL_CAPSULE | ORAL | 2 refills | Status: DC

## 2016-04-19 NOTE — Telephone Encounter (Signed)
ANTIBIOTIC SENT, PATIENT AWARE

## 2016-04-19 NOTE — Telephone Encounter (Signed)
Patient is having some teeth pulled later this week. Her dentist told her to ask if she will need any antibiotics. She had knee replacement.  Please advise

## 2016-04-22 DIAGNOSIS — G609 Hereditary and idiopathic neuropathy, unspecified: Secondary | ICD-10-CM | POA: Diagnosis not present

## 2016-04-22 DIAGNOSIS — K219 Gastro-esophageal reflux disease without esophagitis: Secondary | ICD-10-CM | POA: Diagnosis not present

## 2016-04-22 DIAGNOSIS — I2510 Atherosclerotic heart disease of native coronary artery without angina pectoris: Secondary | ICD-10-CM | POA: Diagnosis not present

## 2016-04-22 DIAGNOSIS — M545 Low back pain: Secondary | ICD-10-CM | POA: Diagnosis not present

## 2016-04-22 DIAGNOSIS — G4459 Other complicated headache syndrome: Secondary | ICD-10-CM | POA: Diagnosis not present

## 2016-04-22 DIAGNOSIS — I10 Essential (primary) hypertension: Secondary | ICD-10-CM | POA: Diagnosis not present

## 2016-05-14 ENCOUNTER — Emergency Department (HOSPITAL_COMMUNITY)
Admission: EM | Admit: 2016-05-14 | Discharge: 2016-05-14 | Disposition: A | Discharge status: Home / Self Care | Payer: PPO | Attending: Emergency Medicine | Admitting: Emergency Medicine

## 2016-05-14 ENCOUNTER — Encounter (HOSPITAL_COMMUNITY): Payer: Self-pay | Admitting: Emergency Medicine

## 2016-05-14 DIAGNOSIS — R32 Unspecified urinary incontinence: Secondary | ICD-10-CM | POA: Insufficient documentation

## 2016-05-14 DIAGNOSIS — Z7982 Long term (current) use of aspirin: Secondary | ICD-10-CM | POA: Diagnosis not present

## 2016-05-14 DIAGNOSIS — Z791 Long term (current) use of non-steroidal anti-inflammatories (NSAID): Secondary | ICD-10-CM | POA: Insufficient documentation

## 2016-05-14 DIAGNOSIS — I10 Essential (primary) hypertension: Secondary | ICD-10-CM | POA: Diagnosis not present

## 2016-05-14 DIAGNOSIS — R42 Dizziness and giddiness: Secondary | ICD-10-CM | POA: Diagnosis not present

## 2016-05-14 DIAGNOSIS — Z79899 Other long term (current) drug therapy: Secondary | ICD-10-CM | POA: Diagnosis not present

## 2016-05-14 LAB — BASIC METABOLIC PANEL
Anion gap: 2 — ABNORMAL LOW (ref 5–15)
BUN: 12 mg/dL (ref 6–20)
CALCIUM: 8.4 mg/dL — AB (ref 8.9–10.3)
CO2: 27 mmol/L (ref 22–32)
Chloride: 104 mmol/L (ref 101–111)
Creatinine, Ser: 0.99 mg/dL (ref 0.44–1.00)
GFR calc Af Amer: >60 mL/min (ref >60)
GFR, EST NON AFRICAN AMERICAN: 53 mL/min — AB (ref >60)
GLUCOSE: 104 mg/dL — AB (ref 65–99)
Potassium: 3.5 mmol/L (ref 3.5–5.1)
SODIUM: 133 mmol/L — AB (ref 135–145)

## 2016-05-14 LAB — CBC
HCT: 38.4 % (ref 36.0–46.0)
Hemoglobin: 12.4 g/dL (ref 12.0–15.0)
MCH: 23.5 pg — AB (ref 26.0–34.0)
MCHC: 32.3 g/dL (ref 30.0–36.0)
MCV: 72.9 fL — AB (ref 78.0–100.0)
PLATELETS: 178 10*3/uL (ref 150–400)
RBC: 5.27 MIL/uL — ABNORMAL HIGH (ref 3.87–5.11)
RDW: 14.9 % (ref 11.5–15.5)
WBC: 9.4 10*3/uL (ref 4.0–10.5)

## 2016-05-14 LAB — URINALYSIS, ROUTINE W REFLEX MICROSCOPIC
BILIRUBIN URINE: NEGATIVE
Glucose, UA: NEGATIVE mg/dL
KETONES UR: NEGATIVE mg/dL
Leukocytes, UA: NEGATIVE
Nitrite: NEGATIVE
PH: 6 (ref 5.0–8.0)
Protein, ur: NEGATIVE mg/dL
SPECIFIC GRAVITY, URINE: 1.01 (ref 1.005–1.030)

## 2016-05-14 LAB — URINE MICROSCOPIC-ADD ON

## 2016-05-14 MED ORDER — SODIUM CHLORIDE 0.9 % IV BOLUS (SEPSIS)
500.0000 mL | Freq: Once | INTRAVENOUS | Status: AC
  Administered 2016-05-14: 500 mL via INTRAVENOUS

## 2016-05-14 MED ORDER — MECLIZINE HCL 12.5 MG PO TABS
12.5000 mg | ORAL_TABLET | Freq: Once | ORAL | Status: AC
  Administered 2016-05-14: 12.5 mg via ORAL
  Filled 2016-05-14: qty 1

## 2016-05-14 MED ORDER — MECLIZINE HCL 12.5 MG PO TABS: 12.5000 mg | ORAL_TABLET | Freq: Three times a day (TID) | ORAL | 0 refills | Status: DC | PRN

## 2016-05-14 NOTE — ED Triage Notes (Signed)
PT c/o dizziness with HTN since she woke up yesterday morning at 0830. PT denies any CP or SOB. PT states she has been on the same HTN medications for a long period of time with no missed doses.

## 2016-05-14 NOTE — ED Provider Notes (Signed)
[place] DEPT Provider Note   CSN: [id number] Arrival date & time: 05/14/16  1746     History   Chief Complaint Chief Complaint  Patient presents with  . Dizziness    HPI Katherine Joseph is a 81 y.o. female.  Patient presents today with dizziness for 2 days. Patient had a tooth extraction done yesterday and noticed her blood pressure was elevated to 210/100 at her dentist's office. Dizziness started prior to her appointment. She noticed every time she stood up to do anything around the house she became dizzy and had to sit back down. She reports a good appetite, but has experienced some decreased po intake over the last day due to dental pain and swelling. She reports compliance with all of her home medication however stopped taking her diuretic pill about one month ago due to worsening episodes of incontinence. Otherwise denies any missed doses. She denies dysuria or pain with urination. No flank pain. Patient denies fevers at home. She does endorse a generalized headache that started today. She denies vision changes, slurred speech, facial droop, difficulty with gait or weakness. She did endorse diffuse, transient paresthesias in her bilateral hands and feet that she described as "feeling funny", but currently denies numbness.      Past Medical History:  Diagnosis Date  . Fluttering muscles SEP 2009   MBS/BASW NL  . GERD (gastroesophageal reflux disease)   . Hemorrhoids, internal OCT 2011  . Hyperlipidemia   . Hypertension   . IBS (irritable bowel syndrome)     Patient Active Problem List   Diagnosis Date Noted  . Total knee replacement status 05/21/2015  . Arthritis of knee, degenerative 05/21/2015  . Arthritis of left knee   . Osteoarthritis of right knee 02/14/2014  . Hip pain, chronic 05/16/2013  . Right knee pain 05/16/2013  . Radicular leg pain 05/16/2013  . Scoliosis of lumbar spine 05/16/2013  . Colon cancer screening 01/13/2011  . Microcytic anemia  09/03/2010  . WEIGHT LOSS 06/02/2010  . HYPERLIPIDEMIA 07/01/2009  . Essential hypertension 07/01/2009  . GERD 12/04/2008  . IBS 12/04/2008    Past Surgical History:  Procedure Laterality Date  . COLONOSCOPY  2004   RMR/NUR INFLAMMATORY POLYP, POST POLYPECTOMY BLEED  . COLONOSCOPY  oct 2011 SLF nvd-WEIGHT LOSS   NL TI, [place]/DC TICS  . ESOPHAGOGASTRODUODENOSCOPY  2006   W/DILATION W/RMR  . KNEE ARTHROSCOPY    . RIGHT FOOT SURGERY    . TOTAL KNEE ARTHROPLASTY Left 05/21/2015   Procedure: TOTAL KNEE ARTHROPLASTY;  Surgeon: Carole Civil, MD;  Location: [facility];  Service: Orthopedics;  Laterality: Left;    OB History    Gravida Para Term Preterm AB Living             8   SAB TAB Ectopic Multiple Live Births                   Home Medications    Prior to Admission medications   Medication Sig Start Date End Date Taking? Authorizing Provider  aspirin 81 MG tablet Take 81 mg by mouth every morning.    Yes Historical Provider, MD  atorvastatin (LIPITOR) 20 MG tablet Take 20 mg by mouth every evening.    Yes Historical Provider, MD  cephALEXin (KEFLEX) 500 MG capsule 4 CAPS BY MOUTH 30 MINUTES PRIOR TO DENTAL PROCEDURE 04/19/16  Yes Carole Civil, MD  divalproex (DEPAKOTE) 250 MG 24 hr tablet Take 750 mg by mouth at bedtime.  Yes Historical Provider, MD  DULoxetine (CYMBALTA) 30 MG capsule Take 60 mg by mouth daily. 05/12/16  Yes Historical Provider, MD  HYDROcodone-acetaminophen (NORCO) 7.5-325 MG per tablet Take 1 tablet by mouth every 4 (four) hours as needed for moderate pain. Max APAP 3gm/24 hrs from all sources Patient taking differently: Take 1 tablet by mouth 3 (three) times daily. Max APAP 3gm/24 hrs from all sources 06/03/15  Yes Estill Dooms, MD  ibuprofen (ADVIL,MOTRIN) 800 MG tablet  05/13/16  Yes Historical Provider, MD  lisinopril-hydrochlorothiazide (PRINZIDE,ZESTORETIC) 20-12.5 MG per tablet Take 1 tablet by mouth daily.     Yes Historical Provider, MD    metoprolol tartrate (LOPRESSOR) 25 MG tablet Take 12.5 mg by mouth 2 (two) times daily.    Yes Historical Provider, MD  omeprazole (PRILOSEC) 20 MG capsule Take 1 capsule (20 mg total) by mouth daily. 09/10/15  Yes Danie Binder, MD  penicillin v potassium (VEETID) 500 MG tablet Take 500 mg by mouth 4 (four) times daily. 7 day course started on 05/13/2016 05/13/16  Yes Historical Provider, MD  potassium chloride SA (KLOR-CON M20) 20 MEQ tablet Take 20 mEq by mouth daily.     Yes Historical Provider, MD  torsemide (DEMADEX) 20 MG tablet Take 20 mg by mouth daily. Does reduction   Yes Historical Provider, MD  gabapentin (NEURONTIN) 300 MG capsule Take 300 mg by mouth 3 (three) times daily.    Historical Provider, MD  meclizine (ANTIVERT) 12.5 MG tablet Take 1 tablet (12.5 mg total) by mouth 3 (three) times daily as needed. 05/14/16   Velna Ochs, MD  polyethylene glycol [facility] / Floria Raveling) packet Take 17 g by mouth daily as needed for mild constipation or moderate constipation. Takes every am or every other day     Historical Provider, MD    Family History Family History  Problem Relation Age of Onset  . Alzheimer's disease Mother   . Dementia Mother   . Cancer Father     stomach  . Alcohol abuse Brother   . Colon polyps Neg Hx   . Colon cancer Neg Hx     Social History Social History  Substance Use Topics  . Smoking status: Never Smoker  . Smokeless tobacco: Never Used  . Alcohol use No     Allergies   Sulfa antibiotics and Iodinated diagnostic agents   Review of Systems Review of Systems  Constitutional: Negative.   Eyes: Negative.   Respiratory: Negative.   Cardiovascular: Negative.   Gastrointestinal: Negative.   Endocrine: Negative.   Genitourinary:       Episodes of incontinence   Musculoskeletal: Negative.   Skin: Negative.   Allergic/Immunologic: Negative.   Neurological: Positive for dizziness, light-headedness and headaches. Negative for syncope and  weakness.  Hematological: Negative.   Psychiatric/Behavioral: Negative.      Physical Exam Updated Vital Signs BP 199/86 (BP Location: Right Arm)   Pulse 75   Temp 99 F (37.2 C) (Oral)   Resp 14   Ht 5\' 9"  (1.753 m)   Wt 77.1 kg   SpO2 97%   BMI 25.10 kg/m   Physical Exam  Constitutional: She is oriented to person, place, and time. She appears well-developed and well-nourished. No distress.  HENT:  Head: Normocephalic and atraumatic.  Eyes: Conjunctivae are normal.  Neck: Neck supple.  Cardiovascular: Normal rate, regular rhythm and normal heart sounds.   No murmur heard. Pulmonary/Chest: Effort normal and breath sounds normal. No respiratory distress.  Abdominal: Soft.  Bowel sounds are normal. There is no tenderness.  Musculoskeletal: She exhibits no edema.  Neurological: She is alert and oriented to person, place, and time. She has normal reflexes. She displays normal reflexes. No cranial nerve deficit. Coordination normal.  Skin: Skin is warm and dry.  Psychiatric: She has a normal mood and affect.  Nursing note and vitals reviewed.    ED Treatments / Results  Labs (all labs ordered are listed, but only abnormal results are displayed) Labs Reviewed  URINALYSIS, ROUTINE W REFLEX MICROSCOPIC (NOT AT [facility]) - Abnormal; Notable for the following:       Result Value   Hgb urine dipstick SMALL (*)    All other components within normal limits  CBC - Abnormal; Notable for the following:    RBC 5.27 (*)    MCV 72.9 (*)    MCH 23.5 (*)    All other components within normal limits  BASIC METABOLIC PANEL - Abnormal; Notable for the following:    Sodium 133 (*)    Glucose, Bld 104 (*)    Calcium 8.4 (*)    GFR calc non Af Amer 53 (*)    Anion gap 2 (*)    All other components within normal limits  URINE MICROSCOPIC-ADD ON - Abnormal; Notable for the following:    Squamous Epithelial / LPF 0-5 (*)    Bacteria, UA RARE (*)    All other components within normal limits    URINE CULTURE    EKG  EKG Interpretation None       Radiology No results found.  Procedures Procedures (including critical care time)  Medications Ordered in ED Medications  sodium chloride 0.9 % bolus 500 mL (0 mLs Intravenous Stopped 05/14/16 2030)  meclizine (ANTIVERT) tablet 12.5 mg (12.5 mg Oral Given 05/14/16 2200)     Initial Impression / Assessment and Plan / ED Course  I have reviewed the triage vital signs and the nursing notes.  Pertinent labs & imaging results that were available during my care of the patient were reviewed by me and considered in my medical decision making (see chart for details).  Clinical Course   Dizziness: UA with no signs of infection. CBC and BMP unremarkable. Neuro exam normal, no focal deficits. Normal gait. Orthostatic vitals positive - given 1L NS bolus and meclizine 12.5 mg. Sent home with meclizine prn. Given return precautions for persistent or worsening dizziness. Discussed imaging if dizziness persists. F/u with PCP.   Final Clinical Impressions(s) / ED Diagnoses   Final diagnoses:  Dizziness    New Prescriptions Discharge Medication List as of 05/14/2016  9:37 PM    START taking these medications   Details  meclizine (ANTIVERT) 12.5 MG tablet Take 1 tablet (12.5 mg total) by mouth 3 (three) times daily as needed., Starting Fri 05/14/2016, Normal         Velna Ochs, MD 05/16/16 [id number]    Elnora Morrison, MD 05/18/16 [phone]

## 2016-05-14 NOTE — Discharge Instructions (Signed)
Please take meclizine three time daily as needed for dizziness. If dizziness persists or worsens, please see your PCP on Monday or return to the ER for further evaluation.

## 2016-05-16 LAB — URINE CULTURE: Culture: NO GROWTH

## 2016-05-17 ENCOUNTER — Ambulatory Visit (INDEPENDENT_AMBULATORY_CARE_PROVIDER_SITE_OTHER): Payer: PPO | Admitting: Orthopedic Surgery

## 2016-05-17 ENCOUNTER — Ambulatory Visit (INDEPENDENT_AMBULATORY_CARE_PROVIDER_SITE_OTHER): Payer: PPO

## 2016-05-17 VITALS — BP 168/91 | HR 80 | Ht 69.0 in | Wt 181.0 lb

## 2016-05-17 DIAGNOSIS — M1710 Unilateral primary osteoarthritis, unspecified knee: Secondary | ICD-10-CM

## 2016-05-17 DIAGNOSIS — M129 Arthropathy, unspecified: Secondary | ICD-10-CM

## 2016-05-17 DIAGNOSIS — Z96652 Presence of left artificial knee joint: Secondary | ICD-10-CM | POA: Diagnosis not present

## 2016-05-17 NOTE — Progress Notes (Signed)
Patient ID: Katherine Joseph, female   DOB: May 05, 1935, 81 y.o.   MRN: [id number]  Post op annual TKA   Chief Complaint  Patient presents with  . Follow-up    6 month recheck on left knee replacement with xray. DOS 05-21-15.    HPI Katherine Joseph is a 81 y.o. female.  One year status post left total knee Sigma posterior stabilized [place] fixed-bearing no complaints   Past Medical History:  Diagnosis Date  . Fluttering muscles SEP 2009   MBS/BASW NL  . GERD (gastroesophageal reflux disease)   . Hemorrhoids, internal OCT 2011  . Hyperlipidemia   . Hypertension   . IBS (irritable bowel syndrome)     Past Surgical History:  Procedure Laterality Date  . COLONOSCOPY  2004   RMR/NUR INFLAMMATORY POLYP, POST POLYPECTOMY BLEED  . COLONOSCOPY  oct 2011 SLF nvd-WEIGHT LOSS   NL TI, [place]/DC TICS  . ESOPHAGOGASTRODUODENOSCOPY  2006   W/DILATION W/RMR  . KNEE ARTHROSCOPY    . RIGHT FOOT SURGERY    . TOTAL KNEE ARTHROPLASTY Left 05/21/2015   Procedure: TOTAL KNEE ARTHROPLASTY;  Surgeon: Carole Civil, MD;  Location: [facility];  Service: Orthopedics;  Laterality: Left;     Allergies  Allergen Reactions  . Sulfa Antibiotics Hives  . Iodinated Diagnostic Agents Rash    Current Outpatient Prescriptions  Medication Sig Dispense Refill  . aspirin 81 MG tablet Take 81 mg by mouth every morning.     Marland Kitchen atorvastatin (LIPITOR) 20 MG tablet Take 20 mg by mouth every evening.     . cephALEXin (KEFLEX) 500 MG capsule 4 CAPS BY MOUTH 30 MINUTES PRIOR TO DENTAL PROCEDURE 4 capsule 2  . divalproex (DEPAKOTE) 250 MG 24 hr tablet Take 750 mg by mouth at bedtime.     . DULoxetine (CYMBALTA) 30 MG capsule Take 60 mg by mouth daily.    Marland Kitchen gabapentin (NEURONTIN) 300 MG capsule Take 300 mg by mouth 3 (three) times daily.    Marland Kitchen HYDROcodone-acetaminophen (NORCO) 7.5-325 MG per tablet Take 1 tablet by mouth every 4 (four) hours as needed for moderate pain. Max APAP 3gm/24 hrs from all sources (Patient taking  differently: Take 1 tablet by mouth 3 (three) times daily. Max APAP 3gm/24 hrs from all sources) 60 tablet 0  . ibuprofen (ADVIL,MOTRIN) 800 MG tablet     . lisinopril-hydrochlorothiazide (PRINZIDE,ZESTORETIC) 20-12.5 MG per tablet Take 1 tablet by mouth daily.      . meclizine (ANTIVERT) 12.5 MG tablet Take 1 tablet (12.5 mg total) by mouth 3 (three) times daily as needed. 30 tablet 0  . metoprolol tartrate (LOPRESSOR) 25 MG tablet Take 12.5 mg by mouth 2 (two) times daily.     Marland Kitchen omeprazole (PRILOSEC) 20 MG capsule Take 1 capsule (20 mg total) by mouth daily. 31 capsule 11  . penicillin v potassium (VEETID) 500 MG tablet Take 500 mg by mouth 4 (four) times daily. 7 day course started on 05/13/2016    . polyethylene glycol (MIRALAX / GLYCOLAX) packet Take 17 g by mouth daily as needed for mild constipation or moderate constipation. Takes every am or every other day     . potassium chloride SA (KLOR-CON M20) 20 MEQ tablet Take 20 mEq by mouth daily.      Marland Kitchen torsemide (DEMADEX) 20 MG tablet Take 20 mg by mouth daily. Does reduction     No current facility-administered medications for this visit.     Review of Systems Review of Systems  None musculoskeletal Physical Exam Blood pressure (!) 168/91, pulse 80, height 5\' 9"  (1.753 m), weight 181 lb (82.1 kg).   Gen. appearance is normal there are no congenital abnormalities   The patient is oriented 3   Mood and affect are normal   Ambulation is with assistive device, unrelated to the tka   Knee inspection reveals a well-healed incision with no swelling   Knee flexion measured 125 with goniometer less than 5 flexion contracture  Stability in the anteroposterior plane is normal as well as in the medial lateral plane  Motor exam reveals full extension without extensor lag   Data Reviewed KNEE XRAYS : Stable total knee normal alignment   Assessment S/P left TKA   Plan    Fu left knee tka 1 year        Arther Abbott 05/17/2016, 10:41 AM

## 2016-05-18 DIAGNOSIS — I10 Essential (primary) hypertension: Secondary | ICD-10-CM | POA: Diagnosis not present

## 2016-05-27 DIAGNOSIS — I10 Essential (primary) hypertension: Secondary | ICD-10-CM | POA: Diagnosis not present

## 2016-06-16 DIAGNOSIS — I10 Essential (primary) hypertension: Secondary | ICD-10-CM | POA: Diagnosis not present

## 2016-06-16 DIAGNOSIS — R51 Headache: Secondary | ICD-10-CM | POA: Diagnosis not present

## 2016-06-16 DIAGNOSIS — M170 Bilateral primary osteoarthritis of knee: Secondary | ICD-10-CM | POA: Diagnosis not present

## 2016-06-16 DIAGNOSIS — Z79891 Long term (current) use of opiate analgesic: Secondary | ICD-10-CM | POA: Diagnosis not present

## 2016-07-26 ENCOUNTER — Encounter: Payer: Self-pay | Admitting: Gastroenterology

## 2016-07-28 DIAGNOSIS — G609 Hereditary and idiopathic neuropathy, unspecified: Secondary | ICD-10-CM | POA: Diagnosis not present

## 2016-07-28 DIAGNOSIS — K219 Gastro-esophageal reflux disease without esophagitis: Secondary | ICD-10-CM | POA: Diagnosis not present

## 2016-07-28 DIAGNOSIS — I951 Orthostatic hypotension: Secondary | ICD-10-CM | POA: Diagnosis not present

## 2016-07-28 DIAGNOSIS — G4459 Other complicated headache syndrome: Secondary | ICD-10-CM | POA: Diagnosis not present

## 2016-07-28 DIAGNOSIS — I2510 Atherosclerotic heart disease of native coronary artery without angina pectoris: Secondary | ICD-10-CM | POA: Diagnosis not present

## 2016-07-28 DIAGNOSIS — I10 Essential (primary) hypertension: Secondary | ICD-10-CM | POA: Diagnosis not present

## 2016-07-28 DIAGNOSIS — M545 Low back pain: Secondary | ICD-10-CM | POA: Diagnosis not present

## 2016-07-28 DIAGNOSIS — H9311 Tinnitus, right ear: Secondary | ICD-10-CM | POA: Diagnosis not present

## 2016-07-28 DIAGNOSIS — G3184 Mild cognitive impairment, so stated: Secondary | ICD-10-CM | POA: Diagnosis not present

## 2016-08-02 ENCOUNTER — Other Ambulatory Visit (HOSPITAL_COMMUNITY): Payer: Self-pay | Admitting: Family Medicine

## 2016-08-02 DIAGNOSIS — Z1231 Encounter for screening mammogram for malignant neoplasm of breast: Secondary | ICD-10-CM

## 2016-08-23 DIAGNOSIS — H353110 Nonexudative age-related macular degeneration, right eye, stage unspecified: Secondary | ICD-10-CM | POA: Diagnosis not present

## 2016-09-01 ENCOUNTER — Ambulatory Visit (HOSPITAL_COMMUNITY)
Admission: RE | Admit: 2016-09-01 | Discharge: 2016-09-01 | Disposition: A | Discharge status: Home / Self Care | Payer: PPO | Source: Ambulatory Visit | Attending: Family Medicine | Admitting: Family Medicine

## 2016-09-01 DIAGNOSIS — Z1231 Encounter for screening mammogram for malignant neoplasm of breast: Secondary | ICD-10-CM | POA: Diagnosis not present

## 2016-09-07 DIAGNOSIS — G629 Polyneuropathy, unspecified: Secondary | ICD-10-CM | POA: Diagnosis not present

## 2016-09-07 DIAGNOSIS — M545 Low back pain: Secondary | ICD-10-CM | POA: Diagnosis not present

## 2016-09-07 DIAGNOSIS — Z79891 Long term (current) use of opiate analgesic: Secondary | ICD-10-CM | POA: Diagnosis not present

## 2016-09-07 DIAGNOSIS — M170 Bilateral primary osteoarthritis of knee: Secondary | ICD-10-CM | POA: Diagnosis not present

## 2016-09-08 ENCOUNTER — Ambulatory Visit (INDEPENDENT_AMBULATORY_CARE_PROVIDER_SITE_OTHER): Payer: PPO | Admitting: Gastroenterology

## 2016-09-08 ENCOUNTER — Encounter: Payer: Self-pay | Admitting: Gastroenterology

## 2016-09-08 DIAGNOSIS — R634 Abnormal weight loss: Secondary | ICD-10-CM | POA: Diagnosis not present

## 2016-09-08 DIAGNOSIS — K581 Irritable bowel syndrome with constipation: Secondary | ICD-10-CM | POA: Diagnosis not present

## 2016-09-08 DIAGNOSIS — D509 Iron deficiency anemia, unspecified: Secondary | ICD-10-CM | POA: Diagnosis not present

## 2016-09-08 DIAGNOSIS — K219 Gastro-esophageal reflux disease without esophagitis: Secondary | ICD-10-CM

## 2016-09-08 NOTE — Assessment & Plan Note (Signed)
WEIGHT STABLE.  CONTINUE TO MONITOR SYMPTOMS. 

## 2016-09-08 NOTE — Assessment & Plan Note (Signed)
HB 12.23 Apr 2016. NO BRBPR OR MELENA.  CONTINUE TO MONITOR SYMPTOMS.

## 2016-09-08 NOTE — Progress Notes (Signed)
Subjective:    Patient ID: Katherine Joseph, female    DOB: [birth year], 81 y.o.   MRN: [id number]  Robert Bellow, MD   HPI After bm in the AM. AFTERWARDS SHE GETS HOT AND SWEATS ALL THE MORNING. GOING ON FOR PAST YEAR. APPETITE: OK. WEIGHT STABLE. USE MIRALAX IF NEEDED FOR CONSTIPATION.  EARLY IN AM URINE IS DARK BROWN. Trouble with aching in knees, legs,and hips  PT DENIES FEVER, CHILLS, HEMATOCHEZIA, nausea, vomiting, melena, diarrhea, CHEST PAIN, SHORTNESS OF BREATH,  CHANGE IN BOWEL IN HABITS, abdominal pain, problems swallowing, OR heartburn or indigestion.  Past Medical History:  Diagnosis Date  . Fluttering muscles SEP 2009   MBS/BASW NL  . GERD (gastroesophageal reflux disease)   . Hemorrhoids, internal OCT 2011  . Hyperlipidemia   . Hypertension   . IBS (irritable bowel syndrome)    Past Surgical History:  Procedure Laterality Date  . COLONOSCOPY  2004   RMR/NUR INFLAMMATORY POLYP, POST POLYPECTOMY BLEED  . COLONOSCOPY  oct 2011 SLF nvd-WEIGHT LOSS   NL TI, [place]/DC TICS  . ESOPHAGOGASTRODUODENOSCOPY  2006   W/DILATION W/RMR  . KNEE ARTHROSCOPY    . RIGHT FOOT SURGERY    . TOTAL KNEE ARTHROPLASTY Left 05/21/2015   Procedure: TOTAL KNEE ARTHROPLASTY;  Surgeon: Carole Civil, MD;  Location: [facility];  Service: Orthopedics;  Laterality: Left;   Allergies  Allergen Reactions  . Sulfa Antibiotics Hives  . Iodinated Diagnostic Agents Rash   Current Outpatient Prescriptions  Medication Sig Dispense Refill  . aspirin 81 MG tablet Take 81 mg by mouth every morning.     Marland Kitchen atorvastatin (LIPITOR) 20 MG tablet Take 20 mg by mouth every evening.     . divalproex (DEPAKOTE) 250 MG 24 hr tablet Take 750 mg by mouth at bedtime.     . DULoxetine (CYMBALTA) 30 MG capsule Take 60 mg by mouth daily.    Marland Kitchen gabapentin (NEURONTIN) 300 MG capsule Take 300 mg by mouth 3 (three) times daily.    Marland Kitchen HYDROcodone-acetaminophen (NORCO) 7.5-325 MG per tablet Take 1 tablet by mouth every 4  (four) hours as needed for moderate pain. Max APAP 3gm/24 hrs from all sources (Patient taking differently: Take 1 tablet by mouth 3 (three) times daily. Max APAP 3gm/24 hrs from all sources)    . lisinopril-hydrochlorothiazide (PRINZIDE,ZESTORETIC) 20-12.5 MG per tablet Take 1 tablet by mouth daily.      . metoprolol tartrate (LOPRESSOR) 25 MG tablet Take 12.5 mg by mouth 2 (two) times daily.     Marland Kitchen omeprazole (PRILOSEC) 20 MG capsule Take 1 capsule (20 mg total) by mouth daily.    . potassium chloride SA (KLOR-CON M20) 20 MEQ tablet Take 20 mEq by mouth daily.      Marland Kitchen torsemide (DEMADEX) 20 MG tablet Take 20 mg by mouth daily.QOD    .      .      .      .      .        Review of Systems PER HPI OTHERWISE ALL SYSTEMS ARE NEGATIVE.    Objective:   Physical Exam  Constitutional: She is oriented to person, place, and time. She appears well-developed and well-nourished. No distress.  HENT:  Head: Normocephalic and atraumatic.  Mouth/Throat: Oropharynx is clear and moist. No oropharyngeal exudate.  Eyes: Pupils are equal, round, and reactive to light. No scleral icterus.  Neck: Normal range of motion. Neck supple.  Cardiovascular: Normal rate,  regular rhythm and normal heart sounds.   Pulmonary/Chest: Effort normal and breath sounds normal. No respiratory distress.  Abdominal: Soft. Bowel sounds are normal. She exhibits no distension. There is no tenderness.  Musculoskeletal: She exhibits no edema.  Lymphadenopathy:    She has no cervical adenopathy.  Neurological: She is alert and oriented to person, place, and time.  NO  NEW FOCAL DEFICITS  Psychiatric:  FLAT AFFECT, SLIGHTLY ANXIOUS MOOD  Vitals reviewed.     Assessment & Plan:

## 2016-09-08 NOTE — Assessment & Plan Note (Signed)
SYMPTOMS FAIRLY WELL CONTROLLED.  Use OMEPRAZOLE 30 minutes prior to your first meal.  FOLLOW UP IN 1 YEAR.

## 2016-09-08 NOTE — Progress Notes (Signed)
ON RECALL  °

## 2016-09-08 NOTE — Patient Instructions (Addendum)
TAKE ALIGN OR RESTORA DAILY.  USE MIRALAX IF NEEDED TO PREVENT OCNMSTIPATION.  Use OMEPRAZOLE 30 minutes prior to your first meal.  FOLLOW UP IN 1 YEAR.

## 2016-09-08 NOTE — Assessment & Plan Note (Signed)
SYMPTOMS FAIRLY WELL CONTROLLED. NO WARNING SIGNS/SYMPTOMS  TAKE ALIGN OR RESTORA DAILY. USE MIRALAX IF NEEDED TO PREVENT OCNMSTIPATION. Use OMEPRAZOLE 30 minutes prior to your first meal.  FOLLOW UP IN 1 YEAR.

## 2016-09-09 NOTE — Progress Notes (Signed)
CC'D TO PCP °

## 2016-10-04 ENCOUNTER — Telehealth: Payer: Self-pay | Admitting: Neurology

## 2016-10-04 ENCOUNTER — Ambulatory Visit (INDEPENDENT_AMBULATORY_CARE_PROVIDER_SITE_OTHER): Payer: PPO | Admitting: Neurology

## 2016-10-04 ENCOUNTER — Encounter: Payer: Self-pay | Admitting: Neurology

## 2016-10-04 VITALS — BP 111/61 | HR 70 | Ht 69.0 in | Wt 187.5 lb

## 2016-10-04 DIAGNOSIS — G4489 Other headache syndrome: Secondary | ICD-10-CM

## 2016-10-04 DIAGNOSIS — G609 Hereditary and idiopathic neuropathy, unspecified: Secondary | ICD-10-CM | POA: Diagnosis not present

## 2016-10-04 DIAGNOSIS — H9190 Unspecified hearing loss, unspecified ear: Secondary | ICD-10-CM

## 2016-10-04 DIAGNOSIS — H905 Unspecified sensorineural hearing loss: Secondary | ICD-10-CM | POA: Diagnosis not present

## 2016-10-04 HISTORY — DX: Hereditary and idiopathic neuropathy, unspecified: G60.9

## 2016-10-04 NOTE — Patient Instructions (Signed)
We will get MRI of the brain and get a carotid doppler study. 

## 2016-10-04 NOTE — Telephone Encounter (Signed)
Called and left message for patient to call me back to schedule her carotid Doppler.

## 2016-10-04 NOTE — Progress Notes (Signed)
Reason for visit: Headache, positional tinnitus  Referring physician: Dr. Arther Dames Katherine Joseph is a 82 y.o. female  History of present illness:  Katherine Joseph is a 82 year old right-handed black female with a long-standing history of left supraorbital headaches that began sometime around 2012. The patient was seen with Dr. Lily Lovings around that time, MRI of the brain was ordered and did not show any significant intracranial disease that would explain headaches. 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 The patient continues to have this same headache, but the patient indicates that the discomfort is not severe. The patient generally will have some discomfort in the evening before she goes to bed, the pain does not keep her awake, and does not wake her up at night. She does not have pain throughout the day. The patient also has what sounds like a peripheral neuropathy with some paresthesias and burning sensations in the feet, the patient is on gabapentin for this issue. The patient denies any significant issues with balance or multiple falls. Her main concern today appears to be associated with a six-month history of a buzzing noise in the right ear that comes on when she turns her head to the left, and goes away when she turns her head to the right. The patient comes to this office for an evaluation. The patient denies any weakness or numbness of the extremities, she does have some low back pain with standing at times. She denies any dizziness, visual changes, severe balance problems, or difficulty controlling the bowels or the bladder.   Past Medical History:  Diagnosis Date  . Fluttering muscles SEP 2009   MBS/BASW NL  . GERD (gastroesophageal reflux disease)   . Hemorrhoids, internal OCT 2011  . Hereditary and idiopathic peripheral neuropathy  10/04/2016  . Hyperlipidemia   . Hypertension   . IBS (irritable bowel syndrome)     Past Surgical History:  Procedure Laterality Date  . COLONOSCOPY  2004   RMR/NUR INFLAMMATORY POLYP, POST POLYPECTOMY BLEED  . COLONOSCOPY  oct 2011 SLF nvd-WEIGHT LOSS   NL TI, [place]/DC TICS  . ESOPHAGOGASTRODUODENOSCOPY  2006   W/DILATION W/RMR  . KNEE ARTHROSCOPY    . RIGHT FOOT SURGERY    . TOTAL KNEE ARTHROPLASTY Left 05/21/2015   Procedure: TOTAL KNEE ARTHROPLASTY;  Surgeon: Carole Civil, MD;  Location: [facility];  Service: Orthopedics;  Laterality: Left;    Family History  Problem Relation Age of Onset  . Alzheimer's disease Mother   . Dementia Mother   . Cancer Father     stomach  . Alcohol abuse Brother   . Colon polyps Neg Hx   . Colon cancer Neg Hx     Social history:  reports that she has never smoked. Her smokeless tobacco use includes Snuff. She reports that she does not drink alcohol or use drugs.  Medications:  Prior to Admission medications   Medication Sig Start Date End Date Taking? Authorizing Provider  aspirin 81 MG tablet Take 81 mg by mouth every morning.    Yes Historical Provider, MD  atorvastatin (LIPITOR) 20 MG tablet Take 20 mg by mouth every evening.    Yes Historical Provider, MD  divalproex (DEPAKOTE) 250 MG 24 hr tablet Take 750 mg by mouth at bedtime.    Yes Historical Provider, MD  divalproex (DEPAKOTE) 250 MG  DR tablet  09/14/16  Yes Historical Provider, MD  DULoxetine (CYMBALTA) 30 MG capsule Take 60 mg by mouth daily. 05/12/16  Yes Historical Provider, MD  gabapentin (NEURONTIN) 300 MG capsule Take 300 mg by mouth 3 (three) times daily.   Yes Historical Provider, MD  HYDROcodone-acetaminophen [facility]) 10-325 MG tablet  09/21/16  Yes Historical Provider, MD  lisinopril-hydrochlorothiazide (PRINZIDE,ZESTORETIC) 20-25 MG tablet  09/21/16  Yes Historical Provider, MD  metoprolol tartrate (LOPRESSOR) 25 MG tablet Take 12.5 mg by mouth 2 (two) times daily.    Yes  Historical Provider, MD  omeprazole (PRILOSEC) 20 MG capsule Take 1 capsule (20 mg total) by mouth daily. 09/10/15  Yes Danie Binder, MD  potassium chloride SA (KLOR-CON M20) 20 MEQ tablet Take 20 mEq by mouth daily.     Yes Historical Provider, MD  torsemide (DEMADEX) 20 MG tablet Take 20 mg by mouth daily. Does reduction   Yes Historical Provider, MD      Allergies  Allergen Reactions  . Sulfa Antibiotics Hives  . Iodinated Diagnostic Agents Rash    ROS:  Out of a complete 14 system review of symptoms, the patient complains only of the following symptoms, and all other reviewed systems are negative.  Swelling in the legs Ringing in the ears Blurred vision Headache Joint pain, achy muscles Runny nose  Blood pressure 111/61, pulse 70, height 5\' 9"  (1.753 m), weight 187 lb 8 oz (85 kg).  Physical Exam  General: The patient is alert and cooperative at the time of the examination.  Eyes: Pupils are equal, round, and reactive to light. Discs are flat bilaterally.  Neck: The neck is supple, no carotid bruits are noted.  Respiratory: The respiratory examination is clear.  Cardiovascular: The cardiovascular examination reveals a regular rate and rhythm, no obvious murmurs or rubs are noted.  Skin: Extremities are with 1+ edema at the ankles bilaterally.  Neurologic Exam  Mental status: The patient is alert and oriented x 3 at the time of the examination. The patient has apparent normal recent and remote memory, with an apparently normal attention span and concentration ability.  Cranial nerves: Facial symmetry is present. There is good sensation of the face to pinprick and soft touch bilaterally. The strength of the facial muscles and the muscles to head turning and shoulder shrug are normal bilaterally. Speech is well enunciated, no aphasia or dysarthria is noted. Extraocular movements are full. Visual fields are full. The tongue is midline, and the patient has symmetric  elevation of the soft palate. No obvious hearing deficits are noted.  Motor: The motor testing reveals 5 over 5 strength of all 4 extremities. Good symmetric motor tone is noted throughout.  Sensory: Sensory testing is intact to pinprick, soft touch, vibration sensation, and position sense on all 4 extremities, with exception of some slight decrease in vibration sensation in the feet, a stocking pattern sensory deficit in the distal third of the legs bilaterally. No evidence of extinction is noted.  Coordination: Cerebellar testing reveals good finger-nose-finger and heel-to-shin bilaterally.  Gait and station: Gait is normal. Tandem gait is slightly unsteady. Romberg is negative. No drift is seen.  Reflexes: Deep tendon reflexes are symmetric, but are depressed bilaterally. Toes are downgoing bilaterally.   Assessment/Plan:  1. Left supraorbital distribution headache  2. Probable peripheral neuropathy  3. Positional tinnitus  The patient is mainly concerned about the buzzing sensation in the right ear. This is unusual in that it comes on with head turning to  the left and goes away when she turns back to the right. This could represent a type of pulsatile tinnitus. The patient will be set up for MRI evaluation of the brain, and she will have a carotid doppler study. The supraorbital headache on the left appears to be relatively mild, the patient does not appear to be concerned about altering therapy for this. If the pain worsens, another injection may be of benefit.  Jill Alexanders MD 10/04/2016 1:56 PM  [facility] [address] [place], [place] [address]  Phone [phone] Fax [phone]

## 2016-10-13 ENCOUNTER — Other Ambulatory Visit: Payer: PPO

## 2016-10-20 ENCOUNTER — Ambulatory Visit
Admission: RE | Admit: 2016-10-20 | Discharge: 2016-10-20 | Disposition: A | Discharge status: Home / Self Care | Payer: PPO | Source: Ambulatory Visit | Attending: Neurology | Admitting: Neurology

## 2016-10-20 DIAGNOSIS — H9190 Unspecified hearing loss, unspecified ear: Secondary | ICD-10-CM

## 2016-10-20 DIAGNOSIS — G4489 Other headache syndrome: Secondary | ICD-10-CM | POA: Diagnosis not present

## 2016-10-20 DIAGNOSIS — H905 Unspecified sensorineural hearing loss: Secondary | ICD-10-CM | POA: Diagnosis not present

## 2016-10-22 ENCOUNTER — Telehealth: Payer: Self-pay | Admitting: Neurology

## 2016-10-22 NOTE — Telephone Encounter (Signed)
I called the patient. MRI the brain does show a frontal arachnoid cyst, unchanged from 2012. The patient will be having a carotid Doppler study in the near future.   MRI brain 10/21/16:  IMPRESSION:  Abnormal MRI brain (without) demonstrating: 1. Mild frontal and moderate parietal atrophy.  2. Left anterior, parasagittal frontal arachnoid cyst (3.7x2.3cm). 3. No acute findings. 4. No change from MRI on 11/24/10.

## 2016-10-28 ENCOUNTER — Ambulatory Visit (INDEPENDENT_AMBULATORY_CARE_PROVIDER_SITE_OTHER): Payer: PPO

## 2016-10-28 DIAGNOSIS — H9190 Unspecified hearing loss, unspecified ear: Secondary | ICD-10-CM

## 2016-10-28 DIAGNOSIS — G4489 Other headache syndrome: Secondary | ICD-10-CM

## 2016-11-04 DIAGNOSIS — E785 Hyperlipidemia, unspecified: Secondary | ICD-10-CM | POA: Diagnosis not present

## 2016-11-04 DIAGNOSIS — E559 Vitamin D deficiency, unspecified: Secondary | ICD-10-CM | POA: Diagnosis not present

## 2016-11-04 DIAGNOSIS — I10 Essential (primary) hypertension: Secondary | ICD-10-CM | POA: Diagnosis not present

## 2016-11-05 ENCOUNTER — Telehealth: Payer: Self-pay | Admitting: Neurology

## 2016-11-05 NOTE — Telephone Encounter (Signed)
I called the patient. The carotid Doppler study is unremarkable. The pulsatile tinnitus may be related to a venous structure.

## 2017-01-03 DIAGNOSIS — H9311 Tinnitus, right ear: Secondary | ICD-10-CM | POA: Diagnosis not present

## 2017-01-10 ENCOUNTER — Ambulatory Visit (INDEPENDENT_AMBULATORY_CARE_PROVIDER_SITE_OTHER): Payer: PPO | Admitting: Orthopedic Surgery

## 2017-01-10 ENCOUNTER — Encounter: Payer: Self-pay | Admitting: Orthopedic Surgery

## 2017-01-10 ENCOUNTER — Ambulatory Visit (INDEPENDENT_AMBULATORY_CARE_PROVIDER_SITE_OTHER): Payer: PPO

## 2017-01-10 VITALS — BP 110/60 | HR 76 | Ht 69.0 in | Wt 186.0 lb

## 2017-01-10 DIAGNOSIS — M25551 Pain in right hip: Secondary | ICD-10-CM | POA: Diagnosis not present

## 2017-01-10 DIAGNOSIS — M545 Low back pain: Secondary | ICD-10-CM | POA: Diagnosis not present

## 2017-01-10 DIAGNOSIS — M4150 Other secondary scoliosis, site unspecified: Secondary | ICD-10-CM | POA: Diagnosis not present

## 2017-01-10 MED ORDER — DICLOFENAC POTASSIUM 50 MG PO TABS: 50.0000 mg | ORAL_TABLET | Freq: Two times a day (BID) | ORAL | 0 refills | Status: DC

## 2017-01-10 NOTE — Progress Notes (Signed)
New problem  Chief Complaint  Patient presents with  . Hip Pain    RIGHT HIP PAIN DOWN TO RT KNEE    Today we have a 82 year old female with lower back and right hip pain radiating to right knee but not below. She's had it for 2-3 months if not longer. She has moderate to severe pain intermittent in intensity with dull aching pain from the lower back down to the right knee without groin pain   Review of Systems  Constitutional: Negative for malaise/fatigue.  Gastrointestinal: Negative.   Genitourinary: Negative.   Neurological: Negative for tingling, focal weakness and weakness.   Past Medical History:  Diagnosis Date  . Fluttering muscles SEP 2009   MBS/BASW NL  . GERD (gastroesophageal reflux disease)   . Hemorrhoids, internal OCT 2011  . Hereditary and idiopathic peripheral neuropathy 10/04/2016  . Hyperlipidemia   . Hypertension   . IBS (irritable bowel syndrome)    BP 110/60   Pulse 76   Ht 5\' 9"  (1.753 m)   Wt 186 lb (84.4 kg)   BMI 27.47 kg/m  Constitutional continued development normal nutrition normal body habitus normal no gross deformities she is well-groomed  Cardiovascular I observed no swelling or varicosities and palpation revealed normal pulses and temperature in the right and left leg  Lymph nodes were deferred  Gait and station revealed a right-handed cane and an antalgic gait favoring the right side  On the lumbar spine exam. No tenderness in the central part of the spine but tenderness over the right SI joint right buttock she had decreased range of motion in flexion of her lumbar spine and we did not palpate any dislocation or subluxation in the back no step-offs are felt  There was normal muscle tone  The skin overlying the lumbar spine was normal. She had normal deep reflexes on both side negative straight leg raise is normal sensation in both right and left feet and she was discharged time person place mood and affect are normal  Hip flexion  right and left were 120  X-rays show degenerative scoliosis and a normal pelvis  Encounter Diagnoses  Name Primary?  . Low back pain, unspecified back pain laterality, unspecified chronicity, with sciatica presence unspecified Yes  . Right hip pain   . Degenerative scoliosis in adult patient    Recommend physical therapy and anti-inflammatory medication  Diclofenac 50 mg twice a day #60

## 2017-01-19 ENCOUNTER — Ambulatory Visit (HOSPITAL_COMMUNITY): Payer: PPO | Attending: Orthopedic Surgery | Admitting: Physical Therapy

## 2017-01-19 ENCOUNTER — Telehealth: Payer: Self-pay | Admitting: Orthopedic Surgery

## 2017-01-19 DIAGNOSIS — M5416 Radiculopathy, lumbar region: Secondary | ICD-10-CM | POA: Insufficient documentation

## 2017-01-19 DIAGNOSIS — M6281 Muscle weakness (generalized): Secondary | ICD-10-CM | POA: Insufficient documentation

## 2017-01-19 DIAGNOSIS — R293 Abnormal posture: Secondary | ICD-10-CM | POA: Insufficient documentation

## 2017-01-19 NOTE — Patient Instructions (Addendum)
Knee-to-Chest Stretch: Unilateral    With hand behind right knee, pull knee in to chest until a comfortable stretch is felt in lower back and buttocks. Keep back relaxed. Hold 30____ seconds. Repeat _3___ times per set. Do __1__ sets per session. Do _2___ sessions per day.  http://orth.exer.us/126   Copyright  VHI. All rights reserved.  Stretching: Hamstring (Supine)    Supporting right thigh behind knee, slowly straighten knee until stretch is felt in back of thigh. Hold _30___ seconds. Repeat ___3_ times per set. Do ___1_ sets per session. Do _2___ sessions per day.  http://orth.exer.us/656   Copyright  VHI. All rights reserved.

## 2017-01-19 NOTE — Telephone Encounter (Signed)
Katherine Joseph called and stated that she stopped taking the Diclofenac because it gave her diarrhea.  She didn't know if there was something else Dr. Aline Brochure might want to give to her.  If so, she uses [facility].

## 2017-01-19 NOTE — Therapy (Signed)
[facility] [address] [place], [place], [address] Phone: [phone]   Fax:  [phone]  Physical Therapy Evaluation  Patient Details  Name: Katherine Joseph MRN: [id number] Date of Birth: [birth year] Referring Provider: Arther Abbott   Encounter Date: 01/19/2017      PT End of Session - 01/19/17 1206    Visit Number 1   Number of Visits 12   Date for PT Re-Evaluation 02/18/17   Authorization - Visit Number 1   Authorization - Number of Visits 10   PT Start Time 1125   PT Stop Time 1203   PT Time Calculation (min) 38 min   Activity Tolerance Patient tolerated treatment well   Behavior During Therapy [facility] for tasks assessed/performed      Past Medical History:  Diagnosis Date  . Fluttering muscles SEP 2009   MBS/BASW NL  . GERD (gastroesophageal reflux disease)   . Hemorrhoids, internal OCT 2011  . Hereditary and idiopathic peripheral neuropathy 10/04/2016  . Hyperlipidemia   . Hypertension   . IBS (irritable bowel syndrome)     Past Surgical History:  Procedure Laterality Date  . COLONOSCOPY  2004   RMR/NUR INFLAMMATORY POLYP, POST POLYPECTOMY BLEED  . COLONOSCOPY  oct 2011 SLF nvd-WEIGHT LOSS   NL TI, [place]/DC TICS  . ESOPHAGOGASTRODUODENOSCOPY  2006   W/DILATION W/RMR  . KNEE ARTHROSCOPY    . RIGHT FOOT SURGERY    . TOTAL KNEE ARTHROPLASTY Left 05/21/2015   Procedure: TOTAL KNEE ARTHROPLASTY;  Surgeon: Carole Civil, MD;  Location: [facility];  Service: Orthopedics;  Laterality: Left;    There were no vitals filed for this visit.       Subjective Assessment - 01/19/17 1123    Subjective Ms. Barbour states that she has been experiencing low back pain that radiates down to her right knee.  She states that the greatest pain is in her right hip.  She has more pain when weight bearing.     Pertinent History HTN, OA   How long can you sit comfortably? no problem    How long can you stand comfortably? Able to stand  for    How long can you walk comfortably? Pt does not do much walking due to knee pain.     Patient Stated Goals Pt wants to feel better but she doesnt think she can get rid of all of her pain.     Currently in Pain? Yes   Pain Score 0-No pain  worst pain 9/10    Pain Location Hip   Pain Orientation Right   Pain Descriptors / Indicators --  Just Hurts.    Pain Type Chronic pain   Pain Radiating Towards right knee    Pain Onset More than a month ago   Pain Frequency Intermittent   Aggravating Factors  walking    Pain Relieving Factors rubbing a sauve on her back    Effect of Pain on Daily Activities yard work             [facility] PT Assessment - 01/19/17 0001      Assessment   Medical Diagnosis  Rt radicular back pain   Referring Provider Arther Abbott    Onset Date/Surgical Date 10/04/16   Next MD Visit 02/21/2017   Prior Therapy for knees   completing bridging, standing abduction from knee therapy     Precautions   Precautions None     Restrictions   Weight Bearing Restrictions  No     Balance Screen   Has the patient fallen in the past 6 months No   Has the patient had a decrease in activity level because of a fear of falling?  Yes   Is the patient reluctant to leave their home because of a fear of falling?  No     Home Ecologist residence     Prior Function   Level of Independence Independent     Cognition   Overall Cognitive Status Within Functional Limits for tasks assessed     Observation/Other Assessments   Observations --   Focus on Therapeutic Outcomes (FOTO)  48     Posture/Postural Control   Posture/Postural Control Postural limitations   Posture Comments Rt shoulder is low; PSIS high; iliac crest high; ASIS low ; Rt iliac crest anteriorly rotated      ROM / Strength   AROM / PROM / Strength AROM;Strength     AROM   AROM Assessment Site Lumbar   Lumbar Extension increase pain with extension      Strength    Strength Assessment Site Hip;Knee;Ankle   Right/Left Hip Right;Left   Right Hip Flexion 4/5   Right Hip Extension 4/5   Right Hip ABduction 3+/5   Left Hip Flexion 4/5   Left Hip Extension 4/5   Left Hip ABduction 3+/5   Right/Left Knee Right;Left   Right Knee Flexion 4/5   Right Knee Extension 5/5   Left Knee Flexion 4/5   Left Knee Extension 5/5   Right/Left Ankle Right;Left   Right Ankle Dorsiflexion 4-/5   Left Ankle Dorsiflexion 5/5     Flexibility   Soft Tissue Assessment /Muscle Length yes   Hamstrings Lt: 145; RT 155                   [facility] Adult PT Treatment/Exercise - 01/19/17 0001      Exercises   Exercises Lumbar     Lumbar Exercises: Stretches   Active Hamstring Stretch 2 reps;30 seconds   Single Knee to Chest Stretch 2 reps;30 seconds     Manual Therapy   Manual Therapy Muscle Energy Technique   Manual therapy comments done seperate from all other aspectes of treatment   Muscle Energy Technique for correction of SI rotation.                 PT Education - 01/19/17 1205    Education provided Yes   Education Details Add new exercises to program    Person(s) Educated Patient   Methods Explanation;Handout   Comprehension Verbalized understanding;Returned demonstration          PT Short Term Goals - 01/19/17 1213      PT SHORT TERM GOAL #1   Title Pt Right leg pain to be no greater than a 4/10 to allow pt to walk for up to fifteen  minutes to be able to complete short shopping trips with increased ease.    Time 3   Period Weeks   Status New     PT SHORT TERM GOAL #2   Title Pt SI to be aligned to decrease right leg pain    Time 3   Period Weeks   Status New     PT SHORT TERM GOAL #3   Title Pt to be able to demonstrate good body mechanics to get in and out of bed, roll and sit to stand for decreased stress on low back  area.    Time 3   Period Weeks   Status New           PT Long Term Goals - 01/19/17 1215      PT  LONG TERM GOAL #1   Title PT Rt leg pain to be no greater than a 2/10 to allow pt to be able to be on her feet for 30 mintues to complete household chores.    Time 6   Period Weeks   Status New     PT LONG TERM GOAL #2   Title Pt to demonstrate proper body mechanics for mopping and vacuuming to keep stress off of her low back.    Time 6   Period Weeks   Status New     PT LONG TERM GOAL #3   Title Pt LE strength to increase by one grade so that pt is able to go up and down her steps in a reciprocal manner with confidence    Time 6   Period Weeks   Status New               Plan - 01/19/17 1206-01-04    Clinical Impression Statement Ms. Garro is a 82 yo female who has been having Rt radicular pain to her right knee for several months.  She went to the MD who referred her to skilled physical therapy.  Examination demonsrates SI dysfunction, tight mm, decreased strength and difficulty walking.  Ms. Vicenta Dunning will benefit from skilled physcial therapy to address these issues and maximize her functional ability.    Rehab Potential Good   PT Frequency 2x / week   PT Duration 6 weeks   PT Treatment/Interventions ADLs/Self Care Home Management;Moist Heat;Ultrasound;Functional mobility training;Therapeutic activities;Therapeutic exercise;Balance training;Patient/family education;Manual techniques   PT Next Visit Plan check and adjust SI if needed;  begin decompressive 1-5 ;pirformis stretches, 3 D hip and thoracic excursion, POE.  Progressing to functional strengthening with tight core beginning with bed mobiltiy.Marland Kitchen     Consulted and Agree with Plan of Care Patient      Patient will benefit from skilled therapeutic intervention in order to improve the following deficits and impairments:  Decreased activity tolerance, Decreased balance, Decreased strength, Difficulty walking, Impaired flexibility, Pain, Postural dysfunction  Visit Diagnosis: Radiculopathy, lumbar region - Plan: PT plan of care  cert/re-cert  Muscle weakness (generalized) - Plan: PT plan of care cert/re-cert  Abnormal posture - Plan: PT plan of care cert/re-cert      G-Codes - 23/55/73 January 04, 1217    Functional Assessment Tool Used (Outpatient Only) foto   Functional Limitation Other PT primary   Other PT Primary Current Status (U2025) At least 40 percent but less than 60 percent impaired, limited or restricted   Other PT Primary Goal Status (K2706) At least 40 percent but less than 60 percent impaired, limited or restricted       Problem List Patient Active Problem List   Diagnosis Date Noted  . Perceived hearing changes 10/04/2016  . Headache syndrome 10/04/2016  . Hereditary and idiopathic peripheral neuropathy 10/04/2016  . Total knee replacement status 05/21/2015  . Arthritis of knee, degenerative 05/21/2015  . Arthritis of left knee   . Osteoarthritis of right knee 02/14/2014  . Hip pain, chronic 05/16/2013  . Right knee pain 05/16/2013  . Radicular leg pain 05/16/2013  . Scoliosis of lumbar spine 05/16/2013  . Colon cancer screening 01/13/2011  . Microcytic anemia 09/03/2010  . WEIGHT LOSS 06/02/2010  . HYPERLIPIDEMIA  07/01/2009  . Essential hypertension 07/01/2009  . GERD 12/04/2008  . IBS 12/04/2008    Rayetta Humphrey, PT CLT [phone] 01/19/2017, 12:23 PM  [facility] [address] [place], [place], [address] Phone: [phone]   Fax:  [phone]  Name: SHARLEEN SZCZESNY MRN: [id number] Date of Birth: [birth year]

## 2017-01-24 DIAGNOSIS — H9311 Tinnitus, right ear: Secondary | ICD-10-CM | POA: Diagnosis not present

## 2017-01-24 DIAGNOSIS — M159 Polyosteoarthritis, unspecified: Secondary | ICD-10-CM | POA: Diagnosis not present

## 2017-01-24 DIAGNOSIS — E785 Hyperlipidemia, unspecified: Secondary | ICD-10-CM | POA: Diagnosis not present

## 2017-01-24 DIAGNOSIS — Z0389 Encounter for observation for other suspected diseases and conditions ruled out: Secondary | ICD-10-CM | POA: Diagnosis not present

## 2017-01-24 DIAGNOSIS — I10 Essential (primary) hypertension: Secondary | ICD-10-CM | POA: Diagnosis not present

## 2017-01-24 NOTE — Telephone Encounter (Signed)
I recommend tylenol 500 mg q 6 or advil 400 mg q 8 OTC

## 2017-01-24 NOTE — Telephone Encounter (Signed)
Called patient, no answer, left vm 

## 2017-01-24 NOTE — Telephone Encounter (Signed)
Patient aware.

## 2017-01-24 NOTE — Telephone Encounter (Signed)
Routing to Dr. Harrison to advise 

## 2017-01-25 ENCOUNTER — Ambulatory Visit (HOSPITAL_COMMUNITY): Payer: PPO | Admitting: Physical Therapy

## 2017-01-25 DIAGNOSIS — R293 Abnormal posture: Secondary | ICD-10-CM

## 2017-01-25 DIAGNOSIS — M6281 Muscle weakness (generalized): Secondary | ICD-10-CM

## 2017-01-25 DIAGNOSIS — M5416 Radiculopathy, lumbar region: Secondary | ICD-10-CM

## 2017-01-25 NOTE — Therapy (Signed)
[place], [place], [address] Phone: [phone]   Fax:  [phone]  Physical Therapy Treatment  Patient Details  Name: Katherine Joseph MRN: [id number] Date of Birth: [birth year] Referring Provider: Arther Abbott   Encounter Date: 01/25/2017      PT End of Session - 01/25/17 1419    Visit Number 2   Number of Visits 12   Date for PT Re-Evaluation 02/18/17   Authorization - Visit Number 2   Authorization - Number of Visits 10   PT Start Time 2637   PT Stop Time 1432   PT Time Calculation (min) 39 min   Activity Tolerance Patient tolerated treatment well   Behavior During Therapy [facility] for tasks assessed/performed      Past Medical History:  Diagnosis Date  . Fluttering muscles SEP 2009   MBS/BASW NL  . GERD (gastroesophageal reflux disease)   . Hemorrhoids, internal OCT 2011  . Hereditary and idiopathic peripheral neuropathy 10/04/2016  . Hyperlipidemia   . Hypertension   . IBS (irritable bowel syndrome)     Past Surgical History:  Procedure Laterality Date  . COLONOSCOPY  2004   RMR/NUR INFLAMMATORY POLYP, POST POLYPECTOMY BLEED  . COLONOSCOPY  oct 2011 SLF nvd-WEIGHT LOSS   NL TI, [place]/DC TICS  . ESOPHAGOGASTRODUODENOSCOPY  2006   W/DILATION W/RMR  . KNEE ARTHROSCOPY    . RIGHT FOOT SURGERY    . TOTAL KNEE ARTHROPLASTY Left 05/21/2015   Procedure: TOTAL KNEE ARTHROPLASTY;  Surgeon: Carole Civil, MD;  Location: [facility];  Service: Orthopedics;  Laterality: Left;    There were no vitals filed for this visit.      Subjective Assessment - 01/25/17 1356    Subjective Pt states "you can't do anything for arthritis" states she's not hurting because she "rubbed on some cream, aspercream" but still states pain is 6/10.     Currently in Pain? Yes   Pain Score 6    Pain Location Hip                         [facility] Adult PT Treatment/Exercise - 01/25/17 0001      Lumbar  Exercises: Stretches   Active Hamstring Stretch 3 reps;30 seconds   Single Knee to Chest Stretch 3 reps;30 seconds   Piriformis Stretch 2 reps;30 seconds   Piriformis Stretch Limitations seated bilaterally     Lumbar Exercises: Seated   Hip Flexion on Ball Limitations thoracic excursion 5 reps each     Lumbar Exercises: Supine   Other Supine Lumbar Exercises decompression 1-5 5 reps 3" holds                PT Education - 01/25/17 1431    Education provided Yes   Education Details reviewed HEP, initial evaluation goals   Person(s) Educated Patient   Methods Explanation;Demonstration;Tactile cues;Handout;Verbal cues   Comprehension Verbalized understanding;Verbal cues required;Returned demonstration;Tactile cues required          PT Short Term Goals - 01/19/17 1213      PT SHORT TERM GOAL #1   Title Pt Right leg pain to be no greater than a 4/10 to allow pt to walk for up to fifteen  minutes to be able to complete short shopping trips with increased ease.    Time 3   Period Weeks   Status New     PT SHORT TERM GOAL #2   Title Pt  SI to be aligned to decrease right leg pain    Time 3   Period Weeks   Status New     PT SHORT TERM GOAL #3   Title Pt to be able to demonstrate good body mechanics to get in and out of bed, roll and sit to stand for decreased stress on low back area.    Time 3   Period Weeks   Status New           PT Long Term Goals - 01/19/17 1215      PT LONG TERM GOAL #1   Title PT Rt leg pain to be no greater than a 2/10 to allow pt to be able to be on her feet for 30 mintues to complete household chores.    Time 6   Period Weeks   Status New     PT LONG TERM GOAL #2   Title Pt to demonstrate proper body mechanics for mopping and vacuuming to keep stress off of her low back.    Time 6   Period Weeks   Status New     PT LONG TERM GOAL #3   Title Pt LE strength to increase by one grade so that pt is able to go up and down her steps in  a reciprocal manner with confidence    Time 6   Period Weeks   Status New               Plan - 01/25/17 1420    Clinical Impression Statement SI in good alignment today.  Progressed with prone extension therex, piriformis stretch and decompression exericses  as in PT POC.  Pt requires max verbal and tactile cues to complete in correct form and count of therex.  PT reports overall improvement at end of session from new activities and stretches.    Rehab Potential Good   PT Frequency 2x / week   PT Duration 6 weeks   PT Treatment/Interventions ADLs/Self Care Home Management;Moist Heat;Ultrasound;Functional mobility training;Therapeutic activities;Therapeutic exercise;Balance training;Patient/family education;Manual techniques   PT Next Visit Plan check and adjust SI if needed;  continue with pirformis stretches, 3 D hip and thoracic excursion, and prone extension exercises.  Next session begin 3D hip excursion and functional strengthening with core stability.       PT Home Exercise Plan eval:  SKTC, hamstring stretch 5/8:  decompression exercises 1-5   Consulted and Agree with Plan of Care Patient      Patient will benefit from skilled therapeutic intervention in order to improve the following deficits and impairments:  Decreased activity tolerance, Decreased balance, Decreased strength, Difficulty walking, Impaired flexibility, Pain, Postural dysfunction  Visit Diagnosis: Radiculopathy, lumbar region  Muscle weakness (generalized)  Abnormal posture     Problem List Patient Active Problem List   Diagnosis Date Noted  . Perceived hearing changes 10/04/2016  . Headache syndrome 10/04/2016  . Hereditary and idiopathic peripheral neuropathy 10/04/2016  . Total knee replacement status 05/21/2015  . Arthritis of knee, degenerative 05/21/2015  . Arthritis of left knee   . Osteoarthritis of right knee 02/14/2014  . Hip pain, chronic 05/16/2013  . Right knee pain 05/16/2013  .  Radicular leg pain 05/16/2013  . Scoliosis of lumbar spine 05/16/2013  . Colon cancer screening 01/13/2011  . Microcytic anemia 09/03/2010  . WEIGHT LOSS 06/02/2010  . HYPERLIPIDEMIA 07/01/2009  . Essential hypertension 07/01/2009  . GERD 12/04/2008  . IBS 12/04/2008    Teena Irani, PTA/CLT  [phone]  01/25/2017, 2:34 PM  [place], [place], [address] Phone: [phone]   Fax:  [phone]  Name: Katherine Joseph MRN: [id number] Date of Birth: June 16, 1935

## 2017-01-27 ENCOUNTER — Ambulatory Visit (INDEPENDENT_AMBULATORY_CARE_PROVIDER_SITE_OTHER): Payer: PPO | Admitting: Otolaryngology

## 2017-01-27 ENCOUNTER — Ambulatory Visit (HOSPITAL_COMMUNITY): Payer: PPO

## 2017-01-27 DIAGNOSIS — H903 Sensorineural hearing loss, bilateral: Secondary | ICD-10-CM | POA: Diagnosis not present

## 2017-01-27 DIAGNOSIS — H9311 Tinnitus, right ear: Secondary | ICD-10-CM | POA: Diagnosis not present

## 2017-01-27 DIAGNOSIS — M5416 Radiculopathy, lumbar region: Secondary | ICD-10-CM | POA: Diagnosis not present

## 2017-01-27 DIAGNOSIS — M6281 Muscle weakness (generalized): Secondary | ICD-10-CM

## 2017-01-27 DIAGNOSIS — R293 Abnormal posture: Secondary | ICD-10-CM

## 2017-01-27 NOTE — Therapy (Signed)
[place], [place], [address] Phone: [phone]   Fax:  [phone]  Physical Therapy Treatment  Patient Details  Name: Katherine Joseph MRN: [id number] Date of Birth: [birth year] Referring Provider: Arther Abbott   Encounter Date: 01/27/2017      PT End of Session - 01/27/17 1122    Visit Number 3   Number of Visits 12   Date for PT Re-Evaluation 02/18/17   Authorization - Visit Number 3   Authorization - Number of Visits 10   PT Start Time 7035   PT Stop Time 0093   PT Time Calculation (min) 38 min   Activity Tolerance Patient tolerated treatment well;No increased pain   Behavior During Therapy WFL for tasks assessed/performed      Past Medical History:  Diagnosis Date  . Fluttering muscles SEP 2009   MBS/BASW NL  . GERD (gastroesophageal reflux disease)   . Hemorrhoids, internal OCT 2011  . Hereditary and idiopathic peripheral neuropathy 10/04/2016  . Hyperlipidemia   . Hypertension   . IBS (irritable bowel syndrome)     Past Surgical History:  Procedure Laterality Date  . COLONOSCOPY  2004   RMR/NUR INFLAMMATORY POLYP, POST POLYPECTOMY BLEED  . COLONOSCOPY  oct 2011 SLF nvd-WEIGHT LOSS   NL TI, [place]/DC TICS  . ESOPHAGOGASTRODUODENOSCOPY  2006   W/DILATION W/RMR  . KNEE ARTHROSCOPY    . RIGHT FOOT SURGERY    . TOTAL KNEE ARTHROPLASTY Left 05/21/2015   Procedure: TOTAL KNEE ARTHROPLASTY;  Surgeon: Carole Civil, MD;  Location: [facility];  Service: Orthopedics;  Laterality: Left;    There were no vitals filed for this visit.      Subjective Assessment - 01/27/17 1112    Subjective Pt stated her lower back is feeling okay today, reports she uses small patches for pain in lower back as well as rubbing cream, aspercream.  Reports she has increased pain Rt side following racking outdoors yesterday, pain scale 7/10.   Pertinent History HTN, OA   Patient Stated Goals Pt wants to feel better but  she doesnt think she can get rid of all of her pain.     Currently in Pain? Yes   Pain Score 7    Pain Location Back   Pain Orientation Lower;Right  Lower Rt side   Pain Descriptors / Indicators Aching   Pain Type Chronic pain   Pain Radiating Towards Rt knee   Pain Onset More than a month ago   Pain Frequency Intermittent   Aggravating Factors  walking   Pain Relieving Factors rubbing a sauve on her back   Effect of Pain on Daily Activities yard work                         [employer] Adult PT Treatment/Exercise - 01/27/17 0001      Lumbar Exercises: Stretches   Active Hamstring Stretch 2 reps;30 seconds   Single Knee to Chest Stretch 1 rep;30 seconds   Prone on Elbows Stretch --  2 min   Press Ups 5 reps;10 seconds   Piriformis Stretch 2 reps;30 seconds   Piriformis Stretch Limitations supine figure 4 with towel     Lumbar Exercises: Standing   Functional Squats 5 reps   Functional Squats Limitations 3D hip excursion     Lumbar Exercises: Seated   Hip Flexion on Ball Limitations thoracic excursion with UE movements5 reps each  Lumbar Exercises: Prone   Straight Leg Raise 10 reps                  PT Short Term Goals - 01/19/17 1213      PT SHORT TERM GOAL #1   Title Pt Right leg pain to be no greater than a 4/10 to allow pt to walk for up to fifteen  minutes to be able to complete short shopping trips with increased ease.    Time 3   Period Weeks   Status New     PT SHORT TERM GOAL #2   Title Pt SI to be aligned to decrease right leg pain    Time 3   Period Weeks   Status New     PT SHORT TERM GOAL #3   Title Pt to be able to demonstrate good body mechanics to get in and out of bed, roll and sit to stand for decreased stress on low back area.    Time 3   Period Weeks   Status New           PT Long Term Goals - 01/19/17 1215      PT LONG TERM GOAL #1   Title PT Rt leg pain to be no greater than a 2/10 to allow pt to be able  to be on her feet for 30 mintues to complete household chores.    Time 6   Period Weeks   Status New     PT LONG TERM GOAL #2   Title Pt to demonstrate proper body mechanics for mopping and vacuuming to keep stress off of her low back.    Time 6   Period Weeks   Status New     PT LONG TERM GOAL #3   Title Pt LE strength to increase by one grade so that pt is able to go up and down her steps in a reciprocal manner with confidence    Time 6   Period Weeks   Status New               Plan - 01/27/17 1135    Clinical Impression Statement SI in good alignment today.  Moderate verbal and tactile cueing for correct form and appropriate hold times/reps with therex.  Progressed to prone press up, hip extension and 3D hip excursion for lumbar mobilty and gluteal strengthening.  EOS pt reports pain resolved.   Rehab Potential Good   PT Frequency 2x / week   PT Duration 6 weeks   PT Next Visit Plan check and adjust SI if needed;  continue with pirformis stretches, 3 D hip and thoracic excursion, and prone extension exercises.  Next session progress CKC exercises for functional strengthening with core stability.       PT Home Exercise Plan eval:  SKTC, hamstring stretch 5/8:  decompression exercises 1-5      Patient will benefit from skilled therapeutic intervention in order to improve the following deficits and impairments:  Decreased activity tolerance, Decreased balance, Decreased strength, Difficulty walking, Impaired flexibility, Pain, Postural dysfunction  Visit Diagnosis: Radiculopathy, lumbar region  Muscle weakness (generalized)  Abnormal posture     Problem List Patient Active Problem List   Diagnosis Date Noted  . Perceived hearing changes 10/04/2016  . Headache syndrome 10/04/2016  . Hereditary and idiopathic peripheral neuropathy 10/04/2016  . Total knee replacement status 05/21/2015  . Arthritis of knee, degenerative 05/21/2015  . Arthritis of left knee   .  Osteoarthritis of right knee 02/14/2014  . Hip pain, chronic 05/16/2013  . Right knee pain 05/16/2013  . Radicular leg pain 05/16/2013  . Scoliosis of lumbar spine 05/16/2013  . Colon cancer screening 01/13/2011  . Microcytic anemia 09/03/2010  . WEIGHT LOSS 06/02/2010  . HYPERLIPIDEMIA 07/01/2009  . Essential hypertension 07/01/2009  . GERD 12/04/2008  . IBS 12/04/2008   Ihor Austin, [place]; [place]  Aldona Lento 01/27/2017, 12:02 PM  [place] [address] [place], [place], [address] Phone: [phone]   Fax:  [phone]  Name: Katherine Joseph MRN: [id number] Date of Birth: 1935-02-26

## 2017-02-01 ENCOUNTER — Ambulatory Visit (HOSPITAL_COMMUNITY): Payer: PPO | Admitting: Physical Therapy

## 2017-02-01 DIAGNOSIS — R293 Abnormal posture: Secondary | ICD-10-CM

## 2017-02-01 DIAGNOSIS — M5416 Radiculopathy, lumbar region: Secondary | ICD-10-CM

## 2017-02-01 DIAGNOSIS — M6281 Muscle weakness (generalized): Secondary | ICD-10-CM

## 2017-02-01 NOTE — Therapy (Signed)
[place] [address] [place], [place], [address] Phone: [phone]   Fax:  [phone]  Physical Therapy Treatment  Patient Details  Name: Katherine Joseph MRN: [id number] Date of Birth: 11/14/1934 Referring Provider: Arther Abbott   Encounter Date: 02/01/2017      PT End of Session - 02/01/17 1625    Visit Number 4   Number of Visits 12   Date for PT Re-Evaluation 02/18/17   Authorization - Visit Number 4   Authorization - Number of Visits 10   PT Start Time 1300   PT Stop Time 3903   PT Time Calculation (min) 45 min   Activity Tolerance Patient tolerated treatment well;No increased pain   Behavior During Therapy WFL for tasks assessed/performed      Past Medical History:  Diagnosis Date  . Fluttering muscles SEP 2009   MBS/BASW NL  . GERD (gastroesophageal reflux disease)   . Hemorrhoids, internal OCT 2011  . Hereditary and idiopathic peripheral neuropathy 10/04/2016  . Hyperlipidemia   . Hypertension   . IBS (irritable bowel syndrome)     Past Surgical History:  Procedure Laterality Date  . COLONOSCOPY  2004   RMR/NUR INFLAMMATORY POLYP, POST POLYPECTOMY BLEED  . COLONOSCOPY  oct 2011 SLF nvd-WEIGHT LOSS   NL TI, [place]/DC TICS  . ESOPHAGOGASTRODUODENOSCOPY  2006   W/DILATION W/RMR  . KNEE ARTHROSCOPY    . RIGHT FOOT SURGERY    . TOTAL KNEE ARTHROPLASTY Left 05/21/2015   Procedure: TOTAL KNEE ARTHROPLASTY;  Surgeon: Carole Civil, MD;  Location: [facility];  Service: Orthopedics;  Laterality: Left;    There were no vitals filed for this visit.      Subjective Assessment - 02/01/17 1309    Subjective Pt reports no pain currently but hurt over the weekend. states she did alot of cooking.   Currently in Pain? No/denies                         [facility] Adult PT Treatment/Exercise - 02/01/17 0001      Lumbar Exercises: Stretches   Active Hamstring Stretch 2 reps;30 seconds   Prone on Elbows  Stretch Other (comment)  2 minutes   Press Ups 5 reps;10 seconds     Lumbar Exercises: Standing   Heel Raises 10 reps   Functional Squats 10 reps   Functional Squats Limitations 3D hip excursion 5 reps   Forward Lunge 10 reps   Forward Lunge Limitations onto 4" step no UE's     Lumbar Exercises: Seated   Hip Flexion on Ball Limitations thoracic excursion with UE movements5 reps each     Lumbar Exercises: Prone   Straight Leg Raise 10 reps                  PT Short Term Goals - 01/19/17 1213      PT SHORT TERM GOAL #1   Title Pt Right leg pain to be no greater than a 4/10 to allow pt to walk for up to fifteen  minutes to be able to complete short shopping trips with increased ease.    Time 3   Period Weeks   Status New     PT SHORT TERM GOAL #2   Title Pt SI to be aligned to decrease right leg pain    Time 3   Period Weeks   Status New     PT SHORT TERM GOAL #3  Title Pt to be able to demonstrate good body mechanics to get in and out of bed, roll and sit to stand for decreased stress on low back area.    Time 3   Period Weeks   Status New           PT Long Term Goals - 01/19/17 1215      PT LONG TERM GOAL #1   Title PT Rt leg pain to be no greater than a 2/10 to allow pt to be able to be on her feet for 30 mintues to complete household chores.    Time 6   Period Weeks   Status New     PT LONG TERM GOAL #2   Title Pt to demonstrate proper body mechanics for mopping and vacuuming to keep stress off of her low back.    Time 6   Period Weeks   Status New     PT LONG TERM GOAL #3   Title Pt LE strength to increase by one grade so that pt is able to go up and down her steps in a reciprocal manner with confidence    Time 6   Period Weeks   Status New               Plan - 02/01/17 1625    Clinical Impression Statement Continued progression of standing funtional strengthening exericses.  PT able to complete with cues for proper form.   Extension seems to decrease symptoms with overall reduction this session.  Pt continues to progress towards goals.     Rehab Potential Good   PT Frequency 2x / week   PT Duration 6 weeks   PT Next Visit Plan check and adjust SI if needed.  Next session begin postural theraband exericses.  Progress to decompression theraband therex.    PT Home Exercise Plan eval:  SKTC, hamstring stretch 5/8:  decompression exercises 1-5      Patient will benefit from skilled therapeutic intervention in order to improve the following deficits and impairments:  Decreased activity tolerance, Decreased balance, Decreased strength, Difficulty walking, Impaired flexibility, Pain, Postural dysfunction  Visit Diagnosis: Radiculopathy, lumbar region  Muscle weakness (generalized)  Abnormal posture     Problem List Patient Active Problem List   Diagnosis Date Noted  . Perceived hearing changes 10/04/2016  . Headache syndrome 10/04/2016  . Hereditary and idiopathic peripheral neuropathy 10/04/2016  . Total knee replacement status 05/21/2015  . Arthritis of knee, degenerative 05/21/2015  . Arthritis of left knee   . Osteoarthritis of right knee 02/14/2014  . Hip pain, chronic 05/16/2013  . Right knee pain 05/16/2013  . Radicular leg pain 05/16/2013  . Scoliosis of lumbar spine 05/16/2013  . Colon cancer screening 01/13/2011  . Microcytic anemia 09/03/2010  . WEIGHT LOSS 06/02/2010  . HYPERLIPIDEMIA 07/01/2009  . Essential hypertension 07/01/2009  . GERD 12/04/2008  . IBS 12/04/2008    Teena Irani, PTA/CLT [phone]  02/01/2017, 4:29 PM  [place] [address] [place], [place], [address] Phone: [phone]   Fax:  [phone]  Name: Katherine Joseph MRN: [id number] Date of Birth: Sep 27, 1934

## 2017-02-03 ENCOUNTER — Ambulatory Visit (HOSPITAL_COMMUNITY): Payer: PPO

## 2017-02-03 DIAGNOSIS — R293 Abnormal posture: Secondary | ICD-10-CM

## 2017-02-03 DIAGNOSIS — M5416 Radiculopathy, lumbar region: Secondary | ICD-10-CM

## 2017-02-03 DIAGNOSIS — M6281 Muscle weakness (generalized): Secondary | ICD-10-CM

## 2017-02-03 NOTE — Therapy (Signed)
[place], [place], [address] Phone: [phone]   Fax:  [phone]  Physical Therapy Treatment  Patient Details  Name: Katherine Joseph MRN: [id number] Date of Birth: [birth year] Referring Provider: Arther Abbott   Encounter Date: 02/03/2017      PT End of Session - 02/03/17 1124    Visit Number 5   Number of Visits 12   Date for PT Re-Evaluation 02/18/17   Authorization - Visit Number 5   Authorization - Number of Visits 10   PT Start Time 0938   PT Stop Time 1200   PT Time Calculation (min) 42 min   Activity Tolerance Patient tolerated treatment well;No increased pain   Behavior During Therapy WFL for tasks assessed/performed      Past Medical History:  Diagnosis Date  . Fluttering muscles SEP 2009   MBS/BASW NL  . GERD (gastroesophageal reflux disease)   . Hemorrhoids, internal OCT 2011  . Hereditary and idiopathic peripheral neuropathy 10/04/2016  . Hyperlipidemia   . Hypertension   . IBS (irritable bowel syndrome)     Past Surgical History:  Procedure Laterality Date  . COLONOSCOPY  2004   RMR/NUR INFLAMMATORY POLYP, POST POLYPECTOMY BLEED  . COLONOSCOPY  oct 2011 SLF nvd-WEIGHT LOSS   NL TI, [place]/DC TICS  . ESOPHAGOGASTRODUODENOSCOPY  2006   W/DILATION W/RMR  . KNEE ARTHROSCOPY    . RIGHT FOOT SURGERY    . TOTAL KNEE ARTHROPLASTY Left 05/21/2015   Procedure: TOTAL KNEE ARTHROPLASTY;  Surgeon: Carole Civil, MD;  Location: [facility];  Service: Orthopedics;  Laterality: Left;    There were no vitals filed for this visit.      Subjective Assessment - 02/03/17 1117    Subjective Pt reports no pain currently but did have some pain this morning, feels related to the rain.  Reports she does some HEP daily.   Pertinent History HTN, OA   Patient Stated Goals Pt wants to feel better but she doesnt think she can get rid of all of her pain.     Currently in Pain? No/denies                          [facility] Adult PT Treatment/Exercise - 02/03/17 0001      Lumbar Exercises: Stretches   Active Hamstring Stretch 2 reps;30 seconds   Active Hamstring Stretch Limitations supine   Prone on Elbows Stretch Limitations 2 min     Lumbar Exercises: Standing   Heel Raises 10 reps   Heel Raises Limitations toe raises   Functional Squats 10 reps   Functional Squats Limitations 3D hip excursion    Forward Lunge 10 reps   Forward Lunge Limitations onto 4" step no UE's   Scapular Retraction 10 reps;Theraband   Theraband Level (Scapular Retraction) Level 2 (Red)   Row 10 reps   Theraband Level (Row) Level 2 (Red)   Shoulder Extension 10 reps   Theraband Level (Shoulder Extension) Level 2 (Red)     Lumbar Exercises: Seated   Hip Flexion on Ball Limitations thoracic excursion with UE movements5 reps each     Lumbar Exercises: Prone   Straight Leg Raise 15 reps                  PT Short Term Goals - 01/19/17 1213      PT SHORT TERM GOAL #1   Title Pt Right leg pain to  be no greater than a 4/10 to allow pt to walk for up to fifteen  minutes to be able to complete short shopping trips with increased ease.    Time 3   Period Weeks   Status New     PT SHORT TERM GOAL #2   Title Pt SI to be aligned to decrease right leg pain    Time 3   Period Weeks   Status New     PT SHORT TERM GOAL #3   Title Pt to be able to demonstrate good body mechanics to get in and out of bed, roll and sit to stand for decreased stress on low back area.    Time 3   Period Weeks   Status New           PT Long Term Goals - 01/19/17 1215      PT LONG TERM GOAL #1   Title PT Rt leg pain to be no greater than a 2/10 to allow pt to be able to be on her feet for 30 mintues to complete household chores.    Time 6   Period Weeks   Status New     PT LONG TERM GOAL #2   Title Pt to demonstrate proper body mechanics for mopping and vacuuming to keep stress off of  her low back.    Time 6   Period Weeks   Status New     PT LONG TERM GOAL #3   Title Pt LE strength to increase by one grade so that pt is able to go up and down her steps in a reciprocal manner with confidence    Time 6   Period Weeks   Status New               Plan - 02/03/17 1203    Clinical Impression Statement Added postural theraband with min cueing initially for form.  Pt able to complete all therex wtih no reports of pain with cueing for appropraite form.   Rehab Potential Good   PT Frequency 2x / week   PT Duration 6 weeks   PT Treatment/Interventions ADLs/Self Care Home Management;Moist Heat;Ultrasound;Functional mobility training;Therapeutic activities;Therapeutic exercise;Balance training;Patient/family education;Manual techniques   PT Next Visit Plan Check SI and adjust PRN.  Progress standing stability wtih addition of SLS/based activities.     PT Home Exercise Plan eval:  SKTC, hamstring stretch 5/8:  decompression exercises 1-5      Patient will benefit from skilled therapeutic intervention in order to improve the following deficits and impairments:  Decreased activity tolerance, Decreased balance, Decreased strength, Difficulty walking, Impaired flexibility, Pain, Postural dysfunction  Visit Diagnosis: Radiculopathy, lumbar region  Muscle weakness (generalized)  Abnormal posture     Problem List Patient Active Problem List   Diagnosis Date Noted  . Perceived hearing changes 10/04/2016  . Headache syndrome 10/04/2016  . Hereditary and idiopathic peripheral neuropathy 10/04/2016  . Total knee replacement status 05/21/2015  . Arthritis of knee, degenerative 05/21/2015  . Arthritis of left knee   . Osteoarthritis of right knee 02/14/2014  . Hip pain, chronic 05/16/2013  . Right knee pain 05/16/2013  . Radicular leg pain 05/16/2013  . Scoliosis of lumbar spine 05/16/2013  . Colon cancer screening 01/13/2011  . Microcytic anemia 09/03/2010  .  WEIGHT LOSS 06/02/2010  . HYPERLIPIDEMIA 07/01/2009  . Essential hypertension 07/01/2009  . GERD 12/04/2008  . IBS 12/04/2008   Ihor Austin, LPTA; [place]  Aldona Lento 02/03/2017,  12:08 PM  [place], [place], [address] Phone: [phone]   Fax:  [phone]  Name: Katherine Joseph MRN: [id number] Date of Birth: [birth year]

## 2017-02-08 ENCOUNTER — Ambulatory Visit (HOSPITAL_COMMUNITY): Payer: PPO

## 2017-02-08 DIAGNOSIS — M5416 Radiculopathy, lumbar region: Secondary | ICD-10-CM

## 2017-02-08 DIAGNOSIS — M6281 Muscle weakness (generalized): Secondary | ICD-10-CM

## 2017-02-08 DIAGNOSIS — R293 Abnormal posture: Secondary | ICD-10-CM

## 2017-02-08 NOTE — Therapy (Signed)
[place], [place], [address] Phone: [phone]   Fax:  [phone]  Physical Therapy Treatment  Patient Details  Name: Katherine Joseph MRN: [id number] Date of Birth: [birth year] Referring Provider: Arther Abbott   Encounter Date: 02/08/2017      PT End of Session - 02/08/17 1042    Visit Number 6   Number of Visits 12   Date for PT Re-Evaluation 02/18/17   Authorization - Visit Number 6   Authorization - Number of Visits 10   PT Start Time 3710  pt late for apt today   PT Stop Time 1117   PT Time Calculation (min) 39 min   Activity Tolerance Patient tolerated treatment well;No increased pain   Behavior During Therapy WFL for tasks assessed/performed      Past Medical History:  Diagnosis Date  . Fluttering muscles SEP 2009   MBS/BASW NL  . GERD (gastroesophageal reflux disease)   . Hemorrhoids, internal OCT 2011  . Hereditary and idiopathic peripheral neuropathy 10/04/2016  . Hyperlipidemia   . Hypertension   . IBS (irritable bowel syndrome)     Past Surgical History:  Procedure Laterality Date  . COLONOSCOPY  2004   RMR/NUR INFLAMMATORY POLYP, POST POLYPECTOMY BLEED  . COLONOSCOPY  oct 2011 SLF nvd-WEIGHT LOSS   NL TI, [place]/DC TICS  . ESOPHAGOGASTRODUODENOSCOPY  2006   W/DILATION W/RMR  . KNEE ARTHROSCOPY    . RIGHT FOOT SURGERY    . TOTAL KNEE ARTHROPLASTY Left 05/21/2015   Procedure: TOTAL KNEE ARTHROPLASTY;  Surgeon: Carole Civil, MD;  Location: [facility];  Service: Orthopedics;  Laterality: Left;    There were no vitals filed for this visit.      Subjective Assessment - 02/08/17 1040    Subjective Pt stated she felt good following last session no reports of back pain today.  Reoprts increased soreness Rt>Lt knee today pain scale 7/10   Pertinent History HTN, OA   Patient Stated Goals Pt wants to feel better but she doesnt think she can get rid of all of her pain.     Currently in  Pain? Yes   Pain Score 7    Pain Location Knee   Pain Orientation Right;Left  Rt>Lt knee   Pain Descriptors / Indicators Sore   Pain Type Chronic pain   Pain Radiating Towards Rt knee   Pain Onset More than a month ago   Pain Frequency Intermittent   Aggravating Factors  walking   Pain Relieving Factors rubbing a suave on her back   Effect of Pain on Daily Activities yard work           [employer] Adult PT Treatment/Exercise - 02/08/17 0001      Lumbar Exercises: Stretches   Active Hamstring Stretch 2 reps;30 seconds   Active Hamstring Stretch Limitations supine   Standing Extension 5 reps     Lumbar Exercises: Standing   Heel Raises 15 reps   Heel Raises Limitations toe raises   Functional Squats 10 reps   Functional Squats Limitations 3D hip excursion    Scapular Retraction 15 reps   Theraband Level (Scapular Retraction) Level 2 (Red)   Scapular Retraction Limitations cueing for form   Row 15 reps   Theraband Level (Row) Level 2 (Red)   Row Limitations cueing for form   Shoulder Extension 15 reps   Theraband Level (Shoulder Extension) Level 2 (Red)   Other Standing Lumbar Exercises Lt 18", Rt  30" max of 3   Other Standing Lumbar Exercises sidestep with RTB 1RT                  PT Short Term Goals - 01/19/17 1213      PT SHORT TERM GOAL #1   Title Pt Right leg pain to be no greater than a 4/10 to allow pt to walk for up to fifteen  minutes to be able to complete short shopping trips with increased ease.    Time 3   Period Weeks   Status New     PT SHORT TERM GOAL #2   Title Pt SI to be aligned to decrease right leg pain    Time 3   Period Weeks   Status New     PT SHORT TERM GOAL #3   Title Pt to be able to demonstrate good body mechanics to get in and out of bed, roll and sit to stand for decreased stress on low back area.    Time 3   Period Weeks   Status New           PT Long Term Goals - 01/19/17 1215      PT LONG TERM GOAL #1   Title  PT Rt leg pain to be no greater than a 2/10 to allow pt to be able to be on her feet for 30 mintues to complete household chores.    Time 6   Period Weeks   Status New     PT LONG TERM GOAL #2   Title Pt to demonstrate proper body mechanics for mopping and vacuuming to keep stress off of her low back.    Time 6   Period Weeks   Status New     PT LONG TERM GOAL #3   Title Pt LE strength to increase by one grade so that pt is able to go up and down her steps in a reciprocal manner with confidence    Time 6   Period Weeks   Status New               Plan - 02/08/17 1113    Clinical Impression Statement Added SLS and stability exercises with cueing for form initially with new therex.  Pt able to complete all therex with no c/o pain, was limited by fatigue with task.   Rehab Potential Good   PT Frequency 2x / week   PT Duration 6 weeks   PT Treatment/Interventions ADLs/Self Care Home Management;Moist Heat;Ultrasound;Functional mobility training;Therapeutic activities;Therapeutic exercise;Balance training;Patient/family education;Manual techniques   PT Next Visit Plan Check SI and adjust PRN.  Progress standing stability wtih addition of SLS/based activities.       Patient will benefit from skilled therapeutic intervention in order to improve the following deficits and impairments:  Decreased activity tolerance, Decreased balance, Decreased strength, Difficulty walking, Impaired flexibility, Pain, Postural dysfunction  Visit Diagnosis: Radiculopathy, lumbar region  Muscle weakness (generalized)  Abnormal posture     Problem List Patient Active Problem List   Diagnosis Date Noted  . Perceived hearing changes 10/04/2016  . Headache syndrome 10/04/2016  . Hereditary and idiopathic peripheral neuropathy 10/04/2016  . Total knee replacement status 05/21/2015  . Arthritis of knee, degenerative 05/21/2015  . Arthritis of left knee   . Osteoarthritis of right knee 02/14/2014   . Hip pain, chronic 05/16/2013  . Right knee pain 05/16/2013  . Radicular leg pain 05/16/2013  . Scoliosis of lumbar spine 05/16/2013  .  Colon cancer screening 01/13/2011  . Microcytic anemia 09/03/2010  . WEIGHT LOSS 06/02/2010  . HYPERLIPIDEMIA 07/01/2009  . Essential hypertension 07/01/2009  . GERD 12/04/2008  . IBS 12/04/2008   Ihor Austin, LPTA; [place]  Aldona Lento 02/08/2017, 11:33 AM  [place], [place], [address] Phone: [phone]   Fax:  [phone]  Name: SINAHI KNIGHTS MRN: [id number] Date of Birth: 1935/09/01

## 2017-02-10 ENCOUNTER — Ambulatory Visit (HOSPITAL_COMMUNITY): Payer: PPO | Admitting: Physical Therapy

## 2017-02-10 DIAGNOSIS — M6281 Muscle weakness (generalized): Secondary | ICD-10-CM

## 2017-02-10 DIAGNOSIS — R293 Abnormal posture: Secondary | ICD-10-CM

## 2017-02-10 DIAGNOSIS — M5416 Radiculopathy, lumbar region: Secondary | ICD-10-CM

## 2017-02-10 NOTE — Therapy (Signed)
[facility] [address] [place], [place], [address] Phone: [phone]   Fax:  [phone]  Physical Therapy Treatment  Patient Details  Name: Katherine Joseph MRN: [id number] Date of Birth: 04-18-1935 Referring Provider: Arther Abbott   Encounter Date: 02/10/2017      PT End of Session - 02/10/17 1216    Visit Number 7   Number of Visits 12   Date for PT Re-Evaluation 02/18/17   Authorization - Visit Number 7   Authorization - Number of Visits 10   PT Start Time 8416  patient late    PT Stop Time 1114   PT Time Calculation (min) 31 min   Activity Tolerance Patient tolerated treatment well   Behavior During Therapy [facility] for tasks assessed/performed      Past Medical History:  Diagnosis Date  . Fluttering muscles SEP 2009   MBS/BASW NL  . GERD (gastroesophageal reflux disease)   . Hemorrhoids, internal OCT 2011  . Hereditary and idiopathic peripheral neuropathy 10/04/2016  . Hyperlipidemia   . Hypertension   . IBS (irritable bowel syndrome)     Past Surgical History:  Procedure Laterality Date  . COLONOSCOPY  2004   RMR/NUR INFLAMMATORY POLYP, POST POLYPECTOMY BLEED  . COLONOSCOPY  oct 2011 SLF nvd-WEIGHT LOSS   NL TI, [place]/DC TICS  . ESOPHAGOGASTRODUODENOSCOPY  2006   W/DILATION W/RMR  . KNEE ARTHROSCOPY    . RIGHT FOOT SURGERY    . TOTAL KNEE ARTHROPLASTY Left 05/21/2015   Procedure: TOTAL KNEE ARTHROPLASTY;  Surgeon: Carole Civil, MD;  Location: [facility];  Service: Orthopedics;  Laterality: Left;    There were no vitals filed for this visit.      Subjective Assessment - 02/10/17 1045    Subjective Patient arrives today stating she is feeling good but her knees remains sore   Pertinent History HTN, OA   Patient Stated Goals Pt wants to feel better but she doesnt think she can get rid of all of her pain.     Currently in Pain? No/denies                         [facility] Adult PT  Treatment/Exercise - 02/10/17 0001      Lumbar Exercises: Standing   Heel Raises 20 reps   Heel Raises Limitations toe and heel raises    Functional Squats 10 reps   Functional Squats Limitations reverse chair for form    Forward Lunge 10 reps   Forward Lunge Limitations 4 inch box L, 6 inch box R due to knee pain    Scapular Retraction 15 reps   Theraband Level (Scapular Retraction) Level 3 (Green)   Scapular Retraction Limitations cueing for form   Row 15 reps   Theraband Level (Row) Level 3 (Green)   Shoulder Extension 15 reps   Theraband Level (Shoulder Extension) Level 3 (Green)   Other Standing Lumbar Exercises 3D hip excursions 1x10 each way    Other Standing Lumbar Exercises stepup on 4 inch box 1x10 forward and lateral B LEs              Balance Exercises - 02/10/17 1214      Balance Exercises: Standing   Tandem Stance Eyes open;Foam/compliant surface;1 rep;20 secs           PT Education - 02/10/17 1216    Education provided No          PT Short  Term Goals - 01/19/17 1213      PT SHORT TERM GOAL #1   Title Pt Right leg pain to be no greater than a 4/10 to allow pt to walk for up to fifteen  minutes to be able to complete short shopping trips with increased ease.    Time 3   Period Weeks   Status New     PT SHORT TERM GOAL #2   Title Pt SI to be aligned to decrease right leg pain    Time 3   Period Weeks   Status New     PT SHORT TERM GOAL #3   Title Pt to be able to demonstrate good body mechanics to get in and out of bed, roll and sit to stand for decreased stress on low back area.    Time 3   Period Weeks   Status New           PT Long Term Goals - 01/19/17 1215      PT LONG TERM GOAL #1   Title PT Rt leg pain to be no greater than a 2/10 to allow pt to be able to be on her feet for 30 mintues to complete household chores.    Time 6   Period Weeks   Status New     PT LONG TERM GOAL #2   Title Pt to demonstrate proper body  mechanics for mopping and vacuuming to keep stress off of her low back.    Time 6   Period Weeks   Status New     PT LONG TERM GOAL #3   Title Pt LE strength to increase by one grade so that pt is able to go up and down her steps in a reciprocal manner with confidence    Time 6   Period Weeks   Status New               Plan - 02/10/17 1216    Clinical Impression Statement Continued with general functional strengthening program with close cueing and guarding for correct form today; patient fatigued during session and does demonstrate some functional balance impairment requiring min guard for safety. Recommend increased focus on balance along with progression of functional stabilization exercises next session.    Rehab Potential Good   PT Frequency 2x / week   PT Duration 6 weeks   PT Treatment/Interventions ADLs/Self Care Home Management;Moist Heat;Ultrasound;Functional mobility training;Therapeutic activities;Therapeutic exercise;Balance training;Patient/family education;Manual techniques   PT Next Visit Plan Check SI and adjust PRN.  Progress standing stability wtih addition of SLS/based activities. More focus on balance as well.    PT Home Exercise Plan eval:  SKTC, hamstring stretch 5/8:  decompression exercises 1-5   Consulted and Agree with Plan of Care Patient      Patient will benefit from skilled therapeutic intervention in order to improve the following deficits and impairments:  Decreased activity tolerance, Decreased balance, Decreased strength, Difficulty walking, Impaired flexibility, Pain, Postural dysfunction  Visit Diagnosis: Radiculopathy, lumbar region  Muscle weakness (generalized)  Abnormal posture     Problem List Patient Active Problem List   Diagnosis Date Noted  . Perceived hearing changes 10/04/2016  . Headache syndrome 10/04/2016  . Hereditary and idiopathic peripheral neuropathy 10/04/2016  . Total knee replacement status 05/21/2015  .  Arthritis of knee, degenerative 05/21/2015  . Arthritis of left knee   . Osteoarthritis of right knee 02/14/2014  . Hip pain, chronic 05/16/2013  . Right knee pain 05/16/2013  .  Radicular leg pain 05/16/2013  . Scoliosis of lumbar spine 05/16/2013  . Colon cancer screening 01/13/2011  . Microcytic anemia 09/03/2010  . WEIGHT LOSS 06/02/2010  . HYPERLIPIDEMIA 07/01/2009  . Essential hypertension 07/01/2009  . GERD 12/04/2008  . IBS 12/04/2008    Deniece Ree PT, DPT [facility] [address] [place], [place], [address] Phone: [phone]   Fax:  [phone]  Name: Katherine Joseph MRN: [id number] Date of Birth: Jul 25, 1935

## 2017-02-15 ENCOUNTER — Encounter (HOSPITAL_COMMUNITY): Payer: Self-pay

## 2017-02-15 ENCOUNTER — Ambulatory Visit (HOSPITAL_COMMUNITY): Payer: PPO

## 2017-02-15 DIAGNOSIS — M5416 Radiculopathy, lumbar region: Secondary | ICD-10-CM

## 2017-02-15 DIAGNOSIS — M6281 Muscle weakness (generalized): Secondary | ICD-10-CM

## 2017-02-15 DIAGNOSIS — R293 Abnormal posture: Secondary | ICD-10-CM

## 2017-02-15 NOTE — Therapy (Signed)
[facility] [address] [place], [place], [address] Phone: [phone]   Fax:  [phone]  Physical Therapy Treatment  Patient Details  Name: Katherine Joseph MRN: [id number] Date of Birth: 05/12/1935 Referring Provider: Arther Abbott   Encounter Date: 02/15/2017      PT End of Session - 02/15/17 1306    Visit Number 8   Number of Visits 12   Date for PT Re-Evaluation 02/18/17   Authorization - Visit Number 8   Authorization - Number of Visits 10   PT Start Time 7169   PT Stop Time 1344   PT Time Calculation (min) 42 min   Activity Tolerance Patient tolerated treatment well   Behavior During Therapy [facility] for tasks assessed/performed      Past Medical History:  Diagnosis Date  . Fluttering muscles SEP 2009   MBS/BASW NL  . GERD (gastroesophageal reflux disease)   . Hemorrhoids, internal OCT 2011  . Hereditary and idiopathic peripheral neuropathy 10/04/2016  . Hyperlipidemia   . Hypertension   . IBS (irritable bowel syndrome)     Past Surgical History:  Procedure Laterality Date  . COLONOSCOPY  2004   RMR/NUR INFLAMMATORY POLYP, POST POLYPECTOMY BLEED  . COLONOSCOPY  oct 2011 SLF nvd-WEIGHT LOSS   NL TI, /DC TICS  . ESOPHAGOGASTRODUODENOSCOPY  2006   W/DILATION W/RMR  . KNEE ARTHROSCOPY    . RIGHT FOOT SURGERY    . TOTAL KNEE ARTHROPLASTY Left 05/21/2015   Procedure: TOTAL KNEE ARTHROPLASTY;  Surgeon: Carole Civil, MD;  Location: [facility];  Service: Orthopedics;  Laterality: Left;    There were no vitals filed for this visit.      Subjective Assessment - 02/15/17 1305    Subjective Pt states that she hurts all over. Her knees are bugging.    Pertinent History HTN, OA   How long can you sit comfortably? no problem    How long can you stand comfortably? Able to stand for    How long can you walk comfortably? Pt does not do much walking due to knee pain.     Patient Stated Goals Pt wants to feel better but  she doesnt think she can get rid of all of her pain.     Currently in Pain? Yes   Pain Score 5    Pain Location Knee   Pain Orientation Right;Left   Pain Descriptors / Indicators Sore   Pain Type Chronic pain   Pain Onset More than a month ago   Pain Frequency Intermittent   Aggravating Factors  walking   Pain Relieving Factors rubbing a suave on her back   Effect of Pain on Daily Activities yard work              [employer] Adult PT Treatment/Exercise - 02/15/17 0001      Lumbar Exercises: Standing   Heel Raises 20 reps   Heel Raises Limitations toe and heel raises    Forward Lunge 10 reps   Forward Lunge Limitations $" box throughout     Lumbar Exercises: Seated   Sit to Stand 5 reps   Sit to Stand Limitations 2 sets with RTB around knees         Balance Exercises - 02/15/17 1325      Balance Exercises: Standing   SLS Eyes open;Solid surface;5 reps;10 secs   Rockerboard Lateral  x 1 min, no UE support   Tandem Gait Forward;2 reps   Other Standing  Exercises bil tandem stance and OH lifts and trunk rotation with 2# weight bar x 10 reps each             PT Education - 02/15/17 1345    Education provided Yes   Education Details exercise technqiue   Person(s) Educated Patient   Methods Demonstration;Explanation   Comprehension Verbalized understanding;Returned demonstration          PT Short Term Goals - 01/19/17 1213      PT SHORT TERM GOAL #1   Title Pt Right leg pain to be no greater than a 4/10 to allow pt to walk for up to fifteen  minutes to be able to complete short shopping trips with increased ease.    Time 3   Period Weeks   Status New     PT SHORT TERM GOAL #2   Title Pt SI to be aligned to decrease right leg pain    Time 3   Period Weeks   Status New     PT SHORT TERM GOAL #3   Title Pt to be able to demonstrate good body mechanics to get in and out of bed, roll and sit to stand for decreased stress on low back area.    Time 3    Period Weeks   Status New           PT Long Term Goals - 01/19/17 1215      PT LONG TERM GOAL #1   Title PT Rt leg pain to be no greater than a 2/10 to allow pt to be able to be on her feet for 30 mintues to complete household chores.    Time 6   Period Weeks   Status New     PT LONG TERM GOAL #2   Title Pt to demonstrate proper body mechanics for mopping and vacuuming to keep stress off of her low back.    Time 6   Period Weeks   Status New     PT LONG TERM GOAL #3   Title Pt LE strength to increase by one grade so that pt is able to go up and down her steps in a reciprocal manner with confidence    Time 6   Period Weeks   Status New               Plan - 02/15/17 1345    Clinical Impression Statement Session focused on BLE strengthening and balance this date. Pt tolerated well but did demo fatigue. Pt had the most difficulty with RLE throughout balance activities. Continue POC as planned.   Rehab Potential Good   PT Frequency 2x / week   PT Duration 6 weeks   PT Treatment/Interventions ADLs/Self Care Home Management;Moist Heat;Ultrasound;Functional mobility training;Therapeutic activities;Therapeutic exercise;Balance training;Patient/family education;Manual techniques   PT Next Visit Plan Check SI and adjust PRN.  Progress balance, SLS with vectors, fwd/retro tandem gait, balance with EC and on compliant surfaces; progress therex per pt's tolerance   PT Home Exercise Plan eval:  SKTC, hamstring stretch 5/8:  decompression exercises 1-5   Consulted and Agree with Plan of Care Patient      Patient will benefit from skilled therapeutic intervention in order to improve the following deficits and impairments:  Decreased activity tolerance, Decreased balance, Decreased strength, Difficulty walking, Impaired flexibility, Pain, Postural dysfunction  Visit Diagnosis: Radiculopathy, lumbar region  Muscle weakness (generalized)  Abnormal posture     Problem  List Patient Active Problem List  Diagnosis Date Noted  . Perceived hearing changes 10/04/2016  . Headache syndrome 10/04/2016  . Hereditary and idiopathic peripheral neuropathy 10/04/2016  . Total knee replacement status 05/21/2015  . Arthritis of knee, degenerative 05/21/2015  . Arthritis of left knee   . Osteoarthritis of right knee 02/14/2014  . Hip pain, chronic 05/16/2013  . Right knee pain 05/16/2013  . Radicular leg pain 05/16/2013  . Scoliosis of lumbar spine 05/16/2013  . Colon cancer screening 01/13/2011  . Microcytic anemia 09/03/2010  . WEIGHT LOSS 06/02/2010  . HYPERLIPIDEMIA 07/01/2009  . Essential hypertension 07/01/2009  . GERD 12/04/2008  . IBS 12/04/2008     Geraldine Solar PT, DPT   [facility] [address] [place], [place], [address] Phone: [phone]   Fax:  [phone]  Name: Katherine Joseph MRN: [id number] Date of Birth: Aug 15, 1935

## 2017-02-17 ENCOUNTER — Ambulatory Visit (HOSPITAL_COMMUNITY): Payer: PPO

## 2017-02-17 DIAGNOSIS — M6281 Muscle weakness (generalized): Secondary | ICD-10-CM

## 2017-02-17 DIAGNOSIS — R293 Abnormal posture: Secondary | ICD-10-CM

## 2017-02-17 DIAGNOSIS — M5416 Radiculopathy, lumbar region: Secondary | ICD-10-CM

## 2017-02-17 NOTE — Therapy (Addendum)
[facility] [address] [place], [place], [address] Phone: [phone]   Fax:  [phone]  Physical Therapy Treatment  Patient Details  Name: Katherine Joseph MRN: [id number] Date of Birth: 12/21/1934 Referring Provider: Arther Abbott  Encounter Date: 02/17/2017      PT End of Session - 02/17/17 1037    Visit Number 9   Number of Visits 12   Date for PT Re-Evaluation 02/18/17   Authorization - Visit Number 9   Authorization - Number of Visits 10   PT Start Time 1962   PT Stop Time 1113   PT Time Calculation (min) 40 min   Activity Tolerance Patient tolerated treatment well;No increased pain   Behavior During Therapy WFL for tasks assessed/performed      Past Medical History:  Diagnosis Date  . Fluttering muscles SEP 2009   MBS/BASW NL  . GERD (gastroesophageal reflux disease)   . Hemorrhoids, internal OCT 2011  . Hereditary and idiopathic peripheral neuropathy 10/04/2016  . Hyperlipidemia   . Hypertension   . IBS (irritable bowel syndrome)     Past Surgical History:  Procedure Laterality Date  . COLONOSCOPY  2004   RMR/NUR INFLAMMATORY POLYP, POST POLYPECTOMY BLEED  . COLONOSCOPY  oct 2011 SLF nvd-WEIGHT LOSS   NL TI, [place]/DC TICS  . ESOPHAGOGASTRODUODENOSCOPY  2006   W/DILATION W/RMR  . KNEE ARTHROSCOPY    . RIGHT FOOT SURGERY    . TOTAL KNEE ARTHROPLASTY Left 05/21/2015   Procedure: TOTAL KNEE ARTHROPLASTY;  Surgeon: Carole Civil, MD;  Location: [facility];  Service: Orthopedics;  Laterality: Left;    There were no vitals filed for this visit.      Subjective Assessment - 02/17/17 1036    Subjective Pt stated her back is feeling good, does c/o Rt knee pain today    Pertinent History HTN, OA   Patient Stated Goals Pt wants to feel better but she doesnt think she can get rid of all of her pain.     Currently in Pain? Yes   Pain Score 5    Pain Location Knee   Pain Orientation Right   Pain Descriptors /  Indicators Sharp;Aching   Pain Type Chronic pain   Pain Radiating Towards Rt knee   Pain Onset More than a month ago   Pain Frequency Intermittent   Aggravating Factors  walking   Pain Relieving Factors rubbing a suave on her back   Effect of Pain on Daily Activities yard work            [facility] PT Assessment - 02/17/17 0001      Assessment   Medical Diagnosis  Rt radicular back pain   Referring Provider Arther Abbott   Onset Date/Surgical Date 10/04/16   Hand Dominance Right   Next MD Visit 02/21/2017   Prior Therapy for knees      Precautions   Precautions None                     [facility] Adult PT Treatment/Exercise - 02/17/17 0001      Lumbar Exercises: Standing   Heel Raises 20 reps   Heel Raises Limitations toe and heel raises    Functional Squats 10 reps   Forward Lunge 15 reps   Forward Lunge Limitations 4in step     Manual Therapy   Manual Therapy Other (comment)   Muscle Energy Technique SI intact, no MET required  Balance Exercises - 02/17/17 1102      Balance Exercises: Standing   Tandem Stance 3 reps;30 secs  tandem stance on foam with 2# lifting overhead 10x each   SLS 3 reps  Lt 31", Rt 46" max of 3   SLS with Vectors Solid surface;Intermittent upper extremity assist;3 reps  3x 5" BLE intermittent HHA   Tandem Gait Forward;Retro;2 reps   Sidestepping 2 reps   Other Standing Exercises bil tandem stance and OH lifts and trunk rotation with 2# weight bar x 10 reps each             PT Short Term Goals - 01/19/17 1213      PT SHORT TERM GOAL #1   Title Pt Right leg pain to be no greater than a 4/10 to allow pt to walk for up to fifteen  minutes to be able to complete short shopping trips with increased ease.    Time 3   Period Weeks   Status met     PT SHORT TERM GOAL #2   Title Pt SI to be aligned to decrease right leg pain    Time 3   Period Weeks   Status met     PT SHORT TERM GOAL #3   Title Pt to be  able to demonstrate good body mechanics to get in and out of bed, roll and sit to stand for decreased stress on low back area.    Time 3   Period Weeks   Status met           PT Long Term Goals - 01/19/17 1215      PT LONG TERM GOAL #1   Title PT Rt leg pain to be no greater than a 2/10 to allow pt to be able to be on her feet for 30 mintues to complete household chores.    Time 6   Period Weeks   Status met     PT LONG TERM GOAL #2   Title Pt to demonstrate proper body mechanics for mopping and vacuuming to keep stress off of her low back.    Time 6   Period Weeks   Status On going      PT LONG TERM GOAL #3   Title Pt LE strength to increase by one grade so that pt is able to go up and down her steps in a reciprocal manner with confidence    Time 6   Period Weeks   Status On going                Plan - 02/17/17 1115    Clinical Impression Statement Continued session focus with BLE strengthening and progressed balance activities.  Added retro tandem gait and vector stance for balance and hip stabilty.  Pt able to complete whole session with no reports of increased pain.  Min A and increased demonstration required with new activities for proper form and safety.   Rehab Potential Good   PT Frequency 2x / week   PT Duration 6 weeks   PT Treatment/Interventions ADLs/Self Care Home Management;Moist Heat;Ultrasound;Functional mobility training;Therapeutic activities;Therapeutic exercise;Balance training;Patient/family education;Manual techniques   PT Next Visit Plan Re-eval next session.        Patient will benefit from skilled therapeutic intervention in order to improve the following deficits and impairments:  Decreased activity tolerance, Decreased balance, Decreased strength, Difficulty walking, Impaired flexibility, Pain, Postural dysfunction  Visit Diagnosis: Radiculopathy, lumbar region  Muscle weakness (generalized)  Abnormal posture  Problem  List Patient Active Problem List   Diagnosis Date Noted  . Perceived hearing changes 10/04/2016  . Headache syndrome 10/04/2016  . Hereditary and idiopathic peripheral neuropathy 10/04/2016  . Total knee replacement status 05/21/2015  . Arthritis of knee, degenerative 05/21/2015  . Arthritis of left knee   . Osteoarthritis of right knee 02/14/2014  . Hip pain, chronic 05/16/2013  . Right knee pain 05/16/2013  . Radicular leg pain 05/16/2013  . Scoliosis of lumbar spine 05/16/2013  . Colon cancer screening 01/13/2011  . Microcytic anemia 09/03/2010  . WEIGHT LOSS 06/02/2010  . HYPERLIPIDEMIA 07/01/2009  . Essential hypertension 07/01/2009  . GERD 12/04/2008  . IBS 12/04/2008   Ihor Austin, LPTA; [place]  Rayetta Humphrey, PT CLT [phone] 02/17/2017, 11:21 AM  [facility] [address] [place], [place], [address] Phone: [phone]   Fax:  [phone]  Name: TANEISHA FUSON MRN: [id number] Date of Birth: October 10, 1934   02/22/2017  PHYSICAL THERAPY DISCHARGE SUMMARY  Visits from Start of Care: 9  Current functional level related to goals / functional outcomes: See above:  Pt wen back to MD and was discharged from therapy therefore no  Formal reassessment was completed    Remaining deficits: See above   Education / Equipment: Body mechanics. Plan: Patient agrees to discharge.  Patient goals were partially met. Patient is being discharged due to being pleased with the current functional level.  ?????       Rayetta Humphrey, [place] CLT [phone]

## 2017-02-21 ENCOUNTER — Ambulatory Visit (INDEPENDENT_AMBULATORY_CARE_PROVIDER_SITE_OTHER): Payer: PPO | Admitting: Orthopedic Surgery

## 2017-02-21 ENCOUNTER — Encounter: Payer: Self-pay | Admitting: Orthopedic Surgery

## 2017-02-21 ENCOUNTER — Telehealth (HOSPITAL_COMMUNITY): Payer: Self-pay | Admitting: Internal Medicine

## 2017-02-21 DIAGNOSIS — M545 Low back pain: Secondary | ICD-10-CM | POA: Diagnosis not present

## 2017-02-21 MED ORDER — METHYLPREDNISOLONE ACETATE 40 MG/ML IJ SUSP
40.0000 mg | Freq: Once | INTRAMUSCULAR | Status: AC
Start: 2017-02-21 — End: 2017-02-21
  Administered 2017-02-21: 40 mg via INTRAMUSCULAR

## 2017-02-21 NOTE — Progress Notes (Signed)
Patient ID: Katherine Joseph, female   DOB: [birth year], 82 y.o.   MRN: [id number]  Chief Complaint  Patient presents with  . Follow-up    LOW BACK PAIN S/P THERAPY    82 year old female was sent to physical therapy for degenerative disc disease with sciatica. She says it did not help. She says she no longer has money to go to therapy and she is happy to live with what she has    Review of Systems  Neurological: Positive for tingling.      There were no vitals taken for this visit. Gen. appearance is normal grooming and hygiene Orientation to person place and time normal Mood normal Gait is normal  Sensory exam shows Decreased sensation in the dorsum of the right leg and top of the right foot Skin exam no lacerations ulcerations or erythema  Ortho Exam   A/P  Medical decision-making  I gave the patient an IM shot in her right hip to see if the steroids could help with the radicular symptoms. She will continue gabapentin Cataflam  IM SHOT ADMINISTERED BY THE NURSE  IM SHOT right hip   Medication  Depo-Medrol 40  MG   Lidocaine 1% 3 mL  Verbal consent was obtained   timeout was executed to confirm injection site  Alcohol and ethyl chloride were used and the nurse injected the right  hip   Arther Abbott, MD 02/21/2017 3:47 PM

## 2017-02-21 NOTE — Telephone Encounter (Signed)
02/21/17 pt called and said that she just saw Dr. Aline Brochure and he told her she didn't need anymore therapy

## 2017-02-22 ENCOUNTER — Ambulatory Visit (HOSPITAL_COMMUNITY): Payer: PPO | Admitting: Physical Therapy

## 2017-02-24 ENCOUNTER — Encounter (HOSPITAL_COMMUNITY): Payer: PPO | Admitting: Physical Therapy

## 2017-04-25 DIAGNOSIS — E785 Hyperlipidemia, unspecified: Secondary | ICD-10-CM | POA: Diagnosis not present

## 2017-04-25 DIAGNOSIS — I10 Essential (primary) hypertension: Secondary | ICD-10-CM | POA: Diagnosis not present

## 2017-04-25 DIAGNOSIS — R1900 Intra-abdominal and pelvic swelling, mass and lump, unspecified site: Secondary | ICD-10-CM | POA: Diagnosis not present

## 2017-05-02 DIAGNOSIS — E559 Vitamin D deficiency, unspecified: Secondary | ICD-10-CM | POA: Diagnosis not present

## 2017-05-02 DIAGNOSIS — I10 Essential (primary) hypertension: Secondary | ICD-10-CM | POA: Diagnosis not present

## 2017-05-02 DIAGNOSIS — E785 Hyperlipidemia, unspecified: Secondary | ICD-10-CM | POA: Diagnosis not present

## 2017-05-17 ENCOUNTER — Ambulatory Visit: Payer: PPO | Admitting: Orthopedic Surgery

## 2017-05-17 ENCOUNTER — Other Ambulatory Visit (HOSPITAL_COMMUNITY): Payer: Self-pay | Admitting: Internal Medicine

## 2017-05-17 DIAGNOSIS — R1900 Intra-abdominal and pelvic swelling, mass and lump, unspecified site: Secondary | ICD-10-CM

## 2017-05-18 ENCOUNTER — Ambulatory Visit (INDEPENDENT_AMBULATORY_CARE_PROVIDER_SITE_OTHER): Payer: PPO | Admitting: Orthopedic Surgery

## 2017-05-18 ENCOUNTER — Ambulatory Visit (INDEPENDENT_AMBULATORY_CARE_PROVIDER_SITE_OTHER): Payer: PPO

## 2017-05-18 ENCOUNTER — Ambulatory Visit: Payer: PPO | Admitting: Orthopedic Surgery

## 2017-05-18 DIAGNOSIS — Z96652 Presence of left artificial knee joint: Secondary | ICD-10-CM

## 2017-05-18 NOTE — Progress Notes (Signed)
ANNUAL FOLLOW UP FOR  LEFT TKA   Chief Complaint  Patient presents with  . Follow-up    Left total knee 2016    HPI: The patient is here for the annual  follow-up x-ray for knee replacement  She says her left knee feels fine other than some numbness lateral to the incision. She does have a dull mild constant aching pain in the right knee but she is afraid to do the surgery.  Review of Systems  Musculoskeletal: Positive for joint pain. Negative for back pain.       RIGHT KNEE JOINT   Neurological: Positive for sensory change.       LATERAL KNEE     Past Medical History:  Diagnosis Date  . Fluttering muscles SEP 2009   MBS/BASW NL  . GERD (gastroesophageal reflux disease)   . Hemorrhoids, internal OCT 2011  . Hereditary and idiopathic peripheral neuropathy 10/04/2016  . Hyperlipidemia   . Hypertension   . IBS (irritable bowel syndrome)    Physical Exam  Constitutional: She is oriented to person, place, and time. She appears well-developed and well-nourished. No distress.  Cardiovascular: Normal rate and intact distal pulses.   Neurological: She is alert and oriented to person, place, and time. She has normal reflexes. She exhibits normal muscle tone. Coordination normal.  Skin: Skin is warm and dry. No rash noted. She is not diaphoretic. No erythema. No pallor.  Psychiatric: She has a normal mood and affect. Her behavior is normal. Judgment and thought content normal.    Examination of the Left KNEE   Inspection shows : incision healed nicely without erythema, no tenderness no swelling  Range of motion total range of motion is 120  Stability the knee is stable anterior to posterior as well as medial to lateral  Strength quadriceps strength is normal  Skin no erythema around the skin incision  Cardiovascular NO EDEMA   Right knee mild malalignment, stability normal motor exam normal  Gait is normal with a cane in the left hand Medical decision-making  section  X-rays ordered with the following personal interpretation  Normal alignment without loosening   Diagnosis  Encounter Diagnosis  Name Primary?  . Status post left knee replacement Yes     Plan follow-up 1 year repeat x-rays

## 2017-05-20 ENCOUNTER — Ambulatory Visit (HOSPITAL_COMMUNITY)
Admission: RE | Admit: 2017-05-20 | Discharge: 2017-05-20 | Disposition: A | Discharge status: Home / Self Care | Payer: PPO | Source: Ambulatory Visit | Attending: Internal Medicine | Admitting: Internal Medicine

## 2017-05-20 DIAGNOSIS — N83201 Unspecified ovarian cyst, right side: Secondary | ICD-10-CM | POA: Insufficient documentation

## 2017-05-20 DIAGNOSIS — R1900 Intra-abdominal and pelvic swelling, mass and lump, unspecified site: Secondary | ICD-10-CM | POA: Diagnosis not present

## 2017-05-20 DIAGNOSIS — K5730 Diverticulosis of large intestine without perforation or abscess without bleeding: Secondary | ICD-10-CM | POA: Insufficient documentation

## 2017-07-27 DIAGNOSIS — I10 Essential (primary) hypertension: Secondary | ICD-10-CM | POA: Diagnosis not present

## 2017-07-27 DIAGNOSIS — E785 Hyperlipidemia, unspecified: Secondary | ICD-10-CM | POA: Diagnosis not present

## 2017-07-27 DIAGNOSIS — N83209 Unspecified ovarian cyst, unspecified side: Secondary | ICD-10-CM | POA: Diagnosis not present

## 2017-07-27 DIAGNOSIS — G629 Polyneuropathy, unspecified: Secondary | ICD-10-CM | POA: Diagnosis not present

## 2017-08-01 ENCOUNTER — Encounter: Payer: Self-pay | Admitting: Gastroenterology

## 2017-08-02 ENCOUNTER — Other Ambulatory Visit (HOSPITAL_COMMUNITY): Payer: Self-pay | Admitting: Internal Medicine

## 2017-08-02 DIAGNOSIS — N83201 Unspecified ovarian cyst, right side: Secondary | ICD-10-CM

## 2017-08-03 ENCOUNTER — Encounter: Payer: Self-pay | Admitting: Gastroenterology

## 2017-08-04 ENCOUNTER — Other Ambulatory Visit (HOSPITAL_COMMUNITY): Payer: Self-pay | Admitting: Internal Medicine

## 2017-08-04 DIAGNOSIS — Z1231 Encounter for screening mammogram for malignant neoplasm of breast: Secondary | ICD-10-CM

## 2017-08-08 ENCOUNTER — Ambulatory Visit (HOSPITAL_COMMUNITY)
Admission: RE | Admit: 2017-08-08 | Discharge: 2017-08-08 | Disposition: A | Discharge status: Home / Self Care | Payer: PPO | Source: Ambulatory Visit | Attending: Internal Medicine | Admitting: Internal Medicine

## 2017-08-08 DIAGNOSIS — Z780 Asymptomatic menopausal state: Secondary | ICD-10-CM | POA: Insufficient documentation

## 2017-08-08 DIAGNOSIS — R9389 Abnormal findings on diagnostic imaging of other specified body structures: Secondary | ICD-10-CM | POA: Insufficient documentation

## 2017-08-08 DIAGNOSIS — N83201 Unspecified ovarian cyst, right side: Secondary | ICD-10-CM | POA: Diagnosis not present

## 2017-08-08 DIAGNOSIS — N83292 Other ovarian cyst, left side: Secondary | ICD-10-CM | POA: Diagnosis not present

## 2017-08-17 ENCOUNTER — Ambulatory Visit: Payer: PPO | Admitting: Obstetrics and Gynecology

## 2017-08-17 ENCOUNTER — Other Ambulatory Visit: Payer: Self-pay | Admitting: Obstetrics and Gynecology

## 2017-08-17 ENCOUNTER — Other Ambulatory Visit: Payer: Self-pay

## 2017-08-17 ENCOUNTER — Encounter: Payer: Self-pay | Admitting: Obstetrics and Gynecology

## 2017-08-17 VITALS — BP 140/84 | HR 80 | Ht 69.0 in | Wt 194.0 lb

## 2017-08-17 DIAGNOSIS — R9389 Abnormal findings on diagnostic imaging of other specified body structures: Secondary | ICD-10-CM

## 2017-08-17 DIAGNOSIS — N8500 Endometrial hyperplasia, unspecified: Secondary | ICD-10-CM | POA: Diagnosis not present

## 2017-08-17 DIAGNOSIS — N858 Other specified noninflammatory disorders of uterus: Secondary | ICD-10-CM | POA: Diagnosis not present

## 2017-08-17 NOTE — Progress Notes (Signed)
Patient ID: OLLA DELANCEY, female   DOB: July 22, 1935, 82 y.o.   MRN: [id number]   [facility] Visit  @DATE @            Patient name: Katherine Joseph MRN [id number]  Date of birth: 10-24-1934  CC & HPI:  Katherine Joseph is a 82 y.o. female presenting today for an endometrial biopsy. On 08/08/2017 she had a transvaginal U/S that showed a thickened endometrium. She was referred to our office by Doree Albee, MD. She denies any other symptoms or complaints at this time.  ROS:  ROS +thickened endometrium All systems are negative except as noted in the HPI and PMH.   Pertinent History Reviewed:   Reviewed: Significant for internal hemorrhoids and IBS Medical         Past Medical History:  Diagnosis Date  . Fluttering muscles SEP 2009   MBS/BASW NL  . GERD (gastroesophageal reflux disease)   . Hemorrhoids, internal OCT 2011  . Hereditary and idiopathic peripheral neuropathy 10/04/2016  . Hyperlipidemia   . Hypertension   . IBS (irritable bowel syndrome)                               Surgical Hx:    Past Surgical History:  Procedure Laterality Date  . COLONOSCOPY  2004   RMR/NUR INFLAMMATORY POLYP, POST POLYPECTOMY BLEED  . COLONOSCOPY  oct 2011 SLF nvd-WEIGHT LOSS   NL TI, [place]/DC TICS  . ESOPHAGOGASTRODUODENOSCOPY  2006   W/DILATION W/RMR  . KNEE ARTHROSCOPY    . RIGHT FOOT SURGERY    . TOTAL KNEE ARTHROPLASTY Left 05/21/2015   Procedure: TOTAL KNEE ARTHROPLASTY;  Surgeon: Carole Civil, MD;  Location: [facility];  Service: Orthopedics;  Laterality: Left;   Medications: Reviewed & Updated - see associated section                       Current Outpatient Medications:  .  aspirin 81 MG tablet, Take 81 mg by mouth every morning. , Disp: , Rfl:  .  divalproex (DEPAKOTE) 250 MG 24 hr tablet, Take 750 mg by mouth at bedtime. , Disp: , Rfl:  .  DULoxetine (CYMBALTA) 30 MG capsule, Take 60 mg by mouth daily., Disp: , Rfl:  .  gabapentin (NEURONTIN) 300 MG capsule, Take  300 mg by mouth 3 (three) times daily., Disp: , Rfl:  .  lisinopril-hydrochlorothiazide (PRINZIDE,ZESTORETIC) 20-25 MG tablet, , Disp: , Rfl:  .  metoprolol tartrate (LOPRESSOR) 25 MG tablet, Take 12.5 mg by mouth 2 (two) times daily. , Disp: , Rfl:  .  polyethylene glycol (MIRALAX / GLYCOLAX) packet, Take 17 g by mouth daily as needed., Disp: , Rfl:  .  potassium chloride SA (KLOR-CON M20) 20 MEQ tablet, Take 20 mEq by mouth daily.  , Disp: , Rfl:  .  torsemide (DEMADEX) 20 MG tablet, Take 20 mg by mouth daily. Does reduction, Disp: , Rfl:  .  atorvastatin (LIPITOR) 20 MG tablet, Take 20 mg by mouth every evening. , Disp: , Rfl:  .  diclofenac (CATAFLAM) 50 MG tablet, Take 1 tablet (50 mg total) by mouth 2 (two) times daily. (Patient not taking: Reported on 08/17/2017), Disp: 60 tablet, Rfl: 0 .  divalproex (DEPAKOTE) 250 MG DR tablet, , Disp: , Rfl:  .  LORazepam (ATIVAN) 1 MG tablet, Take 1 mg by mouth every 6 (six) hours as  needed for anxiety., Disp: , Rfl:  .  omeprazole (PRILOSEC) 20 MG capsule, Take 1 capsule (20 mg total) by mouth daily. (Patient not taking: Reported on 08/17/2017), Disp: 31 capsule, Rfl: 11   Social History: Reviewed -  reports that  has never smoked. Her smokeless tobacco use includes snuff.  Objective Findings:  Vitals: Blood pressure 140/84, pulse 80, height 5\' 9"  (1.753 m), weight 194 lb (88 kg).  PHYSICAL EXAMINATION General appearance - alert, well appearing, and in no distress and oriented to person, place, and time Mental status - alert, oriented to person, place, and time, normal mood, behavior, speech, dress, motor activity, and thought processes, affect appropriate to mood PELVIC External genitalia - normal Vagina - atrophic Cervix - multiparous  Uterus - anterior  PROCEDURE Endometrial Biopsy: Patient given informed consent, signed copy in the chart, time out was performed. Time out taken. The patient was placed in the lithotomy position and the  cervix brought into view with sterile speculum.  Portio of cervix cleansed x 2 with betadine swabs.  A tenaculum was placed in the anterior lip of the cervix. The uterus was sounded for depth of 8 cm,. Milex uterine Explora 3 mm was introduced to into the uterus, suction created,  and an endometrial sample was obtained. All equipment was removed and accounted for.   The patient tolerated the procedure well.    Patient given post procedure instructions.    Assessment & Plan:   A:  1. Endometrial thickening, now status post endometrial biopsy  P:  1. Will call with results of endometrial biopsy  By signing my name below, I, Margit Banda, attest that this documentation has been prepared under the direction and in the presence of Jonnie Kind, MD. Electronically Signed: Margit Banda, Medical Scribe. 08/17/17. 12:10 PM.  I personally performed the services described in this documentation, which was SCRIBED in my presence. The recorded information has been reviewed and considered accurate. It has been edited as necessary during review. Jonnie Kind, MD

## 2017-08-19 ENCOUNTER — Telehealth: Payer: Self-pay | Admitting: Obstetrics and Gynecology

## 2017-08-19 ENCOUNTER — Telehealth: Payer: Self-pay | Admitting: *Deleted

## 2017-08-19 NOTE — Telephone Encounter (Signed)
Patient called stating she missed Dr Johnnye Sima call. Informed patient of his previous message: "Pathology report is returned showing atrophic endometrium no cancerous or precancerous changes were noted I have called Ms. Sheran Spine and left a message with her reassuring her of the normal results.We really do not need to do any more testing unless she develops postmenopausal bleeding". Patient verbalized understanding with no further questions.

## 2017-08-19 NOTE — Telephone Encounter (Signed)
Pathology report is returned showing atrophic endometrium no cancerous or precancerous changes were noted I have called Ms. Sheran Spine and left a message with her reassuring her of the normal results. We really do not need to do any more testing unless she develops postmenopausal bleeding

## 2017-09-07 ENCOUNTER — Ambulatory Visit (HOSPITAL_COMMUNITY)
Admission: RE | Admit: 2017-09-07 | Discharge: 2017-09-07 | Disposition: A | Discharge status: Home / Self Care | Payer: PPO | Source: Ambulatory Visit | Attending: Internal Medicine | Admitting: Internal Medicine

## 2017-09-07 DIAGNOSIS — Z1231 Encounter for screening mammogram for malignant neoplasm of breast: Secondary | ICD-10-CM | POA: Insufficient documentation

## 2017-10-26 DIAGNOSIS — H524 Presbyopia: Secondary | ICD-10-CM | POA: Diagnosis not present

## 2017-10-31 DIAGNOSIS — G629 Polyneuropathy, unspecified: Secondary | ICD-10-CM | POA: Diagnosis not present

## 2017-10-31 DIAGNOSIS — I10 Essential (primary) hypertension: Secondary | ICD-10-CM | POA: Diagnosis not present

## 2017-10-31 DIAGNOSIS — E785 Hyperlipidemia, unspecified: Secondary | ICD-10-CM | POA: Diagnosis not present

## 2018-01-04 ENCOUNTER — Encounter: Payer: Self-pay | Admitting: Gastroenterology

## 2018-01-04 ENCOUNTER — Ambulatory Visit (INDEPENDENT_AMBULATORY_CARE_PROVIDER_SITE_OTHER): Payer: PPO | Admitting: Gastroenterology

## 2018-01-04 DIAGNOSIS — K219 Gastro-esophageal reflux disease without esophagitis: Secondary | ICD-10-CM

## 2018-01-04 DIAGNOSIS — K581 Irritable bowel syndrome with constipation: Secondary | ICD-10-CM | POA: Diagnosis not present

## 2018-01-04 DIAGNOSIS — R1013 Epigastric pain: Secondary | ICD-10-CM | POA: Insufficient documentation

## 2018-01-04 NOTE — Progress Notes (Signed)
Subjective:    Patient ID: Katherine Joseph, female    DOB: 09/15/1935, 83 y.o.   MRN: [id number] Doree Albee, MD   HPI Pt doesn't think she weighs 200 lbs. Last seen DEC 2017 WEIGHED 183 LBS. CAN'T GET CLOTHES ON UNLESS WAIST HAS ELASTIC BAND. RECENT WORKUP FOR GU/GYN PROBLEMS. HAVING TROUBLE WITH BACK PAIN. HAS AN APPT WITH PCP IN MAY 2017. WAS OUTSIDE DOING YARD [facility]). BOWEL REGULAR. CAN OCCASIONALLY HAS BURNING IN HER BACK: WHEN SHE'S DOING SOMETHING SHE CAN FEEL BACK PAIN EVERY DAY.  PT DENIES FEVER, CHILLS, HEMATOCHEZIA, nausea, vomiting, melena, diarrhea, CHEST PAIN, SHORTNESS OF BREATH, CHANGE IN BOWEL IN HABITS, constipation, abdominal pain, problems swallowing, OR heartburn or indigestion.  Past Medical History:  Diagnosis Date  . Fluttering muscles SEP 2009   MBS/BASW NL  . GERD (gastroesophageal reflux disease)   . Hemorrhoids, internal OCT 2011  . Hereditary and idiopathic peripheral neuropathy 10/04/2016  . Hyperlipidemia   . Hypertension   . IBS (irritable bowel syndrome)    Past Surgical History:  Procedure Laterality Date  . COLONOSCOPY  2004   RMR/NUR INFLAMMATORY POLYP, POST POLYPECTOMY BLEED  . COLONOSCOPY  oct 2011 SLF nvd-WEIGHT LOSS   NL TI, [place]/DC TICS  . ESOPHAGOGASTRODUODENOSCOPY  2006   W/DILATION W/RMR  . KNEE ARTHROSCOPY    . RIGHT FOOT SURGERY    . TOTAL KNEE ARTHROPLASTY Left 05/21/2015   Procedure: TOTAL KNEE ARTHROPLASTY;  Surgeon: Carole Civil, MD;  Location: [facility];  Service: Orthopedics;  Laterality: Left;   Allergies  Allergen Reactions  . Sulfa Antibiotics Hives  . Iodinated Diagnostic Agents Rash    Current Outpatient Medications  Medication Sig Dispense Refill  . aspirin 81 MG tablet Take 81 mg by mouth every morning.     Marland Kitchen atorvastatin (LIPITOR) 20 MG tablet Take 20 mg by mouth every evening.     . divalproex (DEPAKOTE) 250 MG 24 hr tablet Take 750 mg by mouth at bedtime.     . DULoxetine (CYMBALTA) 30 MG capsule  Take 60 mg by mouth daily.    Marland Kitchen gabapentin (NEURONTIN) 300 MG capsule Take 300 mg by mouth at bedtime.     Marland Kitchen lisinopril-hydrochlorothiazide (PRINZIDE,ZESTORETIC) 20-25 MG tablet     . LORazepam (ATIVAN) 1 MG tablet Take 1 mg by mouth every 6 (six) hours as needed for anxiety.    . metoprolol tartrate (LOPRESSOR) 25 MG tablet Take 12.5 mg by mouth 2 (two) times daily.     Marland Kitchen omeprazole (PRILOSEC) 20 MG capsule Take 1 capsule (20 mg total) by mouth daily.    . polyethylene glycol (MIRALAX / GLYCOLAX) packet Take 17 g by mouth daily as needed.    . potassium chloride SA (KLOR-CON M20) 20 MEQ tablet Take 20 mEq by mouth daily.      Marland Kitchen torsemide (DEMADEX) 20 MG tablet Take 20 mg by mouth daily. Does reduction    .      . divalproex (DEPAKOTE) 250 MG DR tablet      Review of Systems PER HPI OTHERWISE ALL SYSTEMS ARE NEGATIVE.    Objective:   Physical Exam  Constitutional: She is oriented to person, place, and time. She appears well-developed and well-nourished. No distress.  HENT:  Head: Normocephalic and atraumatic.  Mouth/Throat: Oropharynx is clear and moist. No oropharyngeal exudate.  Eyes: Pupils are equal, round, and reactive to light. No scleral icterus.  Neck: Normal range of motion. Neck supple.  Cardiovascular: Normal rate, regular  rhythm and normal heart sounds.  Pulmonary/Chest: Effort normal and breath sounds normal. No respiratory distress.  Abdominal: Soft. Bowel sounds are normal. She exhibits no distension. There is no tenderness.  Musculoskeletal: She exhibits no edema.  Lymphadenopathy:    She has no cervical adenopathy.  Neurological: She is alert and oriented to person, place, and time.  NO  NEW FOCAL DEFICITS  Psychiatric: She has a normal mood and affect.  Vitals reviewed.     Assessment & Plan:

## 2018-01-04 NOTE — Assessment & Plan Note (Signed)
SYMPTOMS CONTROLLED/RESOLVED.  CONTINUE TO MONITOR SYMPTOMS. MIRALAX PRN. FOLLOW UP IN 1 YEAR.

## 2018-01-04 NOTE — Assessment & Plan Note (Signed)
SYMPTOMS NOT IDEALLY CONTROLLED AND EXACERBATED BY DIET AND WEIGHT GAIN.  DRINK WATER TO KEEP YOUR URINE LIGHT YELLOW. LOSE 5 LBS. AVOID ITEMS THAT CAUSE BLOATING & GAS.  HANDOUT GIVEN. TO REDUCE BLOATING, Do not drink DIET SODA OR REGULAR SODA, AVOID HIGH FRUCTOSE CORN SYRUP, chew SUGAR FREE GUM, OR USE ARTIFICIAL SWEETENERS.   CONTINUE NEXIUM. TAKE 30 MINUTES BEFORE FIRST MEAL.  FOLLOW UP IN 1 YEAR.

## 2018-01-04 NOTE — Progress Notes (Signed)
CC'ED TO PCP 

## 2018-01-04 NOTE — Assessment & Plan Note (Signed)
SYMPTOMS CONTROLLED/RESOLVED.  CONTINUE TO MONITOR SYMPTOMS. DRINK WATER TO KEEP YOUR URINE LIGHT YELLOW. LOSE 5 LBS. CONTINUE NEXIUM. TAKE 30 MINUTES BEFORE FIRST MEAL.  FOLLOW UP IN 1 YEAR.

## 2018-01-04 NOTE — Patient Instructions (Signed)
DRINK WATER TO KEEP YOUR URINE LIGHT YELLOW.   AVOID ITEMS THAT CAUSE BLOATING & GAS. See info below.  TO REDUCE BLOATING, Do not drink DIET SODA OR REGULAR SODA, AVOID HIGH FRUCTOSE CORN SYRUP, chew SUGAR FREE GUM, OR USE ARTIFICIAL SWEETENERS.    CONTINUE NEXIUM. TAKE 30 MINUTES BEFORE FIRST MEAL.  FOLLOW UP IN 1 YEAR.    BLOATING AND GAS PREVENTION  Although gas may be uncomfortable and embarrassing, it is not life-threatening. Understanding causes, ways to reduce symptoms, and treatment will help most people find some relief.  Points to remember . Everyone has gas in the digestive tract. Marland Kitchen People often believe normal passage of gas to be excessive.  . Gas comes from two main sources: swallowed air and normal breakdown of certain foods by harmless bacteria naturally present in the large intestine.  . Many foods with carbohydrates can cause gas. Fats and proteins cause little gas.  . Foods that may cause gas include o beans  o vegetables, such as broccoli, cabbage, brussels sprouts, onions, artichokes, and asparagus  o fruits, such as pears, apples, and peaches  o whole grains, such as whole wheat and bran  o soft drinks and fruit drinks  o milk and milk products, such as cheese and ice cream, and packaged foods prepared with lactose, such as bread, cereal, and salad dressing  o foods containing sorbitol, such as dietetic foods and sugar free candies and gums  . The most common symptoms of gas are belching, flatulence, bloating, and abdominal pain. However, some of these symptoms are often caused by an intestinal disorder, such as irritable bowel syndrome, rather than too much gas. . The most common ways to reduce the discomfort of gas are changing diet, taking nonprescription medicines, and reducing the amount of air swallowed. . Digestive enzymes, such as lactase supplements, actually help digest carbohydrates and may allow people to eat foods that normally cause gas.

## 2018-01-04 NOTE — Progress Notes (Signed)
ON RECALL  °

## 2018-01-31 DIAGNOSIS — M159 Polyosteoarthritis, unspecified: Secondary | ICD-10-CM | POA: Diagnosis not present

## 2018-01-31 DIAGNOSIS — E559 Vitamin D deficiency, unspecified: Secondary | ICD-10-CM | POA: Diagnosis not present

## 2018-01-31 DIAGNOSIS — E785 Hyperlipidemia, unspecified: Secondary | ICD-10-CM | POA: Diagnosis not present

## 2018-01-31 DIAGNOSIS — I10 Essential (primary) hypertension: Secondary | ICD-10-CM | POA: Diagnosis not present

## 2018-01-31 DIAGNOSIS — Z23 Encounter for immunization: Secondary | ICD-10-CM | POA: Diagnosis not present

## 2018-02-02 ENCOUNTER — Ambulatory Visit (INDEPENDENT_AMBULATORY_CARE_PROVIDER_SITE_OTHER): Payer: PPO | Admitting: Otolaryngology

## 2018-02-02 DIAGNOSIS — H903 Sensorineural hearing loss, bilateral: Secondary | ICD-10-CM | POA: Diagnosis not present

## 2018-02-02 DIAGNOSIS — H9311 Tinnitus, right ear: Secondary | ICD-10-CM | POA: Diagnosis not present

## 2018-02-02 DIAGNOSIS — H6121 Impacted cerumen, right ear: Secondary | ICD-10-CM

## 2018-05-10 DIAGNOSIS — I10 Essential (primary) hypertension: Secondary | ICD-10-CM | POA: Diagnosis not present

## 2018-05-10 DIAGNOSIS — E785 Hyperlipidemia, unspecified: Secondary | ICD-10-CM | POA: Diagnosis not present

## 2018-05-10 DIAGNOSIS — N183 Chronic kidney disease, stage 3 (moderate): Secondary | ICD-10-CM | POA: Diagnosis not present

## 2018-05-10 DIAGNOSIS — G629 Polyneuropathy, unspecified: Secondary | ICD-10-CM | POA: Diagnosis not present

## 2018-05-10 DIAGNOSIS — R2243 Localized swelling, mass and lump, lower limb, bilateral: Secondary | ICD-10-CM | POA: Diagnosis not present

## 2018-05-24 ENCOUNTER — Encounter: Payer: Self-pay | Admitting: Orthopedic Surgery

## 2018-05-24 ENCOUNTER — Other Ambulatory Visit (HOSPITAL_COMMUNITY): Payer: Self-pay | Admitting: Internal Medicine

## 2018-05-24 ENCOUNTER — Ambulatory Visit: Payer: PPO

## 2018-05-24 ENCOUNTER — Ambulatory Visit: Payer: PPO | Admitting: Orthopedic Surgery

## 2018-05-24 ENCOUNTER — Ambulatory Visit (INDEPENDENT_AMBULATORY_CARE_PROVIDER_SITE_OTHER): Payer: PPO

## 2018-05-24 VITALS — BP 143/85 | Ht 69.0 in | Wt 204.0 lb

## 2018-05-24 DIAGNOSIS — M25561 Pain in right knee: Secondary | ICD-10-CM | POA: Diagnosis not present

## 2018-05-24 DIAGNOSIS — G8929 Other chronic pain: Secondary | ICD-10-CM

## 2018-05-24 DIAGNOSIS — Z96652 Presence of left artificial knee joint: Secondary | ICD-10-CM

## 2018-05-24 DIAGNOSIS — R609 Edema, unspecified: Secondary | ICD-10-CM

## 2018-05-24 NOTE — Progress Notes (Signed)
ANNUAL FOLLOW UP FOR  left TKA   Chief Complaint  Patient presents with  . Knee Pain    s/p left knee replacement c/o pain   . Back Pain  . Foot Pain    bilateral      HPI: The patient is here for the annual  follow-up x-ray for knee replacement. The patient is not complaining of pain weakness instability or stiffness in the repaired knee.   Complaint pain periarticular right knee joint stiffness occasional swelling decreased range of motion, pain for several years  Review of Systems  Constitutional: Negative for chills and fever.  Gastrointestinal: Negative.   Genitourinary: Negative.   Musculoskeletal: Positive for back pain.  Neurological: Negative for tingling.    Past Medical History:  Diagnosis Date  . Fluttering muscles SEP 2009   MBS/BASW NL  . GERD (gastroesophageal reflux disease)   . Hemorrhoids, internal OCT 2011  . Hereditary and idiopathic peripheral neuropathy 10/04/2016  . Hyperlipidemia   . Hypertension   . IBS (irritable bowel syndrome)      Examination of the left KNEE  BP (!) 143/85   Ht 5\' 9"  (1.753 m)   Wt 204 lb (92.5 kg)   BMI 30.13 kg/m   BP (!) 143/85   Ht 5\' 9"  (1.753 m)   Wt 204 lb (92.5 kg)   BMI 30.13 kg/m   General the patient is normally groomed in no distress  Mood normal Affect pleasant   The patient is Awake and alert ; oriented normal   Inspection shows : incision healed nicely without erythema, no tenderness no swelling  Range of motion total range of motion is 120 degrees  Stability the knee is stable anterior to posterior as well as medial to lateral  Strength quadriceps strength is normal  Skin no erythema around the skin incision  Cardiovascular NO EDEMA   Neuro: normal sensation in the operative leg   Gait: Requires a cane for support in the left hand  Right knee Mild valgus alignment mild tenderness lateral compartment no instability muscle tone normal strength normal no tremor neurovascular exam  intact skin normal    Medical decision-making section  X-rays ordered left knee with the following personal interpretation  Normal alignment without loosening   X-ray right knee see report Mild valgus alignment joint space narrowing lateral compartment mild secondary bone changes   Diagnosis  Encounter Diagnoses  Name Primary?  . S/P total knee replacement, left 05/21/15 Yes  . Chronic pain of right knee      Plan follow-up 1 year repeat x-rays left knee   Inject right knee  Procedure note right knee injection verbal consent was obtained to inject right knee joint  Timeout was completed to confirm the site of injection  The medications used were 40 mg of Depo-Medrol and 1% lidocaine 3 cc  Anesthesia was provided by ethyl chloride and the skin was prepped with alcohol.  After cleaning the skin with alcohol a 20-gauge needle was used to inject the right knee joint. There were no complications. A sterile bandage was applied.

## 2018-05-29 ENCOUNTER — Ambulatory Visit (HOSPITAL_COMMUNITY)
Admission: RE | Admit: 2018-05-29 | Discharge: 2018-05-29 | Disposition: A | Discharge status: Home / Self Care | Payer: PPO | Source: Ambulatory Visit | Attending: Internal Medicine | Admitting: Internal Medicine

## 2018-05-29 DIAGNOSIS — R609 Edema, unspecified: Secondary | ICD-10-CM | POA: Diagnosis not present

## 2018-05-29 DIAGNOSIS — I119 Hypertensive heart disease without heart failure: Secondary | ICD-10-CM | POA: Insufficient documentation

## 2018-05-29 DIAGNOSIS — K219 Gastro-esophageal reflux disease without esophagitis: Secondary | ICD-10-CM | POA: Diagnosis not present

## 2018-05-29 DIAGNOSIS — E785 Hyperlipidemia, unspecified: Secondary | ICD-10-CM | POA: Diagnosis not present

## 2018-05-29 DIAGNOSIS — I080 Rheumatic disorders of both mitral and aortic valves: Secondary | ICD-10-CM | POA: Diagnosis not present

## 2018-05-29 DIAGNOSIS — M7989 Other specified soft tissue disorders: Secondary | ICD-10-CM | POA: Diagnosis not present

## 2018-05-29 NOTE — Progress Notes (Signed)
*  PRELIMINARY RESULTS* Echocardiogram 2D Echocardiogram has been performed.  Katherine Joseph 05/29/2018, 3:37 PM

## 2018-08-08 ENCOUNTER — Other Ambulatory Visit (HOSPITAL_COMMUNITY): Payer: Self-pay | Admitting: Internal Medicine

## 2018-08-08 DIAGNOSIS — Z1231 Encounter for screening mammogram for malignant neoplasm of breast: Secondary | ICD-10-CM

## 2018-08-14 DIAGNOSIS — N83202 Unspecified ovarian cyst, left side: Secondary | ICD-10-CM | POA: Diagnosis not present

## 2018-08-14 DIAGNOSIS — I10 Essential (primary) hypertension: Secondary | ICD-10-CM | POA: Diagnosis not present

## 2018-08-14 DIAGNOSIS — N83209 Unspecified ovarian cyst, unspecified side: Secondary | ICD-10-CM | POA: Diagnosis not present

## 2018-08-14 DIAGNOSIS — E559 Vitamin D deficiency, unspecified: Secondary | ICD-10-CM | POA: Diagnosis not present

## 2018-08-14 DIAGNOSIS — E785 Hyperlipidemia, unspecified: Secondary | ICD-10-CM | POA: Diagnosis not present

## 2018-08-14 DIAGNOSIS — R7302 Impaired glucose tolerance (oral): Secondary | ICD-10-CM | POA: Diagnosis not present

## 2018-08-16 ENCOUNTER — Other Ambulatory Visit (HOSPITAL_COMMUNITY): Payer: Self-pay | Admitting: Internal Medicine

## 2018-08-16 DIAGNOSIS — N83209 Unspecified ovarian cyst, unspecified side: Secondary | ICD-10-CM

## 2018-08-22 ENCOUNTER — Ambulatory Visit (HOSPITAL_COMMUNITY): Payer: PPO

## 2018-08-24 ENCOUNTER — Ambulatory Visit (HOSPITAL_COMMUNITY)
Admission: RE | Admit: 2018-08-24 | Discharge: 2018-08-24 | Disposition: A | Discharge status: Home / Self Care | Payer: PPO | Source: Ambulatory Visit | Attending: Internal Medicine | Admitting: Internal Medicine

## 2018-08-24 DIAGNOSIS — N83201 Unspecified ovarian cyst, right side: Secondary | ICD-10-CM | POA: Insufficient documentation

## 2018-08-24 DIAGNOSIS — N83209 Unspecified ovarian cyst, unspecified side: Secondary | ICD-10-CM

## 2018-09-06 DIAGNOSIS — J209 Acute bronchitis, unspecified: Secondary | ICD-10-CM | POA: Diagnosis not present

## 2018-09-09 DIAGNOSIS — M1711 Unilateral primary osteoarthritis, right knee: Secondary | ICD-10-CM | POA: Diagnosis not present

## 2018-09-09 DIAGNOSIS — M1712 Unilateral primary osteoarthritis, left knee: Secondary | ICD-10-CM | POA: Diagnosis not present

## 2018-09-11 ENCOUNTER — Ambulatory Visit (HOSPITAL_COMMUNITY)
Admission: RE | Admit: 2018-09-11 | Discharge: 2018-09-11 | Disposition: A | Discharge status: Home / Self Care | Payer: PPO | Source: Ambulatory Visit | Attending: Internal Medicine | Admitting: Internal Medicine

## 2018-09-11 DIAGNOSIS — Z1231 Encounter for screening mammogram for malignant neoplasm of breast: Secondary | ICD-10-CM | POA: Diagnosis not present

## 2018-11-16 DIAGNOSIS — E785 Hyperlipidemia, unspecified: Secondary | ICD-10-CM | POA: Diagnosis not present

## 2018-11-16 DIAGNOSIS — N183 Chronic kidney disease, stage 3 (moderate): Secondary | ICD-10-CM | POA: Diagnosis not present

## 2018-11-16 DIAGNOSIS — G629 Polyneuropathy, unspecified: Secondary | ICD-10-CM | POA: Diagnosis not present

## 2018-11-16 DIAGNOSIS — I10 Essential (primary) hypertension: Secondary | ICD-10-CM | POA: Diagnosis not present

## 2018-11-16 DIAGNOSIS — R7302 Impaired glucose tolerance (oral): Secondary | ICD-10-CM | POA: Diagnosis not present

## 2018-11-16 DIAGNOSIS — E559 Vitamin D deficiency, unspecified: Secondary | ICD-10-CM | POA: Diagnosis not present

## 2018-11-29 ENCOUNTER — Encounter: Payer: Self-pay | Admitting: Gastroenterology

## 2019-02-14 ENCOUNTER — Ambulatory Visit (INDEPENDENT_AMBULATORY_CARE_PROVIDER_SITE_OTHER): Payer: PPO | Admitting: Internal Medicine

## 2019-02-14 DIAGNOSIS — E785 Hyperlipidemia, unspecified: Secondary | ICD-10-CM | POA: Diagnosis not present

## 2019-02-14 DIAGNOSIS — N183 Chronic kidney disease, stage 3 (moderate): Secondary | ICD-10-CM | POA: Diagnosis not present

## 2019-02-14 DIAGNOSIS — R7302 Impaired glucose tolerance (oral): Secondary | ICD-10-CM | POA: Diagnosis not present

## 2019-02-14 DIAGNOSIS — I10 Essential (primary) hypertension: Secondary | ICD-10-CM | POA: Diagnosis not present

## 2019-02-14 DIAGNOSIS — E559 Vitamin D deficiency, unspecified: Secondary | ICD-10-CM | POA: Diagnosis not present

## 2019-03-05 ENCOUNTER — Ambulatory Visit: Payer: PPO | Admitting: Orthopedic Surgery

## 2019-04-23 ENCOUNTER — Ambulatory Visit (INDEPENDENT_AMBULATORY_CARE_PROVIDER_SITE_OTHER): Payer: PPO | Admitting: Otolaryngology

## 2019-05-21 DIAGNOSIS — I10 Essential (primary) hypertension: Secondary | ICD-10-CM | POA: Diagnosis not present

## 2019-05-21 DIAGNOSIS — R7302 Impaired glucose tolerance (oral): Secondary | ICD-10-CM | POA: Diagnosis not present

## 2019-05-21 DIAGNOSIS — E785 Hyperlipidemia, unspecified: Secondary | ICD-10-CM | POA: Diagnosis not present

## 2019-05-30 ENCOUNTER — Other Ambulatory Visit: Payer: Self-pay

## 2019-05-30 ENCOUNTER — Ambulatory Visit: Payer: PPO

## 2019-05-30 ENCOUNTER — Ambulatory Visit (INDEPENDENT_AMBULATORY_CARE_PROVIDER_SITE_OTHER): Payer: PPO | Admitting: Orthopedic Surgery

## 2019-05-30 ENCOUNTER — Encounter: Payer: Self-pay | Admitting: Orthopedic Surgery

## 2019-05-30 VITALS — BP 132/63 | HR 63 | Temp 96.2°F | Ht 69.0 in | Wt 199.2 lb

## 2019-05-30 DIAGNOSIS — Z96652 Presence of left artificial knee joint: Secondary | ICD-10-CM

## 2019-05-30 DIAGNOSIS — M1710 Unilateral primary osteoarthritis, unspecified knee: Secondary | ICD-10-CM

## 2019-05-30 NOTE — Progress Notes (Signed)
ANNUAL FOLLOW UP FOR left TKA   Chief Complaint  Patient presents with  . Follow-up    Recheck on bilateral knees, DOS left 05-21-15.     HPI: The patient is here for the annual  follow-up x-ray for knee replacement. The patient is not complaining of pain weakness instability or stiffness in the repaired knee.   ROS  Right knee pain using hemp cream doing well   Examination of the left KNEE  BP 132/63   Pulse 63   Temp (!) 96.2 F (35.7 C)   Ht 5\' 9"  (1.753 m)   Wt 199 lb 4 oz (90.4 kg)   BMI 29.42 kg/m   General the patient is normally groomed in no distress  Inspection shows : incision healed nicely without erythema, no tenderness no swelling  Range of motion total range of motion is 120 degrees  Stability the knee is stable anterior to posterior as well as medial to lateral  Strength quadriceps strength is normal  Skin no erythema around the skin incision  Neuro: normal sensation in the operative leg   Gait: normal expected gait without cane    Medical decision-making section  X-rays ordered, internal imaging shows (see full dictated report) stable implant with no signs of loosening  Diagnosis  Encounter Diagnoses  Name Primary?  . S/P total knee replacement, left 05/21/15 Yes  . Arthritis of knee      Plan follow-up 1 year repeat x-rays

## 2019-06-04 ENCOUNTER — Other Ambulatory Visit (HOSPITAL_COMMUNITY): Payer: Self-pay | Admitting: Internal Medicine

## 2019-06-05 ENCOUNTER — Other Ambulatory Visit (HOSPITAL_COMMUNITY): Payer: Self-pay | Admitting: Internal Medicine

## 2019-06-11 ENCOUNTER — Other Ambulatory Visit (HOSPITAL_COMMUNITY): Payer: Self-pay | Admitting: Internal Medicine

## 2019-06-21 ENCOUNTER — Other Ambulatory Visit (HOSPITAL_COMMUNITY): Payer: Self-pay | Admitting: Internal Medicine

## 2019-07-04 ENCOUNTER — Ambulatory Visit (INDEPENDENT_AMBULATORY_CARE_PROVIDER_SITE_OTHER): Payer: PPO

## 2019-07-04 ENCOUNTER — Other Ambulatory Visit: Payer: Self-pay

## 2019-07-04 DIAGNOSIS — Z23 Encounter for immunization: Secondary | ICD-10-CM

## 2019-07-13 ENCOUNTER — Other Ambulatory Visit (HOSPITAL_COMMUNITY): Payer: Self-pay | Admitting: Internal Medicine

## 2019-08-01 ENCOUNTER — Other Ambulatory Visit (HOSPITAL_COMMUNITY): Payer: Self-pay | Admitting: Internal Medicine

## 2019-08-01 ENCOUNTER — Ambulatory Visit (INDEPENDENT_AMBULATORY_CARE_PROVIDER_SITE_OTHER): Payer: PPO | Admitting: Nurse Practitioner

## 2019-08-01 ENCOUNTER — Other Ambulatory Visit (INDEPENDENT_AMBULATORY_CARE_PROVIDER_SITE_OTHER): Payer: Self-pay

## 2019-08-01 MED ORDER — POTASSIUM CHLORIDE CRYS ER 20 MEQ PO TBCR: 20.0000 meq | EXTENDED_RELEASE_TABLET | Freq: Every day | ORAL | 1 refills | Status: DC

## 2019-08-01 MED ORDER — POTASSIUM CHLORIDE CRYS ER 20 MEQ PO TBCR: 20.0000 meq | EXTENDED_RELEASE_TABLET | Freq: Every day | ORAL | 3 refills | Status: DC

## 2019-08-09 ENCOUNTER — Other Ambulatory Visit (HOSPITAL_COMMUNITY): Payer: Self-pay | Admitting: Internal Medicine

## 2019-08-22 ENCOUNTER — Other Ambulatory Visit (HOSPITAL_COMMUNITY): Payer: Self-pay | Admitting: Internal Medicine

## 2019-08-22 DIAGNOSIS — Z1231 Encounter for screening mammogram for malignant neoplasm of breast: Secondary | ICD-10-CM

## 2019-08-28 ENCOUNTER — Ambulatory Visit (INDEPENDENT_AMBULATORY_CARE_PROVIDER_SITE_OTHER): Payer: PPO | Admitting: Nurse Practitioner

## 2019-08-30 ENCOUNTER — Other Ambulatory Visit (HOSPITAL_COMMUNITY): Payer: Self-pay | Admitting: Internal Medicine

## 2019-08-31 ENCOUNTER — Telehealth (INDEPENDENT_AMBULATORY_CARE_PROVIDER_SITE_OTHER): Payer: PPO | Admitting: Nurse Practitioner

## 2019-08-31 ENCOUNTER — Ambulatory Visit (INDEPENDENT_AMBULATORY_CARE_PROVIDER_SITE_OTHER): Payer: PPO | Admitting: Nurse Practitioner

## 2019-08-31 ENCOUNTER — Other Ambulatory Visit: Payer: Self-pay

## 2019-08-31 ENCOUNTER — Encounter (INDEPENDENT_AMBULATORY_CARE_PROVIDER_SITE_OTHER): Payer: Self-pay | Admitting: Nurse Practitioner

## 2019-08-31 VITALS — BP 135/84 | HR 83

## 2019-08-31 DIAGNOSIS — Z13820 Encounter for screening for osteoporosis: Secondary | ICD-10-CM

## 2019-08-31 DIAGNOSIS — Z0000 Encounter for general adult medical examination without abnormal findings: Secondary | ICD-10-CM | POA: Diagnosis not present

## 2019-08-31 NOTE — Progress Notes (Signed)
Due to national recommendations of social distancing related to the [place] pandemic, an audio/visual tele-health visit was felt to be the most appropriate encounter type for this patient today. I connected with  Katherine Joseph on 08/31/19 utilizing audio-only technology and verified that I am speaking with the correct person using two identifiers. The patient was located at their home, and I was located at my home office during the encounter.  The patient did not have access to technology to do this visit via video thus audio was used. I discussed the limitations of evaluation and management by telemedicine. The patient expressed understanding and agreed to proceed.      Subjective:   Katherine Joseph is a 84 y.o. female who presents for Medicare Annual (Subsequent) preventive examination.       Objective:     Vitals: BP (!) 153/76   Pulse 83   There is no height or weight on file to calculate BMI.  Advanced Directives 08/31/2019 02/17/2017 02/08/2017 02/03/2017 01/27/2017 01/19/2017 05/14/2016  Does Patient Have a Medical Advance Directive? Yes No No No No No No  Type of Advance Directive Out of facility DNR (pink MOST or yellow form) - - - - - -  Does patient want to make changes to medical advance directive? Yes (MAU/Ambulatory/Procedural Areas - Information given) - - - - - -  Would patient like information on creating a medical advance directive? - No - Patient declined No - Patient declined No - Patient declined No - Patient declined No - Patient declined No - patient declined information    Tobacco Social History   Tobacco Use  Smoking Status Never Smoker  Smokeless Tobacco Current User  . Types: Snuff     Ready to quit: Not Answered Counseling given: Not Answered   Clinical Intake:  Pre-visit preparation completed: Yes  Pain : No/denies pain Pain Score: 0-No pain(has pain intermittently in legs)     Nutritional Status: BMI 25 -29 Overweight Nutritional Risks:  None Diabetes: No  How often do you need to have someone help you when you read instructions, pamphlets, or other written materials from your doctor or pharmacy?: 1 - Never  Interpreter Needed?: No  Information entered by :: Jeralyn Ruths, NP-C  Past Medical History:  Diagnosis Date  . Fluttering muscles SEP 2009   MBS/BASW NL  . GERD (gastroesophageal reflux disease)   . Hemorrhoids, internal OCT 2011  . Hereditary and idiopathic peripheral neuropathy 10/04/2016  . Hyperlipidemia   . Hypertension   . IBS (irritable bowel syndrome)   . Prediabetes    Past Surgical History:  Procedure Laterality Date  . COLONOSCOPY  2004   RMR/NUR INFLAMMATORY POLYP, POST POLYPECTOMY BLEED  . COLONOSCOPY  oct 2011 [facility] nvd-WEIGHT LOSS   NL TI, [place]/DC TICS  . ESOPHAGOGASTRODUODENOSCOPY  2006   W/DILATION W/RMR  . KNEE ARTHROSCOPY    . RIGHT FOOT SURGERY    . TOTAL KNEE ARTHROPLASTY Left 05/21/2015   Procedure: TOTAL KNEE ARTHROPLASTY;  Surgeon: Carole Civil, MD;  Location: [facility];  Service: Orthopedics;  Laterality: Left;   Family History  Problem Relation Age of Onset  . Alzheimer's disease Mother   . Dementia Mother   . Cancer Father        stomach  . Alcohol abuse Brother   . Hypertension Other   . Cancer Other   . Colon polyps Neg Hx   . Colon cancer Neg Hx    Social History  Socioeconomic History  . Marital status: Widowed    Spouse name: Not on file  . Number of children: 9  . Years of education: 5  . Highest education level: Not on file  Occupational History  . Occupation: Retired  Tobacco Use  . Smoking status: Never Smoker  . Smokeless tobacco: Current User    Types: Snuff  Substance and Sexual Activity  . Alcohol use: No  . Drug use: No  . Sexual activity: Never  Other Topics Concern  . Not on file  Social History Narrative   Lives at home alone   Right-handed   Caffeine: no coffee, occasional clear soda   Social Determinants of Health   Financial  Resource Strain:   . Difficulty of Paying Living Expenses: Not on file  Food Insecurity:   . Worried About Charity fundraiser in the Last Year: Not on file  . Ran Out of Food in the Last Year: Not on file  Transportation Needs:   . Lack of Transportation (Medical): Not on file  . Lack of Transportation (Non-Medical): Not on file  Physical Activity:   . Days of Exercise per Week: Not on file  . Minutes of Exercise per Session: Not on file  Stress:   . Feeling of Stress : Not on file  Social Connections:   . Frequency of Communication with Friends and Family: Not on file  . Frequency of Social Gatherings with Friends and Family: Not on file  . Attends Religious Services: Not on file  . Active Member of Clubs or Organizations: Not on file  . Attends Archivist Meetings: Not on file  . Marital Status: Not on file    Outpatient Encounter Medications as of 08/31/2019  Medication Sig  . amLODipine (NORVASC) 10 MG tablet TAKE ONE TABLET BY MOUTH DAILY.  Marland Kitchen aspirin 81 MG tablet Take 81 mg by mouth every morning.   Marland Kitchen atorvastatin (LIPITOR) 20 MG tablet TAKE ONE TABLET BY MOUTH DAILY.  . divalproex (DEPAKOTE ER) 250 MG 24 hr tablet TAKE THREE (3) TABLETS BY MOUTH EVERY NIGHT.  . DULoxetine (CYMBALTA) 60 MG capsule TAKE ONE CAPSULE BY MOUTH EVERY DAY  . gabapentin (NEURONTIN) 300 MG capsule TAKE 1 OR 2 CAPSULES BY MOUTH UP TO THREE TIMES DAILY FOR BURNING PAIN IN FEET.  . metoprolol tartrate (LOPRESSOR) 25 MG tablet TAKE ONE TABLET BY MOUTH TWICE DAILY  . omeprazole (PRILOSEC) 20 MG capsule TAKE ONE CAPSULE BY MOUTH DAILY.  Marland Kitchen potassium chloride SA (KLOR-CON) 20 MEQ tablet Take 1 tablet (20 mEq total) by mouth daily.  Marland Kitchen torsemide (DEMADEX) 20 MG tablet TAKE ONE TABLET BY MOUTH AS NEEDED.  . [DISCONTINUED] atorvastatin (LIPITOR) 20 MG tablet Take 20 mg by mouth every evening.   . [DISCONTINUED] divalproex (DEPAKOTE) 250 MG DR tablet   . [DISCONTINUED] DULoxetine (CYMBALTA) 30 MG  capsule Take 60 mg by mouth daily.  . [DISCONTINUED] lisinopril-hydrochlorothiazide (PRINZIDE,ZESTORETIC) 20-25 MG tablet   . [DISCONTINUED] LORazepam (ATIVAN) 1 MG tablet Take 1 mg by mouth every 6 (six) hours as needed for anxiety.  . [DISCONTINUED] polyethylene glycol (MIRALAX / GLYCOLAX) packet Take 17 g by mouth daily as needed.  . [DISCONTINUED] potassium chloride SA (KLOR-CON M20) 20 MEQ tablet Take 1 tablet (20 mEq total) by mouth daily.   No facility-administered encounter medications on file as of 08/31/2019.    Activities of Daily Living In your present state of health, do you have any difficulty performing the following activities: 08/31/2019  Hearing? N  Difficulty concentrating or making decisions? N  Walking or climbing stairs? N  Dressing or bathing? N  Doing errands, shopping? N  Preparing Food and eating ? N  Using the Toilet? N  In the past six months, have you accidently leaked urine? N  Do you have problems with loss of bowel control? N  Managing your Medications? N  Managing your Finances? N  Housekeeping or managing your Housekeeping? N  Some recent data might be hidden    Patient Care Team: Doree Albee, MD as PCP - General (Internal Medicine) Danie Binder, MD (Gastroenterology)    Assessment:   This is a routine wellness examination for [place].  Exercise Activities and Dietary recommendations    Goals    . DIET - DECREASE SODA OR JUICE INTAKE    . DIET - EAT MORE FRUITS AND VEGETABLES       Fall Risk Fall Risk  08/31/2019  Falls in the past year? 0   Is the patient's home free of loose throw rugs in walkways, pet beds, electrical cords, etc?   yes      Grab bars in the bathroom? no      Handrails on the stairs?   yes      Adequate lighting?   yes  Timed Get Up and Go performed: Not completed as office visit was conducted remotely  Depression Screen PHQ 2/9 Scores 08/31/2019  PHQ - 2 Score 0     Cognitive Function     6CIT  Screen 08/31/2019  What Year? 0 points  What month? 0 points  What time? 0 points  Count back from 20 0 points  Months in reverse 2 points  Repeat phrase 4 points  Total Score 6    Immunization History  Administered Date(s) Administered  . Fluad Quad(high Dose 65+) 07/04/2019  . Pneumococcal Conjugate-13 01/31/2018    Qualifies for Shingles Vaccine?  Yes  Screening Tests Health Maintenance  Topic Date Due  . TETANUS/TDAP  06/22/1954  . PNA vac Low Risk Adult (2 of 2 - PPSV23) 02/01/2019  . INFLUENZA VACCINE  Completed  . DEXA SCAN  Completed    Cancer Screenings: Lung: Low Dose CT Chest recommended if Age 79-80 years, 30 pack-year currently smoking OR have quit w/in 15years. Patient does not qualify. Up to date of Bone Density/Dexa? No      Plan:   Patient would be willing to receive PPSV23 vaccine, thus we will administer this at her next office visit.  Patient declined to have sexual transmitted infection screening, she does not require tobacco cessation conversation, depression screening was negative today, overall fall risk is fairly low but we did discuss ways to prevent falls and to follow-up in the emergency department if she were to fall and hit her head.  She is due for osteoporosis screening I will order bone density scan and.  We did discuss advance care planning today.  She would like to make changes to her MOST form.  I have made a note to have this completed at her next office visit which will need to be done face-to-face.  I have personally reviewed and noted the following in the patient's chart:   . Medical and social history . Use of alcohol, tobacco or illicit drugs  . Current medications and supplements . Functional ability and status . Nutritional status . Physical activity . Advanced directives . List of other physicians . Hospitalizations, surgeries, and ER visits in previous  12 months . Vitals . Screenings to include cognitive, depression,  and falls . Referrals and appointments  In addition, I have reviewed and discussed with patient certain preventive protocols, quality metrics, and best practice recommendations. A written personalized care plan for preventive services as well as general preventive health recommendations were provided to patient.  This telephone encounter lasted for 33 minutes.     Ailene Ards, NP  08/31/2019

## 2019-08-31 NOTE — Patient Instructions (Signed)
Thank you for choosing [facility] as your medical provider! If you have any questions or concerns regarding your health care, please do not hesitate to call our office.  We will administer your pneumonia vaccine at your next office visit.  We will not screen for sexually transmitted infections per your preference, remember to follow-up in the emergency department if you were to fall and hit your head.  I will send order for bone density scan, if you do not hear about scheduling this within the next 2 weeks please notify our office.  We will discussed your advance care planning (MOST form) at your next office visit.  Please follow-up as scheduled in 3 weeks. We look forward to seeing you again soon! Have a great holiday season!!  At [facility] we value your feedback. You may receive a survey about your visit today. Please share your experience as we strive to create trusting relationships with our patients to provide genuine, compassionate, quality care.  We appreciate your understanding and patience as we review any laboratory studies, imaging, and other diagnostic tests that are ordered as we care for you. We do our best to address any and all results in a timely manner. If you do not hear about test results within 1 week, please do not hesitate to contact us. If we referred you to a specialist during your visit or ordered imaging testing, contact the office if you have not been contacted to be scheduled within 1 weeks.  We also encourage the use of MyChart, which contains your medical information for your review as well. If you are not enrolled in this feature, an access code is on this after visit summary for your convenience. Thank you for allowing [place] to be involved in your care.

## 2019-09-03 ENCOUNTER — Telehealth (INDEPENDENT_AMBULATORY_CARE_PROVIDER_SITE_OTHER): Payer: Self-pay

## 2019-09-04 ENCOUNTER — Other Ambulatory Visit (HOSPITAL_COMMUNITY): Payer: Self-pay | Admitting: Internal Medicine

## 2019-09-04 NOTE — Telephone Encounter (Signed)
Appt is set. Mailing out paper work and instructions to via [employer]

## 2019-09-12 ENCOUNTER — Other Ambulatory Visit (HOSPITAL_COMMUNITY): Payer: Self-pay | Admitting: Internal Medicine

## 2019-09-12 ENCOUNTER — Other Ambulatory Visit (HOSPITAL_COMMUNITY): Payer: Self-pay | Admitting: Nurse Practitioner

## 2019-09-17 ENCOUNTER — Ambulatory Visit (HOSPITAL_COMMUNITY)
Admission: RE | Admit: 2019-09-17 | Discharge: 2019-09-17 | Disposition: A | Discharge status: Home / Self Care | Payer: PPO | Source: Ambulatory Visit | Attending: Internal Medicine | Admitting: Internal Medicine

## 2019-09-17 ENCOUNTER — Ambulatory Visit (HOSPITAL_COMMUNITY)
Admission: RE | Admit: 2019-09-17 | Discharge: 2019-09-17 | Disposition: A | Discharge status: Home / Self Care | Payer: PPO | Source: Ambulatory Visit | Attending: Nurse Practitioner | Admitting: Nurse Practitioner

## 2019-09-17 ENCOUNTER — Other Ambulatory Visit: Payer: Self-pay

## 2019-09-17 DIAGNOSIS — Z13820 Encounter for screening for osteoporosis: Secondary | ICD-10-CM | POA: Insufficient documentation

## 2019-09-17 DIAGNOSIS — Z780 Asymptomatic menopausal state: Secondary | ICD-10-CM | POA: Diagnosis not present

## 2019-09-17 DIAGNOSIS — M85851 Other specified disorders of bone density and structure, right thigh: Secondary | ICD-10-CM | POA: Diagnosis not present

## 2019-09-17 DIAGNOSIS — Z1231 Encounter for screening mammogram for malignant neoplasm of breast: Secondary | ICD-10-CM | POA: Diagnosis not present

## 2019-09-25 ENCOUNTER — Other Ambulatory Visit: Payer: Self-pay

## 2019-09-25 ENCOUNTER — Encounter (INDEPENDENT_AMBULATORY_CARE_PROVIDER_SITE_OTHER): Payer: Self-pay | Admitting: Internal Medicine

## 2019-09-25 ENCOUNTER — Ambulatory Visit (INDEPENDENT_AMBULATORY_CARE_PROVIDER_SITE_OTHER): Payer: PPO | Admitting: Internal Medicine

## 2019-09-25 VITALS — BP 140/70 | HR 64 | Ht 69.0 in | Wt 200.6 lb

## 2019-09-25 DIAGNOSIS — I10 Essential (primary) hypertension: Secondary | ICD-10-CM

## 2019-09-25 DIAGNOSIS — R7303 Prediabetes: Secondary | ICD-10-CM

## 2019-09-25 DIAGNOSIS — M85859 Other specified disorders of bone density and structure, unspecified thigh: Secondary | ICD-10-CM | POA: Diagnosis not present

## 2019-09-25 DIAGNOSIS — D509 Iron deficiency anemia, unspecified: Secondary | ICD-10-CM

## 2019-09-25 DIAGNOSIS — E782 Mixed hyperlipidemia: Secondary | ICD-10-CM

## 2019-09-25 DIAGNOSIS — G609 Hereditary and idiopathic neuropathy, unspecified: Secondary | ICD-10-CM

## 2019-09-25 NOTE — Progress Notes (Signed)
Metrics: Intervention Frequency ACO  Documented Smoking Status Yearly  Screened one or more times in 24 months  Cessation Counseling or  Active cessation medication Past 24 months  Past 24 months   Guideline developer: UpToDate (See UpToDate for funding source) Date Released: 2014       Wellness Office Visit  Subjective:  Patient ID: Katherine Joseph, female    DOB: 23-Oct-1934  Age: 85 y.o. MRN: [id number]  CC: This lady comes in for follow-up of hypertension, hyperlipidemia, peripheral neuropathy, prediabetes. HPI  She also describes symptom of what could well be restless legs.  She continues with antihypertensive therapy without any problems.  She denies any chest pain, dyspnea, palpitations or limb weakness. She continues with statin therapy for hyperlipidemia. She was prediabetic previously.  Her hemoglobin A1c was just below 6% I believe.  Past Medical History:  Diagnosis Date  . Fluttering muscles SEP 2009   MBS/BASW NL  . GERD (gastroesophageal reflux disease)   . Hemorrhoids, internal OCT 2011  . Hereditary and idiopathic peripheral neuropathy 10/04/2016  . Hyperlipidemia   . Hypertension   . IBS (irritable bowel syndrome)   . Prediabetes       Family History  Problem Relation Age of Onset  . Alzheimer's disease Mother   . Dementia Mother   . Cancer Father        stomach  . Alcohol abuse Brother   . Hypertension Other   . Cancer Other   . Colon polyps Neg Hx   . Colon cancer Neg Hx     Social History   Social History Narrative   Lives at home alone   Right-handed   Caffeine: no coffee, occasional clear soda   Social History   Tobacco Use  . Smoking status: Never Smoker  . Smokeless tobacco: Current User    Types: Snuff  Substance Use Topics  . Alcohol use: No    Current Meds  Medication Sig  . amLODipine (NORVASC) 10 MG tablet TAKE ONE TABLET BY MOUTH DAILY.  Marland Kitchen aspirin 81 MG tablet Take 81 mg by mouth every morning.   Marland Kitchen atorvastatin  (LIPITOR) 20 MG tablet TAKE ONE TABLET BY MOUTH DAILY.  . divalproex (DEPAKOTE ER) 250 MG 24 hr tablet TAKE THREE (3) TABLETS BY MOUTH EVERY NIGHT.  . DULoxetine (CYMBALTA) 60 MG capsule TAKE ONE CAPSULE BY MOUTH EVERY DAY  . gabapentin (NEURONTIN) 300 MG capsule TAKE 1 OR 2 CAPSULES BY MOUTH UP TO THREE TIMES DAILY FOR BURNING PAIN IN FEET.  . metoprolol tartrate (LOPRESSOR) 25 MG tablet TAKE ONE TABLET BY MOUTH TWICE DAILY  . omeprazole (PRILOSEC) 20 MG capsule TAKE ONE CAPSULE BY MOUTH DAILY.  Marland Kitchen potassium chloride SA (KLOR-CON) 20 MEQ tablet Take 1 tablet (20 mEq total) by mouth daily.  Marland Kitchen torsemide (DEMADEX) 20 MG tablet TAKE ONE TABLET BY MOUTH AS NEEDED.       Objective:   Today's Vitals: BP 140/70   Pulse 64   Ht _0  (1.753 m)   Wt 200 lb 9.6 oz (91 kg)   SpO2 98%   BMI 29.62 kg/m  Vitals with BMI 09/25/2019 08/31/2019 08/31/2019  Height _1  - -  Weight 200 lbs 10 oz - -  BMI 50.09 - -  Systolic 381 829 937  Diastolic 70 84 76  Pulse 64 - 83     Physical Exam  She looks systemically well.  She is overweight.  Blood pressure reasonable for age.  No new physical  findings today.     Assessment   1. Osteopenia of hip, unspecified laterality   2. Prediabetes   3. Essential hypertension   4. Microcytic anemia   5. Mixed hyperlipidemia   6. Hereditary and idiopathic peripheral neuropathy       Tests ordered Orders Placed This Encounter  Procedures  . CBC  . CMP with eGFR([facility])  . Hemoglobin A1c  . Lipid Panel     Plan: 1. Blood work is ordered as above. 2. I reviewed her bone density scan with her which shows osteopenia but not osteoporosis. 3. She will continue with antihypertensive therapy as before and I have reviewed this medication. 4. We will check for anemia. 5. She will continue with statin therapy and she is tolerating this without any problems. 6. She will continue with gabapentin for her neuropathy. 7. Today, I also discussed with her  advance care planning and we together filled out a MOST form.  Compared to 2018, she does not want to be resuscitated in the event of a cardiopulmonary arrest and overall would like more aggressive measures.  These have been documented and she has signed the form as well as I have.  I spent at least 15 minutes discussing advanced care planning. 8. Further recommendations will depend on results and I will see her in about 3 to 4 months time for an annual physical exam.   No orders of the defined types were placed in this encounter.   Doree Albee, MD

## 2019-09-27 DIAGNOSIS — E782 Mixed hyperlipidemia: Secondary | ICD-10-CM | POA: Diagnosis not present

## 2019-09-27 DIAGNOSIS — I10 Essential (primary) hypertension: Secondary | ICD-10-CM | POA: Diagnosis not present

## 2019-09-27 DIAGNOSIS — R7303 Prediabetes: Secondary | ICD-10-CM | POA: Diagnosis not present

## 2019-09-27 DIAGNOSIS — D509 Iron deficiency anemia, unspecified: Secondary | ICD-10-CM | POA: Diagnosis not present

## 2019-09-28 LAB — COMPLETE METABOLIC PANEL WITH GFR
AG Ratio: 1.3 (calc) (ref 1.0–2.5)
ALT: 11 U/L (ref 6–29)
AST: 17 U/L (ref 10–35)
Albumin: 3.8 g/dL (ref 3.6–5.1)
Alkaline phosphatase (APISO): 64 U/L (ref 37–153)
BUN/Creatinine Ratio: 17 (calc) (ref 6–22)
BUN: 18 mg/dL (ref 7–25)
CO2: 31 mmol/L (ref 20–32)
Calcium: 9.4 mg/dL (ref 8.6–10.4)
Chloride: 105 mmol/L (ref 98–110)
Creat: 1.04 mg/dL — ABNORMAL HIGH (ref 0.60–0.88)
GFR, Est African American: 58 mL/min/{1.73_m2} — ABNORMAL LOW (ref >=60)
GFR, Est Non African American: 50 mL/min/{1.73_m2} — ABNORMAL LOW (ref >=60)
Globulin: 3 g/dL (calc) (ref 1.9–3.7)
Glucose, Bld: 106 mg/dL — ABNORMAL HIGH (ref 65–99)
Potassium: 5.1 mmol/L (ref 3.5–5.3)
Sodium: 143 mmol/L (ref 135–146)
Total Bilirubin: 0.3 mg/dL (ref 0.2–1.2)
Total Protein: 6.8 g/dL (ref 6.1–8.1)

## 2019-09-28 LAB — CBC
HCT: 41 % (ref 35.0–45.0)
Hemoglobin: 12.7 g/dL (ref 11.7–15.5)
MCH: 23.6 pg — ABNORMAL LOW (ref 27.0–33.0)
MCHC: 31 g/dL — ABNORMAL LOW (ref 32.0–36.0)
MCV: 76.1 fL — ABNORMAL LOW (ref 80.0–100.0)
MPV: 12.2 fL (ref 7.5–12.5)
Platelets: 146 10*3/uL (ref 140–400)
RBC: 5.39 10*6/uL — ABNORMAL HIGH (ref 3.80–5.10)
RDW: 14.4 % (ref 11.0–15.0)
WBC: 8.4 10*3/uL (ref 3.8–10.8)

## 2019-09-28 LAB — HEMOGLOBIN A1C
Hgb A1c MFr Bld: 6 % of total Hgb — ABNORMAL HIGH (ref <5.7)
Mean Plasma Glucose: 126 (calc)
eAG (mmol/L): 7 (calc)

## 2019-09-28 LAB — LIPID PANEL
Cholesterol: 155 mg/dL (ref <200)
HDL: 34 mg/dL — ABNORMAL LOW (ref >=50)
LDL Cholesterol (Calc): 98 mg/dL (calc)
Non-HDL Cholesterol (Calc): 121 mg/dL (calc) (ref <130)
Total CHOL/HDL Ratio: 4.6 (calc) (ref <5.0)
Triglycerides: 131 mg/dL (ref <150)

## 2019-10-31 ENCOUNTER — Other Ambulatory Visit (INDEPENDENT_AMBULATORY_CARE_PROVIDER_SITE_OTHER): Payer: Self-pay | Admitting: Internal Medicine

## 2019-10-31 MED ORDER — DULOXETINE HCL 60 MG PO CPEP: 60.0000 mg | ORAL_CAPSULE | Freq: Every day | ORAL | 2 refills | Status: DC

## 2019-11-01 ENCOUNTER — Other Ambulatory Visit (HOSPITAL_COMMUNITY): Payer: Self-pay | Admitting: Internal Medicine

## 2019-11-14 DIAGNOSIS — Z23 Encounter for immunization: Secondary | ICD-10-CM | POA: Diagnosis not present

## 2019-11-21 ENCOUNTER — Ambulatory Visit: Payer: PPO | Admitting: Gastroenterology

## 2019-11-29 ENCOUNTER — Other Ambulatory Visit (INDEPENDENT_AMBULATORY_CARE_PROVIDER_SITE_OTHER): Payer: Self-pay | Admitting: Internal Medicine

## 2019-12-04 ENCOUNTER — Other Ambulatory Visit (INDEPENDENT_AMBULATORY_CARE_PROVIDER_SITE_OTHER): Payer: Self-pay

## 2019-12-04 MED ORDER — DIVALPROEX SODIUM ER 250 MG PO TB24: ORAL_TABLET | ORAL | 2 refills | Status: DC

## 2019-12-06 ENCOUNTER — Ambulatory Visit: Payer: PPO | Admitting: Gastroenterology

## 2019-12-06 ENCOUNTER — Other Ambulatory Visit: Payer: Self-pay

## 2019-12-06 ENCOUNTER — Encounter: Payer: Self-pay | Admitting: Gastroenterology

## 2019-12-06 DIAGNOSIS — K219 Gastro-esophageal reflux disease without esophagitis: Secondary | ICD-10-CM

## 2019-12-06 DIAGNOSIS — K581 Irritable bowel syndrome with constipation: Secondary | ICD-10-CM | POA: Diagnosis not present

## 2019-12-06 MED ORDER — METRONIDAZOLE 500 MG PO TABS: 500.0000 mg | ORAL_TABLET | Freq: Two times a day (BID) | ORAL | 0 refills | Status: DC

## 2019-12-06 MED ORDER — CIPROFLOXACIN HCL 500 MG PO TABS: ORAL_TABLET | ORAL | 0 refills | Status: DC

## 2019-12-06 NOTE — Assessment & Plan Note (Signed)
SYMPTOMS NOT IDEALLY CONTROLLED DUE TO BLOATING. MAY BE EXACERBATED BY SMALL INTESTINE BACTERIAL OVERGROWTH.  TO REDUCE BLOATING:   1. TRY NOT TO EAT ITEMS THAT CAUSE BLOATING & GAS.  HANDOUT GIVEN.   2. WHEN YOU CONSUME DAIRY, ADD LACTASE 2-3 PILLS WITH MEALS UP TO THREE TIMES A DAY.   3. TAKE CIPRO & FLAGYL TWICE DAILY FOR 5 DAYS. MEDICATION SIDE EFFECTS INCLUDE HEEL PAIN, NAUSEA, VOMITING. AVOID ALCOHOL AND COUGH SYRUP WITH ALCOHOL.   FOLLOW UP IN 1 YEAR WITH DR. Gala Romney.

## 2019-12-06 NOTE — Patient Instructions (Addendum)
TO REDUCE BLOATING:    1. TRY NOT TO EAT ITEMS THAT CAUSE BLOATING & GAS. SEE INFO BELOW.    2. WHEN YOU CONSUME DAIRY, ADD LACTASE 2-3 PILLS WITH MEALS UP TO THREE TIMES A DAY.    3. TAKE CIPRO & FLAGYL TWICE DAILY FOR 5 DAYS. MEDICATION SIDE EFFECTS INCLUDE HEEL PAIN, NAUSEA, VOMITING. AVOID ALCOHOL AND COUGH SYRUP WITH ALCOHOL.      CONTINUE OMEPRAZOLE.  TAKE 30 MINUTES PRIOR TO YOUR FIRST MEAL.   PLEASE CALL WITH QUESTIONS OR CONCERNS.  FOLLOW UP IN [place].   BLOATING AND GAS PREVENTION  Although gas may be uncomfortable and embarrassing, it is not life-threatening. Understanding causes, ways to reduce symptoms, and treatment will help most people find some relief.  Points to remember . Everyone has gas in the digestive tract. Marland Kitchen People often believe normal passage of gas to be excessive. . Gas comes from two main sources: swallowed air and normal breakdown of certain foods by harmless bacteria naturally present in the large intestine. . Many foods with carbohydrates can cause gas. Fats and proteins cause little gas. . Foods that may cause gas include o beans  o vegetables, such as broccoli, cabbage, brussels sprouts, onions, artichokes, and asparagus  o fruits, such as pears, apples, and peaches  o whole grains, such as whole wheat and bran  o soft drinks and fruit drinks  o milk and milk products, such as cheese and ice cream, and packaged foods prepared with lactose, such as bread, cereal, and salad dressing  o foods containing sorbitol, such as dietetic foods and sugar free candies and gums . The most common symptoms of gas are belching, flatulence, bloating, and abdominal pain. However, some of these symptoms are often caused by an intestinal disorder, such as irritable bowel syndrome, rather than too much gas. . The most common ways to reduce the discomfort of gas are changing diet, taking nonprescription medicines, and reducing the amount of air  swallowed. . Digestive enzymes, such as lactase supplements, actually help digest carbohydrates and may allow people to eat foods that normally cause gas.

## 2019-12-06 NOTE — Progress Notes (Signed)
Subjective:    Patient ID: Katherine Joseph, female    DOB: August 31, 1935, 85 y.o.   MRN: [id number]  Doree Albee, MD   HPI CONCERNED ABOUT BLOATING AND SWELLING IN ABDOMEN. THINKS IT'S HER BLADDER OR OVARY. MILK: IN CEREAL(CHEERIOS, CORN FLAKES)AND ALTERNATES WITH OATMEAL: ONCE A DAY, BANANA DAILY. CHEESE: NONE, ICE CREAM: HARDLY EVER. BMs: EVERY DAY, NL. ABDOMINAL PAIN: NO. NO BREAKFAST THIS AM. DINNER LAST NIGHT: PEAS/CORN, STEAK, CANNED BISCUIT. USES METAMUCIL MOST DAYS.  PT DENIES FEVER, CHILLS, HEMATOCHEZIA, HEMATEMESIS, nausea, vomiting, melena, diarrhea, CHEST PAIN, SHORTNESS OF BREATH, CHANGE IN BOWEL IN HABITS, constipation, abdominal pain, problems swallowing, problems with sedation, heartburn or indigestion.  Past Medical History:  Diagnosis Date  . Fluttering muscles SEP 2009   MBS/BASW NL  . GERD (gastroesophageal reflux disease)   . Hemorrhoids, internal OCT 2011  . Hereditary and idiopathic peripheral neuropathy 10/04/2016  . Hyperlipidemia   . Hypertension   . IBS (irritable bowel syndrome)   . Prediabetes    Past Surgical History:  Procedure Laterality Date  . COLONOSCOPY  2004   RMR/NUR INFLAMMATORY POLYP, POST POLYPECTOMY BLEED  . COLONOSCOPY  oct 2011 SLF nvd-WEIGHT LOSS   NL TI, [place]/DC TICS  . ESOPHAGOGASTRODUODENOSCOPY  2006   W/DILATION W/RMR  . KNEE ARTHROSCOPY    . RIGHT FOOT SURGERY    . TOTAL KNEE ARTHROPLASTY Left 05/21/2015   Procedure: TOTAL KNEE ARTHROPLASTY;  Surgeon: Carole Civil, MD;  Location: [facility];  Service: Orthopedics;  Laterality: Left;   Allergies  Allergen Reactions  . Sulfa Antibiotics Hives  . Iodinated Diagnostic Agents Rash   Current Outpatient Medications  Medication Sig    . amLODipine (NORVASC) 10 MG tablet TAKE ONE TABLET BY MOUTH DAILY.    Marland Kitchen aspirin 81 MG tablet Take 81 mg by mouth every morning.     Marland Kitchen atorvastatin (LIPITOR) 20 MG tablet TAKE ONE TABLET BY MOUTH DAILY.    . divalproex (DEPAKOTE ER) 250 MG 24  hr tablet TAKE THREE (3) TABLETS BY MOUTH EVERY NIGHT.    . DULoxetine (CYMBALTA) 60 MG capsule Take 1 capsule (60 mg total) by mouth daily.    Marland Kitchen gabapentin (NEURONTIN) 300 MG capsule TAKE 1 OR 2 CAPSULES BY MOUTH UP TO THREE TIMES DAILY FOR BURNING PAIN IN FEET.    . metoprolol tartrate (LOPRESSOR) 25 MG tablet TAKE ONE TABLET BY MOUTH TWICE DAILY    . omeprazole (PRILOSEC) 20 MG capsule TAKE ONE CAPSULE BY MOUTH DAILY.    Marland Kitchen potassium chloride SA (KLOR-CON) 20 MEQ tablet TAKE ONE TABLET BY MOUTH DAILY    . torsemide (DEMADEX) 20 MG tablet TAKE ONE TABLET BY MOUTH AS NEEDED.     No current facility-administered medications for this visit.      Review of Systems     Objective:   Physical Exam Constitutional:      General: She is not in acute distress.    Appearance: Normal appearance.  HENT:     Mouth/Throat:     Comments: MASK IN PLACE Eyes:     General: No scleral icterus.    Pupils: Pupils are equal, round, and reactive to light.  Cardiovascular:     Rate and Rhythm: Normal rate and regular rhythm.     Pulses: Normal pulses.     Heart sounds: Normal heart sounds.  Pulmonary:     Effort: Pulmonary effort is normal.     Breath sounds: Normal breath sounds.  Abdominal:  General: Bowel sounds are normal.     Palpations: Abdomen is soft.     Tenderness: There is no abdominal tenderness.  Musculoskeletal:     Cervical back: Normal range of motion.     Right lower leg: Edema present.     Left lower leg: Edema present.     Comments: TRACE BILATERAL LOWER EXTREMITIES  Lymphadenopathy:     Cervical: No cervical adenopathy.  Skin:    General: Skin is warm and dry.  Neurological:     Mental Status: She is alert and oriented to person, place, and time.     Comments: NO  NEW FOCAL DEFICITS  Psychiatric:        Mood and Affect: Mood normal.     Comments: NORMAL AFFECT           Assessment & Plan:

## 2019-12-06 NOTE — Assessment & Plan Note (Signed)
SYMPTOMS CONTROLLED/RESOLVED.  CONTINUE OMEPRAZOLE.  TAKE 30 MINUTES PRIOR TO YOUR FIRST MEAL. FOLLOW UP IN 1 YR WITH DR.ROURK.

## 2019-12-18 ENCOUNTER — Other Ambulatory Visit (HOSPITAL_COMMUNITY): Payer: Self-pay | Admitting: Internal Medicine

## 2020-01-14 DIAGNOSIS — H26491 Other secondary cataract, right eye: Secondary | ICD-10-CM | POA: Diagnosis not present

## 2020-01-14 DIAGNOSIS — H04123 Dry eye syndrome of bilateral lacrimal glands: Secondary | ICD-10-CM | POA: Diagnosis not present

## 2020-01-14 DIAGNOSIS — H5201 Hypermetropia, right eye: Secondary | ICD-10-CM | POA: Diagnosis not present

## 2020-01-14 DIAGNOSIS — H524 Presbyopia: Secondary | ICD-10-CM | POA: Diagnosis not present

## 2020-01-14 DIAGNOSIS — H35033 Hypertensive retinopathy, bilateral: Secondary | ICD-10-CM | POA: Diagnosis not present

## 2020-01-14 DIAGNOSIS — I10 Essential (primary) hypertension: Secondary | ICD-10-CM | POA: Diagnosis not present

## 2020-01-14 DIAGNOSIS — Z961 Presence of intraocular lens: Secondary | ICD-10-CM | POA: Diagnosis not present

## 2020-01-14 DIAGNOSIS — H353133 Nonexudative age-related macular degeneration, bilateral, advanced atrophic without subfoveal involvement: Secondary | ICD-10-CM | POA: Diagnosis not present

## 2020-01-14 DIAGNOSIS — H52223 Regular astigmatism, bilateral: Secondary | ICD-10-CM | POA: Diagnosis not present

## 2020-01-22 ENCOUNTER — Other Ambulatory Visit (HOSPITAL_COMMUNITY): Payer: Self-pay | Admitting: Internal Medicine

## 2020-01-24 ENCOUNTER — Encounter (INDEPENDENT_AMBULATORY_CARE_PROVIDER_SITE_OTHER): Payer: Self-pay | Admitting: Internal Medicine

## 2020-01-24 ENCOUNTER — Other Ambulatory Visit: Payer: Self-pay

## 2020-01-24 ENCOUNTER — Ambulatory Visit (INDEPENDENT_AMBULATORY_CARE_PROVIDER_SITE_OTHER): Payer: PPO | Admitting: Internal Medicine

## 2020-01-24 VITALS — BP 140/70 | HR 71 | Temp 97.4°F | Ht 69.0 in | Wt 202.8 lb

## 2020-01-24 DIAGNOSIS — R7303 Prediabetes: Secondary | ICD-10-CM

## 2020-01-24 DIAGNOSIS — I10 Essential (primary) hypertension: Secondary | ICD-10-CM

## 2020-01-24 DIAGNOSIS — E782 Mixed hyperlipidemia: Secondary | ICD-10-CM

## 2020-01-24 DIAGNOSIS — R197 Diarrhea, unspecified: Secondary | ICD-10-CM

## 2020-01-24 NOTE — Progress Notes (Signed)
Metrics: Intervention Frequency ACO  Documented Smoking Status Yearly  Screened one or more times in 24 months  Cessation Counseling or  Active cessation medication Past 24 months  Past 24 months   Guideline developer: UpToDate (See UpToDate for funding source) Date Released: 2014       Wellness Office Visit  Subjective:  Patient ID: Katherine Joseph, female    DOB: 1935/07/24  Age: 85 y.o. MRN: [id number]  CC: This lady was supposed to come in for an annual physical exam today but I was being delayed and she also had acute symptoms so we will postpone this for another time.  Her chief complaint today is diarrhea. HPI  She has had diarrhea for about the last 2 weeks.  There is no any blood associated with it and she does not have any significant abdominal pain in between the episodes of diarrhea.  She does have a history of sigmoid diverticulosis.  There is no nausea or vomiting. She also describes increased frequency of micturition of the last few weeks.  On the last time I saw her and we did blood work, she was prediabetic.  She denies any significant thirst. She continues to take statin therapy for hyperlipidemia. She continues on antihypertensive therapy for hypertension.  She denies any chest pain, dyspnea, palpitations or limb weakness. Past Medical History:  Diagnosis Date  . Fluttering muscles SEP 2009   MBS/BASW NL  . GERD (gastroesophageal reflux disease)   . Hemorrhoids, internal OCT 2011  . Hereditary and idiopathic peripheral neuropathy 10/04/2016  . Hyperlipidemia   . Hypertension   . IBS (irritable bowel syndrome)   . Prediabetes       Family History  Problem Relation Age of Onset  . Alzheimer's disease Mother   . Dementia Mother   . Cancer Father        stomach  . Alcohol abuse Brother   . Hypertension Other   . Cancer Other   . Colon polyps Neg Hx   . Colon cancer Neg Hx     Social History   Social History Narrative   Lives at home alone   Right-handed   Caffeine: no coffee, occasional clear soda   Social History   Tobacco Use  . Smoking status: Never Smoker  . Smokeless tobacco: Current User    Types: Snuff  Substance Use Topics  . Alcohol use: No    Current Meds  Medication Sig  . amLODipine (NORVASC) 10 MG tablet TAKE ONE TABLET BY MOUTH DAILY.  Marland Kitchen aspirin 81 MG tablet Take 81 mg by mouth every morning.   Marland Kitchen atorvastatin (LIPITOR) 20 MG tablet TAKE ONE TABLET BY MOUTH DAILY  . divalproex (DEPAKOTE ER) 250 MG 24 hr tablet TAKE THREE (3) TABLETS BY MOUTH EVERY NIGHT.  . DULoxetine (CYMBALTA) 60 MG capsule Take 1 capsule (60 mg total) by mouth daily.  Marland Kitchen gabapentin (NEURONTIN) 300 MG capsule TAKE 1 OR 2 CAPSULES BY MOUTH UP TO THREE TIMES DAILY FOR BURNING PAIN IN FEET.  . metoprolol tartrate (LOPRESSOR) 25 MG tablet TAKE ONE TABLET BY MOUTH TWICE DAILY  . omeprazole (PRILOSEC) 20 MG capsule TAKE ONE CAPSULE BY MOUTH DAILY.  Marland Kitchen potassium chloride SA (KLOR-CON) 20 MEQ tablet TAKE ONE TABLET BY MOUTH DAILY  . torsemide (DEMADEX) 20 MG tablet TAKE ONE TABLET BY MOUTH AS NEEDED.      Objective:   Today's Vitals: BP 140/70 (BP Location: Left Arm, Patient Position: Sitting, Cuff Size: Normal)   Pulse 71  Temp (!) 97.4 F (36.3 C) (Temporal)   Ht 5\' 9"  (1.753 m)   Wt 202 lb 12.8 oz (92 kg)   SpO2 98%   BMI 29.95 kg/m  Vitals with BMI 01/24/2020 12/06/2019 09/25/2019  Height 5\' 9"  5\' 9"  5\' 9"   Weight 202 lbs 13 oz 204 lbs 200 lbs 10 oz  BMI 29.93 Q000111Q [id number]  Systolic [id number] [id number]  Diastolic 70 78 70  Pulse 71 68 64     Physical Exam  She looks systemically well.  Blood pressure is well controlled for her age.  She is alert and orientated without any obvious focal neurological signs.     Assessment   1. Essential hypertension   2. Prediabetes   3. Mixed hyperlipidemia   4. Diarrhea, unspecified type       Tests ordered Orders Placed This Encounter  Procedures  . CBC  . COMPLETE METABOLIC PANEL WITH  GFR  . Hemoglobin A1c  . Lipid panel     Plan: 1. Blood work is ordered above. 2. She will continue with the same antihypertensive therapy as this seems to be controlling her blood pressure well. 3. She will continue with statin therapy for hyperlipidemia and we will check a lipid panel today. 4. As far as the diarrhea is concerned, this is likely viral/nonspecific in origin I would anticipate resolution.  She is taking antidiarrheal medications and I told her not to do this unless the diarrhea is very bad. 5. I will see her in about a month's time for close follow-up and if the diarrhea has not improved, she may need to go back to gastroenterology.   No orders of the defined types were placed in this encounter.   Doree Albee, MD

## 2020-01-25 LAB — COMPLETE METABOLIC PANEL WITH GFR
AG Ratio: 1.2 (calc) (ref 1.0–2.5)
ALT: 12 U/L (ref 6–29)
AST: 14 U/L (ref 10–35)
Albumin: 3.5 g/dL — ABNORMAL LOW (ref 3.6–5.1)
Alkaline phosphatase (APISO): 61 U/L (ref 37–153)
BUN/Creatinine Ratio: 14 (calc) (ref 6–22)
BUN: 15 mg/dL (ref 7–25)
CO2: 28 mmol/L (ref 20–32)
Calcium: 8.8 mg/dL (ref 8.6–10.4)
Chloride: 101 mmol/L (ref 98–110)
Creat: 1.05 mg/dL — ABNORMAL HIGH (ref 0.60–0.88)
GFR, Est African American: 57 mL/min/{1.73_m2} — ABNORMAL LOW (ref >=60)
GFR, Est Non African American: 49 mL/min/{1.73_m2} — ABNORMAL LOW (ref >=60)
Globulin: 2.9 g/dL (calc) (ref 1.9–3.7)
Glucose, Bld: 83 mg/dL (ref 65–139)
Potassium: 5.1 mmol/L (ref 3.5–5.3)
Sodium: 140 mmol/L (ref 135–146)
Total Bilirubin: 0.2 mg/dL (ref 0.2–1.2)
Total Protein: 6.4 g/dL (ref 6.1–8.1)

## 2020-01-25 LAB — CBC
HCT: 39.8 % (ref 35.0–45.0)
Hemoglobin: 12.2 g/dL (ref 11.7–15.5)
MCH: 23.7 pg — ABNORMAL LOW (ref 27.0–33.0)
MCHC: 30.7 g/dL — ABNORMAL LOW (ref 32.0–36.0)
MCV: 77.3 fL — ABNORMAL LOW (ref 80.0–100.0)
MPV: 12.2 fL (ref 7.5–12.5)
Platelets: 202 10*3/uL (ref 140–400)
RBC: 5.15 10*6/uL — ABNORMAL HIGH (ref 3.80–5.10)
RDW: 14.8 % (ref 11.0–15.0)
WBC: 8.5 10*3/uL (ref 3.8–10.8)

## 2020-01-25 LAB — LIPID PANEL
Cholesterol: 135 mg/dL (ref <200)
HDL: 30 mg/dL — ABNORMAL LOW (ref >=50)
LDL Cholesterol (Calc): 81 mg/dL (calc)
Non-HDL Cholesterol (Calc): 105 mg/dL (calc) (ref <130)
Total CHOL/HDL Ratio: 4.5 (calc) (ref <5.0)
Triglycerides: 141 mg/dL (ref <150)

## 2020-01-25 LAB — HEMOGLOBIN A1C
Hgb A1c MFr Bld: 5.8 % of total Hgb — ABNORMAL HIGH (ref <5.7)
Mean Plasma Glucose: 120 (calc)
eAG (mmol/L): 6.6 (calc)

## 2020-01-30 NOTE — Progress Notes (Signed)
Patient called. Your blood tests are very stable and in fact your diabetes is actually improving which is great news!  Keep up the good work and continue with the same medications.  If you have any questions, do not hesitate to contact me through [place].

## 2020-02-01 ENCOUNTER — Other Ambulatory Visit (HOSPITAL_COMMUNITY): Payer: Self-pay | Admitting: Internal Medicine

## 2020-02-07 ENCOUNTER — Other Ambulatory Visit (INDEPENDENT_AMBULATORY_CARE_PROVIDER_SITE_OTHER): Payer: Self-pay | Admitting: Internal Medicine

## 2020-02-21 ENCOUNTER — Encounter (INDEPENDENT_AMBULATORY_CARE_PROVIDER_SITE_OTHER): Payer: Self-pay | Admitting: Internal Medicine

## 2020-02-21 ENCOUNTER — Ambulatory Visit (INDEPENDENT_AMBULATORY_CARE_PROVIDER_SITE_OTHER): Payer: PPO | Admitting: Internal Medicine

## 2020-02-21 ENCOUNTER — Other Ambulatory Visit: Payer: Self-pay

## 2020-02-21 VITALS — BP 140/80 | HR 63 | Temp 97.0°F | Ht 69.0 in | Wt 193.6 lb

## 2020-02-21 DIAGNOSIS — R32 Unspecified urinary incontinence: Secondary | ICD-10-CM

## 2020-02-21 NOTE — Progress Notes (Signed)
Metrics: Intervention Frequency ACO  Documented Smoking Status Yearly  Screened one or more times in 24 months  Cessation Counseling or  Active cessation medication Past 24 months  Past 24 months   Guideline developer: UpToDate (See UpToDate for funding source) Date Released: 2014       Wellness Office Visit  Subjective:  Patient ID: Katherine Joseph, female    DOB: 1935/01/11  Age: 85 y.o. MRN: [id number]  CC: This lady comes in for follow-up of her diarrhea.  HPI  I was concerned about her thyroid on the last visit but this seems to have completely resolved.  However, she is now bothered by urinary incontinence.  She tells me that if she even stands up on her kitchen floor, she has urinary incontinence.  She does not describe urge or stress incontinence.  Her blood work indicated much improved control over prediabetes on the last visit. She was seen by gynecology in 2018 and there was no indication of prolapse uterus at that point.  She denies any kind of lump that she feels through the vagina.  Past Medical History:  Diagnosis Date  . Fluttering muscles SEP 2009   MBS/BASW NL  . GERD (gastroesophageal reflux disease)   . Hemorrhoids, internal OCT 2011  . Hereditary and idiopathic peripheral neuropathy 10/04/2016  . Hyperlipidemia   . Hypertension   . IBS (irritable bowel syndrome)   . Prediabetes    Past Surgical History:  Procedure Laterality Date  . COLONOSCOPY  2004   RMR/NUR INFLAMMATORY POLYP, POST POLYPECTOMY BLEED  . COLONOSCOPY  oct 2011 SLF nvd-WEIGHT LOSS   NL TI, [place]/DC TICS  . ESOPHAGOGASTRODUODENOSCOPY  2006   W/DILATION W/RMR  . KNEE ARTHROSCOPY    . RIGHT FOOT SURGERY    . TOTAL KNEE ARTHROPLASTY Left 05/21/2015   Procedure: TOTAL KNEE ARTHROPLASTY;  Surgeon: Carole Civil, MD;  Location: [facility];  Service: Orthopedics;  Laterality: Left;     Family History  Problem Relation Age of Onset  . Alzheimer's disease Mother   . Dementia Mother   .  Cancer Father        stomach  . Alcohol abuse Brother   . Hypertension Other   . Cancer Other   . Colon polyps Neg Hx   . Colon cancer Neg Hx     Social History   Social History Narrative   Lives at home alone   Right-handed   Caffeine: no coffee, occasional clear soda   Social History   Tobacco Use  . Smoking status: Never Smoker  . Smokeless tobacco: Current User    Types: Snuff  Substance Use Topics  . Alcohol use: No    Current Meds  Medication Sig  . amLODipine (NORVASC) 10 MG tablet TAKE ONE TABLET BY MOUTH DAILY.  Marland Kitchen aspirin 81 MG tablet Take 81 mg by mouth every morning.   Marland Kitchen atorvastatin (LIPITOR) 20 MG tablet TAKE ONE TABLET BY MOUTH DAILY  . divalproex (DEPAKOTE ER) 250 MG 24 hr tablet TAKE THREE (3) TABLETS BY MOUTH EVERY NIGHT.  . DULoxetine (CYMBALTA) 60 MG capsule TAKE ONE CAPSULE BY MOUTH DAILY.  Marland Kitchen gabapentin (NEURONTIN) 300 MG capsule TAKE 1 OR 2 CAPSULES BY MOUTH UP TO THREE TIMES DAILY FOR BURNING PAIN IN FEET.  . metoprolol tartrate (LOPRESSOR) 25 MG tablet TAKE ONE TABLET BY MOUTH TWICE DAILY  . omeprazole (PRILOSEC) 20 MG capsule TAKE ONE CAPSULE BY MOUTH DAILY.  Marland Kitchen potassium chloride SA (KLOR-CON) 20 MEQ tablet TAKE  ONE TABLET BY MOUTH DAILY  . torsemide (DEMADEX) 20 MG tablet TAKE ONE TABLET BY MOUTH AS NEEDED.      Depression screen PHQ 2/9 08/31/2019  Decreased Interest 0  Down, Depressed, Hopeless 0  PHQ - 2 Score 0     Objective:   Today's Vitals: BP 140/80 (BP Location: Left Arm, Patient Position: Sitting, Cuff Size: Normal)   Pulse 63   Temp (!) 97 F (36.1 C) (Temporal)   Ht 5\' 9"  (1.753 m)   Wt 193 lb 9.6 oz (87.8 kg)   SpO2 98%   BMI 28.59 kg/m  Vitals with BMI 02/21/2020 01/24/2020 12/06/2019  Height 5\' 9"  5\' 9"  5\' 9"   Weight 193 lbs 10 oz 202 lbs 13 oz 204 lbs  BMI 28.58 123456 Q000111Q  Systolic XX123456 [id number] Q000111Q  Diastolic 80 70 78  Pulse 63 71 68     Physical Exam  She looks systemically well.  She has lost 9 pounds since her  last visit.     Assessment   1. Urinary incontinence, unspecified type       Tests ordered Orders Placed This Encounter  Procedures  . Urinalysis w microscopic + reflex cultur  . Ambulatory referral to Urology     Plan: 1. I will send a urinalysis and possible culture to make sure that this is not a urine infection but I will also refer to urology for further investigation.  She probably needs bladder studies. 2. Follow-up as scheduled with Judson Roch in December.   No orders of the defined types were placed in this encounter.   Doree Albee, MD

## 2020-02-22 LAB — URINALYSIS W MICROSCOPIC + REFLEX CULTURE
Bacteria, UA: NONE SEEN /HPF
Bilirubin Urine: NEGATIVE
Glucose, UA: NEGATIVE
Hgb urine dipstick: NEGATIVE
Hyaline Cast: NONE SEEN /LPF
Leukocyte Esterase: NEGATIVE
Nitrites, Initial: NEGATIVE
Specific Gravity, Urine: 1.032 (ref 1.001–1.03)
pH: 6 (ref 5.0–8.0)

## 2020-02-22 LAB — NO CULTURE INDICATED

## 2020-02-25 ENCOUNTER — Other Ambulatory Visit (INDEPENDENT_AMBULATORY_CARE_PROVIDER_SITE_OTHER): Payer: Self-pay | Admitting: Internal Medicine

## 2020-02-25 NOTE — Progress Notes (Signed)
Please let this patient know that urinalysis was mostly negative and there was no evidence of urine infection.  We will wait to see what the urologist says.

## 2020-03-05 ENCOUNTER — Other Ambulatory Visit (INDEPENDENT_AMBULATORY_CARE_PROVIDER_SITE_OTHER): Payer: Self-pay | Admitting: Internal Medicine

## 2020-03-05 MED ORDER — DIVALPROEX SODIUM ER 250 MG PO TB24: ORAL_TABLET | ORAL | 3 refills | Status: DC

## 2020-03-10 ENCOUNTER — Other Ambulatory Visit (HOSPITAL_COMMUNITY): Payer: Self-pay | Admitting: Internal Medicine

## 2020-04-03 ENCOUNTER — Other Ambulatory Visit (INDEPENDENT_AMBULATORY_CARE_PROVIDER_SITE_OTHER): Payer: Self-pay

## 2020-04-03 DIAGNOSIS — G4489 Other headache syndrome: Secondary | ICD-10-CM

## 2020-04-03 NOTE — Progress Notes (Signed)
Pt called stated Dr Merlene Laughter office ask for her to get a new referral to come back. Sending new updated referral for headache syndrome.

## 2020-04-09 DIAGNOSIS — D332 Benign neoplasm of brain, unspecified: Secondary | ICD-10-CM | POA: Diagnosis not present

## 2020-04-09 DIAGNOSIS — R42 Dizziness and giddiness: Secondary | ICD-10-CM | POA: Diagnosis not present

## 2020-04-09 DIAGNOSIS — R519 Headache, unspecified: Secondary | ICD-10-CM | POA: Diagnosis not present

## 2020-04-09 DIAGNOSIS — F40240 Claustrophobia: Secondary | ICD-10-CM | POA: Diagnosis not present

## 2020-04-10 ENCOUNTER — Other Ambulatory Visit: Payer: Self-pay | Admitting: Neurology

## 2020-04-10 ENCOUNTER — Other Ambulatory Visit (HOSPITAL_COMMUNITY): Payer: Self-pay | Admitting: Neurology

## 2020-04-10 DIAGNOSIS — R519 Headache, unspecified: Secondary | ICD-10-CM

## 2020-04-14 ENCOUNTER — Encounter: Payer: Self-pay | Admitting: Urology

## 2020-04-14 ENCOUNTER — Other Ambulatory Visit: Payer: Self-pay

## 2020-04-14 ENCOUNTER — Ambulatory Visit (INDEPENDENT_AMBULATORY_CARE_PROVIDER_SITE_OTHER): Payer: PPO | Admitting: Urology

## 2020-04-14 VITALS — BP 127/63 | HR 66 | Temp 96.6°F | Ht 69.0 in | Wt 195.0 lb

## 2020-04-14 DIAGNOSIS — R351 Nocturia: Secondary | ICD-10-CM | POA: Insufficient documentation

## 2020-04-14 DIAGNOSIS — R32 Unspecified urinary incontinence: Secondary | ICD-10-CM | POA: Diagnosis not present

## 2020-04-14 LAB — BLADDER SCAN AMB NON-IMAGING: Scan Result: 22

## 2020-04-14 MED ORDER — MIRABEGRON ER 25 MG PO TB24
25.0000 mg | ORAL_TABLET | Freq: Every day | ORAL | 1 refills | Status: DC
Start: 2020-04-14 — End: 2020-06-30

## 2020-04-14 NOTE — Patient Instructions (Signed)

## 2020-04-14 NOTE — Progress Notes (Signed)

## 2020-04-14 NOTE — Progress Notes (Signed)
04/14/2020 4:10 PM   Katherine Joseph [birth year] [id number]  Referring provider: Doree Albee, MD [place],  [place] [address]  Nocturia and urge incontinence  HPI: Katherine Joseph is a 85yo here for evaluation of nocturia and urge incontinence.  months ago she developed new onset nocturia and nocturnal enuresis. She denies any daytime urinary issues. She denies daytime urgency or frequency. She has lower extremity edema and she wears compression stockings. No OSA. No dysuria or hematuria.    PMH: Past Medical History:  Diagnosis Date  . Arthritis   . Fluttering muscles SEP 2009   MBS/BASW NL  . GERD (gastroesophageal reflux disease)   . Hemorrhoids, internal OCT 2011  . Hereditary and idiopathic peripheral neuropathy 10/04/2016  . Hyperlipidemia   . Hypertension   . IBS (irritable bowel syndrome)   . Prediabetes     Surgical History: Past Surgical History:  Procedure Laterality Date  . COLONOSCOPY  2004   RMR/NUR INFLAMMATORY POLYP, POST POLYPECTOMY BLEED  . COLONOSCOPY  oct 2011 SLF nvd-WEIGHT LOSS   NL TI, [place]/DC TICS  . ESOPHAGOGASTRODUODENOSCOPY  2006   W/DILATION W/RMR  . KNEE ARTHROSCOPY    . RIGHT FOOT SURGERY    . TOTAL KNEE ARTHROPLASTY Left 05/21/2015   Procedure: TOTAL KNEE ARTHROPLASTY;  Surgeon: Carole Civil, MD;  Location: [facility];  Service: Orthopedics;  Laterality: Left;    Home Medications:  Allergies as of 04/14/2020      Reactions   Sulfa Antibiotics Hives   Iodinated Diagnostic Agents Rash      Medication List       Accurate as of April 14, 2020  4:10 PM. If you have any questions, ask your nurse or doctor.        amLODipine 10 MG tablet Commonly known as: NORVASC TAKE ONE TABLET BY MOUTH DAILY.   aspirin 81 MG tablet Take 81 mg by mouth every morning.   atorvastatin 20 MG tablet Commonly known as: LIPITOR TAKE ONE TABLET BY MOUTH DAILY   divalproex 250 MG 24 hr tablet Commonly known as: DEPAKOTE ER TAKE THREE  (3) TABLETS BY MOUTH EVERY NIGHT.   DULoxetine 60 MG capsule Commonly known as: CYMBALTA TAKE ONE CAPSULE BY MOUTH DAILY.   gabapentin 300 MG capsule Commonly known as: NEURONTIN TAKE 1 OR 2 CAPSULES BY MOUTH UP TO THREE TIMES DAILY FOR BURNING PAIN IN FEET.   metoprolol tartrate 25 MG tablet Commonly known as: LOPRESSOR TAKE ONE TABLET BY MOUTH TWICE DAILY   omeprazole 20 MG capsule Commonly known as: PRILOSEC TAKE ONE CAPSULE BY MOUTH DAILY.   potassium chloride SA 20 MEQ tablet Commonly known as: KLOR-CON TAKE ONE TABLET BY MOUTH DAILY   torsemide 20 MG tablet Commonly known as: DEMADEX TAKE ONE TABLET BY MOUTH AS NEEDED.       Allergies:  Allergies  Allergen Reactions  . Sulfa Antibiotics Hives  . Iodinated Diagnostic Agents Rash    Family History: Family History  Problem Relation Age of Onset  . Alzheimer's disease Mother   . Dementia Mother   . Cancer Father        stomach  . Alcohol abuse Brother   . Hypertension Other   . Cancer Other   . Colon polyps Neg Hx   . Colon cancer Neg Hx     Social History:  reports that she has never smoked. Her smokeless tobacco use includes snuff. She reports that she does not drink alcohol and does  not use drugs.  ROS: All other review of systems were reviewed and are negative except what is noted above in HPI  Physical Exam: BP (!) 127/63   Pulse 66   Temp (!) 96.6 F (35.9 C)   Ht 5\' 9"  (1.753 m)   Wt 195 lb (88.5 kg)   BMI 28.80 kg/m   Constitutional:  Alert and oriented, No acute distress. HEENT: [place] AT, moist mucus membranes.  Trachea midline, no masses. Cardiovascular: No clubbing, cyanosis, or edema. Respiratory: Normal respiratory effort, no increased work of breathing. GI: Abdomen is soft, nontender, nondistended, no abdominal masses GU: No CVA tenderness.  Lymph: No cervical or inguinal lymphadenopathy. Skin: No rashes, bruises or suspicious lesions. Neurologic: Grossly intact, no focal deficits,  moving all 4 extremities. Psychiatric: Normal mood and affect.  Laboratory Data: Lab Results  Component Value Date   WBC 8.5 01/24/2020   HGB 12.2 01/24/2020   HCT 39.8 01/24/2020   MCV 77.3 (L) 01/24/2020   PLT 202 01/24/2020    Lab Results  Component Value Date   CREATININE 1.05 (H) 01/24/2020    No results found for: PSA  No results found for: TESTOSTERONE  Lab Results  Component Value Date   HGBA1C 5.8 (H) 01/24/2020    Urinalysis    Component Value Date/Time   COLORURINE DARK YELLOW 02/21/2020 1136   APPEARANCEUR CLEAR 02/21/2020 1136   LABSPEC 1.032 02/21/2020 1136   PHURINE 6.0 02/21/2020 1136   GLUCOSEU NEGATIVE 02/21/2020 1136   HGBUR NEGATIVE 02/21/2020 1136   [place] NEGATIVE 05/14/2016 1945   KETONESUR TRACE (A) 02/21/2020 1136   PROTEINUR TRACE (A) 02/21/2020 1136   NITRITE NEGATIVE 05/14/2016 1945   LEUKOCYTESUR NEGATIVE 05/14/2016 1945    Lab Results  Component Value Date   BACTERIA NONE SEEN 02/21/2020    Pertinent Imaging:  Results for orders placed during the hospital encounter of 09/24/08  DG Abd 1 View  Narrative Clinical Data: Left renal calculus, follow-up  ABDOMEN - 1 VIEW  Comparison: 03/27/2008  Findings: Diffuse bony demineralization. Levoconvex scoliosis lumbar spine. No definite left renal calculus identified. Small pelvic phleboliths stable. Slight prominent stool in proximal half of colon. Bowel gas pattern otherwise normal. No definite urinary tract calcification identified.  IMPRESSION: No definite urinary tract calcification seen.  Provider: Briscoe Burns  No results found for this or any previous visit.  No results found for this or any previous visit.  No results found for this or any previous visit.  No results found for this or any previous visit.  No results found for this or any previous visit.  No results found for this or any previous visit.  No results found for this or any previous  visit.   Assessment & Plan:    1. Urinary incontinence, unspecified type -We will trial mirabegron 25mg  daily - Urinalysis, Routine w reflex microscopic - BLADDER SCAN AMB NON-IMAGING  2. Nocturia: -mirabegron 25mg     No follow-ups on file.  Nicolette Bang, MD  [facility]

## 2020-04-15 LAB — URINALYSIS, ROUTINE W REFLEX MICROSCOPIC
Bilirubin, UA: NEGATIVE
Glucose, UA: NEGATIVE
Leukocytes,UA: NEGATIVE
Nitrite, UA: NEGATIVE
Protein,UA: NEGATIVE
RBC, UA: NEGATIVE
Specific Gravity, UA: 1.015 (ref 1.005–1.030)
Urobilinogen, Ur: 0.2 mg/dL (ref 0.2–1.0)
pH, UA: 8.5 — ABNORMAL HIGH (ref 5.0–7.5)

## 2020-04-15 LAB — MICROSCOPIC EXAMINATION
Renal Epithel, UA: NONE SEEN /hpf
WBC, UA: >30 /hpf — AB (ref 0–5)

## 2020-04-23 ENCOUNTER — Other Ambulatory Visit (HOSPITAL_COMMUNITY): Payer: Self-pay | Admitting: Internal Medicine

## 2020-05-01 ENCOUNTER — Ambulatory Visit (HOSPITAL_COMMUNITY)
Admission: RE | Admit: 2020-05-01 | Discharge: 2020-05-01 | Disposition: A | Discharge status: Home / Self Care | Payer: PPO | Source: Ambulatory Visit | Attending: Neurology | Admitting: Neurology

## 2020-05-01 ENCOUNTER — Other Ambulatory Visit: Payer: Self-pay

## 2020-05-01 DIAGNOSIS — G319 Degenerative disease of nervous system, unspecified: Secondary | ICD-10-CM | POA: Diagnosis not present

## 2020-05-01 DIAGNOSIS — R519 Headache, unspecified: Secondary | ICD-10-CM | POA: Insufficient documentation

## 2020-05-01 DIAGNOSIS — I6782 Cerebral ischemia: Secondary | ICD-10-CM | POA: Diagnosis not present

## 2020-05-01 DIAGNOSIS — H538 Other visual disturbances: Secondary | ICD-10-CM | POA: Diagnosis not present

## 2020-05-01 DIAGNOSIS — G930 Cerebral cysts: Secondary | ICD-10-CM | POA: Diagnosis not present

## 2020-05-08 ENCOUNTER — Other Ambulatory Visit (INDEPENDENT_AMBULATORY_CARE_PROVIDER_SITE_OTHER): Payer: Self-pay | Admitting: Internal Medicine

## 2020-05-16 ENCOUNTER — Other Ambulatory Visit (HOSPITAL_COMMUNITY): Payer: Self-pay | Admitting: Internal Medicine

## 2020-05-21 ENCOUNTER — Other Ambulatory Visit: Payer: Self-pay

## 2020-05-21 ENCOUNTER — Encounter: Payer: Self-pay | Admitting: Urology

## 2020-05-21 ENCOUNTER — Ambulatory Visit (INDEPENDENT_AMBULATORY_CARE_PROVIDER_SITE_OTHER): Payer: PPO | Admitting: Urology

## 2020-05-21 VITALS — BP 149/75 | HR 63 | Temp 97.6°F

## 2020-05-21 DIAGNOSIS — R32 Unspecified urinary incontinence: Secondary | ICD-10-CM

## 2020-05-21 DIAGNOSIS — R351 Nocturia: Secondary | ICD-10-CM

## 2020-05-21 LAB — URINALYSIS, ROUTINE W REFLEX MICROSCOPIC
Bilirubin, UA: NEGATIVE
Glucose, UA: NEGATIVE
Ketones, UA: NEGATIVE
Leukocytes,UA: NEGATIVE
Nitrite, UA: NEGATIVE
Protein,UA: NEGATIVE
RBC, UA: NEGATIVE
Specific Gravity, UA: 1.02 (ref 1.005–1.030)
Urobilinogen, Ur: 0.2 mg/dL (ref 0.2–1.0)
pH, UA: 8.5 — ABNORMAL HIGH (ref 5.0–7.5)

## 2020-05-21 LAB — BLADDER SCAN AMB NON-IMAGING: Scan Result: 51.4

## 2020-05-21 NOTE — Progress Notes (Signed)
05/21/2020 2:41 PM   Katherine Joseph 1934/12/24 [id number]  Referring provider: Doree Albee, MD [address] [place],  [place] [address]  Nocturia  HPI: Katherine Joseph is a 85yo here for followup for nocturia and OAB. Last visit she was started on mirabegron 25mg  and noted improvement in her nocturia and nocturnal enuresis but no improvement in her urgency and urge incontinence. No UTI. No dysuria.    PMH: Past Medical History:  Diagnosis Date  . Arthritis   . Fluttering muscles SEP 2009   MBS/BASW NL  . GERD (gastroesophageal reflux disease)   . Hemorrhoids, internal OCT 2011  . Hereditary and idiopathic peripheral neuropathy 10/04/2016  . Hyperlipidemia   . Hypertension   . IBS (irritable bowel syndrome)   . Prediabetes     Surgical History: Past Surgical History:  Procedure Laterality Date  . COLONOSCOPY  2004   RMR/NUR INFLAMMATORY POLYP, POST POLYPECTOMY BLEED  . COLONOSCOPY  oct 2011 SLF nvd-WEIGHT LOSS   NL TI, [place]/DC TICS  . ESOPHAGOGASTRODUODENOSCOPY  2006   W/DILATION W/RMR  . KNEE ARTHROSCOPY    . RIGHT FOOT SURGERY    . TOTAL KNEE ARTHROPLASTY Left 05/21/2015   Procedure: TOTAL KNEE ARTHROPLASTY;  Surgeon: Carole Civil, MD;  Location: [facility];  Service: Orthopedics;  Laterality: Left;    Home Medications:  Allergies as of 05/21/2020      Reactions   Sulfa Antibiotics Hives   Iodinated Diagnostic Agents Rash      Medication List       Accurate as of May 21, 2020  2:41 PM. If you have any questions, ask your nurse or doctor.        amLODipine 10 MG tablet Commonly known as: NORVASC TAKE ONE TABLET BY MOUTH DAILY.   aspirin 81 MG tablet Take 81 mg by mouth every morning.   atorvastatin 20 MG tablet Commonly known as: LIPITOR TAKE ONE TABLET BY MOUTH DAILY   divalproex 250 MG 24 hr tablet Commonly known as: DEPAKOTE ER TAKE THREE (3) TABLETS BY MOUTH EVERY NIGHT.   DULoxetine 60 MG capsule Commonly known as: CYMBALTA TAKE  ONE CAPSULE BY MOUTH DAILY   gabapentin 300 MG capsule Commonly known as: NEURONTIN TAKE 1 OR 2 CAPSULES BY MOUTH UP TO THREE TIMES DAILY FOR BURNING PAIN IN FEET.   metoprolol tartrate 25 MG tablet Commonly known as: LOPRESSOR TAKE ONE TABLET BY MOUTH TWICE DAILY   mirabegron ER 25 MG Tb24 tablet Commonly known as: MYRBETRIQ Take 1 tablet (25 mg total) by mouth daily.   omeprazole 20 MG capsule Commonly known as: PRILOSEC TAKE ONE CAPSULE BY MOUTH DAILY.   potassium chloride SA 20 MEQ tablet Commonly known as: KLOR-CON TAKE ONE TABLET BY MOUTH DAILY   torsemide 20 MG tablet Commonly known as: DEMADEX TAKE ONE TABLET BY MOUTH AS NEEDED.       Allergies:  Allergies  Allergen Reactions  . Sulfa Antibiotics Hives  . Iodinated Diagnostic Agents Rash    Family History: Family History  Problem Relation Age of Onset  . Alzheimer's disease Mother   . Dementia Mother   . Cancer Father        stomach  . Alcohol abuse Brother   . Hypertension Other   . Cancer Other   . Colon polyps Neg Hx   . Colon cancer Neg Hx     Social History:  reports that she has never smoked. Her smokeless tobacco use includes snuff. She reports  that she does not drink alcohol and does not use drugs.  ROS: All other review of systems were reviewed and are negative except what is noted above in HPI  Physical Exam: BP (!) 149/75   Pulse 63   Temp 97.6 F (36.4 C)   Constitutional:  Alert and oriented, No acute distress. HEENT: [place] AT, moist mucus membranes.  Trachea midline, no masses. Cardiovascular: No clubbing, cyanosis, or edema. Respiratory: Normal respiratory effort, no increased work of breathing. GI: Abdomen is soft, nontender, nondistended, no abdominal masses GU: No CVA tenderness.  Lymph: No cervical or inguinal lymphadenopathy. Skin: No rashes, bruises or suspicious lesions. Neurologic: Grossly intact, no focal deficits, moving all 4 extremities. Psychiatric: Normal mood and  affect.  Laboratory Data: Lab Results  Component Value Date   WBC 8.5 01/24/2020   HGB 12.2 01/24/2020   HCT 39.8 01/24/2020   MCV 77.3 (L) 01/24/2020   PLT 202 01/24/2020    Lab Results  Component Value Date   CREATININE 1.05 (H) 01/24/2020    No results found for: PSA  No results found for: TESTOSTERONE  Lab Results  Component Value Date   HGBA1C 5.8 (H) 01/24/2020    Urinalysis    Component Value Date/Time   COLORURINE DARK YELLOW 02/21/2020 1136   APPEARANCEUR Hazy (A) 04/14/2020 1623   LABSPEC 1.032 02/21/2020 1136   PHURINE 6.0 02/21/2020 1136   GLUCOSEU Negative 04/14/2020 1623   HGBUR NEGATIVE 02/21/2020 1136   BILIRUBINUR Negative 04/14/2020 1623   KETONESUR TRACE (A) 02/21/2020 1136   PROTEINUR Negative 04/14/2020 1623   PROTEINUR TRACE (A) 02/21/2020 1136   NITRITE Negative 04/14/2020 1623   NITRITE NEGATIVE 05/14/2016 1945   LEUKOCYTESUR Negative 04/14/2020 1623    Lab Results  Component Value Date   LABMICR See below: 04/14/2020   WBCUA >30 (A) 04/14/2020   LABEPIT 0-10 04/14/2020   BACTERIA Few (A) 04/14/2020    Pertinent Imaging:  Results for orders placed during the hospital encounter of 09/24/08  DG Abd 1 View  Narrative Clinical Data: Left renal calculus, follow-up  ABDOMEN - 1 VIEW  Comparison: 03/27/2008  Findings: Diffuse bony demineralization. Levoconvex scoliosis lumbar spine. No definite left renal calculus identified. Small pelvic phleboliths stable. Slight prominent stool in proximal half of colon. Bowel gas pattern otherwise normal. No definite urinary tract calcification identified.  IMPRESSION: No definite urinary tract calcification seen.  Provider: Briscoe Burns  No results found for this or any previous visit.  No results found for this or any previous visit.  No results found for this or any previous visit.  No results found for this or any previous visit.  No results found for this or any  previous visit.  No results found for this or any previous visit.  No results found for this or any previous visit.   Assessment & Plan:    1. Urinary incontinence, unspecified type -We will increase mirabegron to 50mg   -RTC 4-6 weeks - Urinalysis, Routine w reflex microscopic - BLADDER SCAN AMB NON-IMAGING  2. Nocturia -increase mirabegron 50mg     No follow-ups on file.  Nicolette Bang, MD  [facility] [place]

## 2020-05-21 NOTE — Progress Notes (Signed)
Urological Symptom Review  Patient is experiencing the following symptoms: Get up at night to urinate   Review of Systems  Gastrointestinal (upper)  : Negative for upper GI symptoms  Gastrointestinal (lower) : Negative for lower GI symptoms  Constitutional : Negative for symptoms  Skin: Negative for skin symptoms  Eyes: Negative for eye symptoms  Ear/Nose/Throat : Negative for Ear/Nose/Throat symptoms  Hematologic/Lymphatic: Negative for Hematologic/Lymphatic symptoms  Cardiovascular : Negative for cardiovascular symptoms  Respiratory : Negative for respiratory symptoms  Endocrine: Negative for endocrine symptoms  Musculoskeletal: Joint pain  Neurological: Negative for neurological symptoms  Psychologic: Negative for psychiatric symptoms  

## 2020-05-21 NOTE — Patient Instructions (Signed)

## 2020-06-02 ENCOUNTER — Ambulatory Visit: Payer: PPO

## 2020-06-02 ENCOUNTER — Ambulatory Visit (INDEPENDENT_AMBULATORY_CARE_PROVIDER_SITE_OTHER): Payer: PPO | Admitting: Orthopedic Surgery

## 2020-06-02 ENCOUNTER — Encounter: Payer: Self-pay | Admitting: Orthopedic Surgery

## 2020-06-02 ENCOUNTER — Other Ambulatory Visit (INDEPENDENT_AMBULATORY_CARE_PROVIDER_SITE_OTHER): Payer: Self-pay

## 2020-06-02 ENCOUNTER — Other Ambulatory Visit: Payer: Self-pay

## 2020-06-02 VITALS — BP 146/78 | HR 71 | Ht 69.0 in | Wt 197.0 lb

## 2020-06-02 DIAGNOSIS — M1710 Unilateral primary osteoarthritis, unspecified knee: Secondary | ICD-10-CM

## 2020-06-02 DIAGNOSIS — M1711 Unilateral primary osteoarthritis, right knee: Secondary | ICD-10-CM

## 2020-06-02 DIAGNOSIS — Z96652 Presence of left artificial knee joint: Secondary | ICD-10-CM

## 2020-06-02 DIAGNOSIS — M5416 Radiculopathy, lumbar region: Secondary | ICD-10-CM | POA: Diagnosis not present

## 2020-06-02 MED ORDER — PREDNISONE 10 MG PO TABS: 10.0000 mg | ORAL_TABLET | Freq: Three times a day (TID) | ORAL | 0 refills | Status: AC

## 2020-06-02 MED ORDER — AMLODIPINE BESYLATE 10 MG PO TABS: 10.0000 mg | ORAL_TABLET | Freq: Every day | ORAL | 1 refills | Status: DC

## 2020-06-02 NOTE — Progress Notes (Signed)
Chief Complaint  Patient presents with  . Routine Post Op    left 05/21/15  . Leg Problem    left leg burns all the way down into toes     POY # 5 LEFT TKA   Overall left total knee functioning well no problems  Right knee lateral joint line pain stiffness occasional aching  Review of systems burning leg pain down to her toes she is on gabapentin  Left knee full extension 115 degrees of flexion feels stable neurovascular exam is intact skin incision looks normal  Right knee is tender in the lateral joint line small effusion still bend and straighten the knee well feels stable neurovascular exam otherwise intact  Encounter Diagnoses  Name Primary?  Marland Kitchen Arthritis of knee - RIGHT  Yes  . Radiculopathy, lumbar region   . Status post total left knee replacement      Meds ordered this encounter  Medications  . predniSONE (DELTASONE) 10 MG tablet    Sig: Take 1 tablet (10 mg total) by mouth 3 (three) times daily for 14 days.    Dispense:  42 tablet    Refill:  0   Procedure note right knee injection   verbal consent was obtained to inject right knee joint  Timeout was completed to confirm the site of injection  The medications used were 40 mg of Depo-Medrol and 1% lidocaine 3 cc  Anesthesia was provided by ethyl chloride and the skin was prepped with alcohol.  After cleaning the skin with alcohol a 20-gauge needle was used to inject the right knee joint. There were no complications. A sterile bandage was applied.

## 2020-06-02 NOTE — Patient Instructions (Signed)

## 2020-06-04 DIAGNOSIS — I10 Essential (primary) hypertension: Secondary | ICD-10-CM | POA: Diagnosis not present

## 2020-06-04 DIAGNOSIS — D332 Benign neoplasm of brain, unspecified: Secondary | ICD-10-CM | POA: Insufficient documentation

## 2020-06-04 DIAGNOSIS — R42 Dizziness and giddiness: Secondary | ICD-10-CM | POA: Diagnosis not present

## 2020-06-04 DIAGNOSIS — R519 Headache, unspecified: Secondary | ICD-10-CM | POA: Diagnosis not present

## 2020-06-11 ENCOUNTER — Other Ambulatory Visit (HOSPITAL_COMMUNITY): Payer: Self-pay | Admitting: Internal Medicine

## 2020-06-16 ENCOUNTER — Other Ambulatory Visit (HOSPITAL_COMMUNITY): Payer: Self-pay | Admitting: Internal Medicine

## 2020-06-25 ENCOUNTER — Encounter: Payer: Self-pay | Admitting: Internal Medicine

## 2020-06-30 ENCOUNTER — Ambulatory Visit (INDEPENDENT_AMBULATORY_CARE_PROVIDER_SITE_OTHER): Payer: PPO | Admitting: Urology

## 2020-06-30 ENCOUNTER — Other Ambulatory Visit: Payer: Self-pay

## 2020-06-30 ENCOUNTER — Encounter: Payer: Self-pay | Admitting: Urology

## 2020-06-30 ENCOUNTER — Other Ambulatory Visit (INDEPENDENT_AMBULATORY_CARE_PROVIDER_SITE_OTHER): Payer: Self-pay

## 2020-06-30 ENCOUNTER — Other Ambulatory Visit (INDEPENDENT_AMBULATORY_CARE_PROVIDER_SITE_OTHER): Payer: Self-pay | Admitting: Internal Medicine

## 2020-06-30 VITALS — BP 130/78 | HR 68

## 2020-06-30 DIAGNOSIS — R32 Unspecified urinary incontinence: Secondary | ICD-10-CM

## 2020-06-30 LAB — URINALYSIS, ROUTINE W REFLEX MICROSCOPIC
Bilirubin, UA: NEGATIVE
Glucose, UA: NEGATIVE
Ketones, UA: NEGATIVE
Leukocytes,UA: NEGATIVE
Nitrite, UA: NEGATIVE
Protein,UA: NEGATIVE
RBC, UA: NEGATIVE
Specific Gravity, UA: 1.015 (ref 1.005–1.030)
Urobilinogen, Ur: 0.2 mg/dL (ref 0.2–1.0)
pH, UA: 8.5 — ABNORMAL HIGH (ref 5.0–7.5)

## 2020-06-30 LAB — BLADDER SCAN AMB NON-IMAGING: Scan Result: 10

## 2020-06-30 MED ORDER — POTASSIUM CHLORIDE CRYS ER 20 MEQ PO TBCR: 20.0000 meq | EXTENDED_RELEASE_TABLET | Freq: Every day | ORAL | 3 refills | Status: DC

## 2020-06-30 MED ORDER — MIRABEGRON ER 50 MG PO TB24: 50.0000 mg | ORAL_TABLET | Freq: Every day | ORAL | 11 refills | Status: DC

## 2020-06-30 NOTE — Progress Notes (Signed)
Urological Symptom Review  Patient is experiencing the following symptoms: none   Review of Systems  Gastrointestinal (upper)  : Negative for upper GI symptoms  Gastrointestinal (lower) : Negative for lower GI symptoms  Constitutional : Negative for symptoms  Skin: Negative for skin symptoms  Eyes: Negative for eye symptoms  Ear/Nose/Throat : Negative for Ear/Nose/Throat symptoms  Hematologic/Lymphatic: Negative for Hematologic/Lymphatic symptoms  Cardiovascular : Negative for cardiovascular symptoms  Respiratory : Negative for respiratory symptoms  Endocrine: Negative for endocrine symptoms  Musculoskeletal: Joint pain  Neurological: Negative for neurological symptoms  Psychologic: Negative for psychiatric symptoms  

## 2020-06-30 NOTE — Progress Notes (Signed)
06/30/2020 2:10 PM   FANNIE GATHRIGHT [birth year] [id number]  Referring provider: Doree Albee, MD [address],  [place] [address]  Followup urinary incontinence  HPI: Ms Lucien is a 85yo here for followup for urinary incontinence. Nocturia resolved with increasing mirabegron to 50mg . Urgency and urge incontinence resolved. No other significant LUTS. He is happy with her urination    PMH: Past Medical History:  Diagnosis Date  . Arthritis   . Fluttering muscles SEP 2009   MBS/BASW NL  . GERD (gastroesophageal reflux disease)   . Hemorrhoids, internal OCT 2011  . Hereditary and idiopathic peripheral neuropathy 10/04/2016  . Hyperlipidemia   . Hypertension   . IBS (irritable bowel syndrome)   . Prediabetes     Surgical History: Past Surgical History:  Procedure Laterality Date  . COLONOSCOPY  2004   RMR/NUR INFLAMMATORY POLYP, POST POLYPECTOMY BLEED  . COLONOSCOPY  oct 2011 SLF nvd-WEIGHT LOSS   NL TI, [place]/DC TICS  . ESOPHAGOGASTRODUODENOSCOPY  2006   W/DILATION W/RMR  . KNEE ARTHROSCOPY    . RIGHT FOOT SURGERY    . TOTAL KNEE ARTHROPLASTY Left 05/21/2015   Procedure: TOTAL KNEE ARTHROPLASTY;  Surgeon: Carole Civil, MD;  Location: [facility];  Service: Orthopedics;  Laterality: Left;    Home Medications:  Allergies as of 06/30/2020      Reactions   Sulfa Antibiotics Hives   Iodinated Diagnostic Agents Rash      Medication List       Accurate as of June 30, 2020  2:10 PM. If you have any questions, ask your nurse or doctor.        amLODipine 10 MG tablet Commonly known as: NORVASC Take 1 tablet (10 mg total) by mouth daily.   aspirin 81 MG tablet Take 81 mg by mouth every morning.   atorvastatin 20 MG tablet Commonly known as: LIPITOR TAKE ONE TABLET BY MOUTH DAILY   divalproex 250 MG 24 hr tablet Commonly known as: DEPAKOTE ER TAKE THREE (3) TABLETS BY MOUTH EVERY NIGHT.   DULoxetine 60 MG capsule Commonly known as:  CYMBALTA TAKE ONE CAPSULE BY MOUTH DAILY   gabapentin 300 MG capsule Commonly known as: NEURONTIN TAKE 1 OR 2 CAPSULES BY MOUTH UP TO THREE TIMES DAILY FOR BURNING PAIN IN FEET.   metoprolol tartrate 25 MG tablet Commonly known as: LOPRESSOR TAKE ONE TABLET BY MOUTH TWICE DAILY   mirabegron ER 25 MG Tb24 tablet Commonly known as: MYRBETRIQ Take 1 tablet (25 mg total) by mouth daily.   omeprazole 20 MG capsule Commonly known as: PRILOSEC TAKE ONE CAPSULE BY MOUTH DAILY.   potassium chloride SA 20 MEQ tablet Commonly known as: KLOR-CON TAKE ONE TABLET BY MOUTH DAILY   torsemide 20 MG tablet Commonly known as: DEMADEX TAKE ONE TABLET BY MOUTH AS NEEDED.       Allergies:  Allergies  Allergen Reactions  . Sulfa Antibiotics Hives  . Iodinated Diagnostic Agents Rash    Family History: Family History  Problem Relation Age of Onset  . Alzheimer's disease Mother   . Dementia Mother   . Cancer Father        stomach  . Alcohol abuse Brother   . Hypertension Other   . Cancer Other   . Colon polyps Neg Hx   . Colon cancer Neg Hx     Social History:  reports that she has never smoked. Her smokeless tobacco use includes snuff. She reports that she does not  drink alcohol and does not use drugs.  ROS: All other review of systems were reviewed and are negative except what is noted above in HPI  Physical Exam: BP 130/78   Pulse 68   Constitutional:  Alert and oriented, No acute distress. HEENT: [place] AT, moist mucus membranes.  Trachea midline, no masses. Cardiovascular: No clubbing, cyanosis, or edema. Respiratory: Normal respiratory effort, no increased work of breathing. GI: Abdomen is soft, nontender, nondistended, no abdominal masses GU: No CVA tenderness.  Lymph: No cervical or inguinal lymphadenopathy. Skin: No rashes, bruises or suspicious lesions. Neurologic: Grossly intact, no focal deficits, moving all 4 extremities. Psychiatric: Normal mood and  affect.  Laboratory Data: Lab Results  Component Value Date   WBC 8.5 01/24/2020   HGB 12.2 01/24/2020   HCT 39.8 01/24/2020   MCV 77.3 (L) 01/24/2020   PLT 202 01/24/2020    Lab Results  Component Value Date   CREATININE 1.05 (H) 01/24/2020    No results found for: PSA  No results found for: TESTOSTERONE  Lab Results  Component Value Date   HGBA1C 5.8 (H) 01/24/2020    Urinalysis    Component Value Date/Time   COLORURINE DARK YELLOW 02/21/2020 1136   APPEARANCEUR Clear 05/21/2020 1418   LABSPEC 1.032 02/21/2020 1136   PHURINE 6.0 02/21/2020 1136   GLUCOSEU Negative 05/21/2020 1418   HGBUR NEGATIVE 02/21/2020 1136   BILIRUBINUR Negative 05/21/2020 1418   KETONESUR TRACE (A) 02/21/2020 1136   PROTEINUR Negative 05/21/2020 1418   PROTEINUR TRACE (A) 02/21/2020 1136   NITRITE Negative 05/21/2020 1418   NITRITE NEGATIVE 05/14/2016 1945   LEUKOCYTESUR Negative 05/21/2020 1418    Lab Results  Component Value Date   LABMICR Comment 05/21/2020   WBCUA >30 (A) 04/14/2020   LABEPIT 0-10 04/14/2020   BACTERIA Few (A) 04/14/2020    Pertinent Imaging:  Results for orders placed during the hospital encounter of 09/24/08  DG Abd 1 View  Narrative Clinical Data: Left renal calculus, follow-up  ABDOMEN - 1 VIEW  Comparison: 03/27/2008  Findings: Diffuse bony demineralization. Levoconvex scoliosis lumbar spine. No definite left renal calculus identified. Small pelvic phleboliths stable. Slight prominent stool in proximal half of colon. Bowel gas pattern otherwise normal. No definite urinary tract calcification identified.  IMPRESSION: No definite urinary tract calcification seen.  Provider: Briscoe Burns  No results found for this or any previous visit.  No results found for this or any previous visit.  No results found for this or any previous visit.  No results found for this or any previous visit.  No results found for this or any previous  visit.  No results found for this or any previous visit.  No results found for this or any previous visit.   Assessment & Plan:    1. Urinary incontinence, unspecified type -Continue mirabegron 50mg  -RCT 6 months - BLADDER SCAN AMB NON-IMAGING - Urinalysis, Routine w reflex microscopic   No follow-ups on file.  Nicolette Bang, MD  [facility] [place]

## 2020-06-30 NOTE — Patient Instructions (Signed)

## 2020-06-30 NOTE — Telephone Encounter (Signed)
Received fax from [facility] for a refill on Potassium Cl ER 20 MEQ.  Last OV 02/21/2020  Last filled 02/25/2020, # 30 with 3 refills

## 2020-07-23 ENCOUNTER — Other Ambulatory Visit: Payer: Self-pay

## 2020-07-23 ENCOUNTER — Ambulatory Visit: Payer: PPO | Admitting: Internal Medicine

## 2020-07-23 ENCOUNTER — Encounter: Payer: Self-pay | Admitting: Internal Medicine

## 2020-07-23 VITALS — BP 122/72 | HR 67 | Temp 96.0°F | Ht 69.0 in | Wt 202.2 lb

## 2020-07-23 DIAGNOSIS — Z1211 Encounter for screening for malignant neoplasm of colon: Secondary | ICD-10-CM | POA: Diagnosis not present

## 2020-07-23 DIAGNOSIS — K219 Gastro-esophageal reflux disease without esophagitis: Secondary | ICD-10-CM | POA: Diagnosis not present

## 2020-07-23 DIAGNOSIS — K581 Irritable bowel syndrome with constipation: Secondary | ICD-10-CM | POA: Diagnosis not present

## 2020-07-23 NOTE — Progress Notes (Signed)
Primary Care Physician:  Doree Albee, MD Primary Gastroenterologist:  Dr. Abbey Chatters  Chief Complaint  Patient presents with  . Colonoscopy    wants to discuss    HPI:   Katherine Joseph is a 85 y.o. female who presents to the clinic today for follow-up visit.  She has chronic dyspepsia and reflux which is well controlled on omeprazole 20 mg daily.  Denies any dysphagia odynophagia.  No unintentional weight loss.  No abdominal pain.  Also has chronic constipation with irritable bowel syndrome which he also states is well controlled.    She states she received a letter to discuss screening colonoscopy.  Last colonoscopy in 2011 unremarkable besides hemorrhoids and diverticulosis.  No family history of colorectal malignancy.  No melena or hematochezia.  Past Medical History:  Diagnosis Date  . Arthritis   . Fluttering muscles SEP 2009   MBS/BASW NL  . GERD (gastroesophageal reflux disease)   . Hemorrhoids, internal OCT 2011  . Hereditary and idiopathic peripheral neuropathy 10/04/2016  . Hyperlipidemia   . Hypertension   . IBS (irritable bowel syndrome)   . Prediabetes     Past Surgical History:  Procedure Laterality Date  . COLONOSCOPY  2004   RMR/NUR INFLAMMATORY POLYP, POST POLYPECTOMY BLEED  . COLONOSCOPY  oct 2011 SLF nvd-WEIGHT LOSS   NL TI, [place]/DC TICS  . ESOPHAGOGASTRODUODENOSCOPY  2006   W/DILATION W/RMR  . KNEE ARTHROSCOPY    . RIGHT FOOT SURGERY    . TOTAL KNEE ARTHROPLASTY Left 05/21/2015   Procedure: TOTAL KNEE ARTHROPLASTY;  Surgeon: Carole Civil, MD;  Location: [facility];  Service: Orthopedics;  Laterality: Left;    Current Outpatient Medications  Medication Sig Dispense Refill  . amLODipine (NORVASC) 10 MG tablet Take 1 tablet (10 mg total) by mouth daily. 90 tablet 1  . aspirin 81 MG tablet Take 81 mg by mouth every morning.     Marland Kitchen atorvastatin (LIPITOR) 20 MG tablet TAKE ONE TABLET BY MOUTH DAILY 90 tablet 0  . divalproex (DEPAKOTE ER) 250 MG 24  hr tablet TAKE THREE (3) TABLETS BY MOUTH EVERY NIGHT. 90 tablet 3  . DULoxetine (CYMBALTA) 60 MG capsule TAKE ONE CAPSULE BY MOUTH DAILY 30 capsule 2  . gabapentin (NEURONTIN) 300 MG capsule TAKE 1 OR 2 CAPSULES BY MOUTH UP TO THREE TIMES DAILY FOR BURNING PAIN IN FEET. 180 capsule 3  . metoprolol tartrate (LOPRESSOR) 25 MG tablet TAKE ONE TABLET BY MOUTH TWICE DAILY 180 tablet 0  . mirabegron ER (MYRBETRIQ) 50 MG TB24 tablet Take 1 tablet (50 mg total) by mouth daily. 30 tablet 11  . omeprazole (PRILOSEC) 20 MG capsule TAKE ONE CAPSULE BY MOUTH DAILY. 90 capsule 1  . potassium chloride SA (KLOR-CON) 20 MEQ tablet Take 1 tablet (20 mEq total) by mouth daily. 30 tablet 3  . torsemide (DEMADEX) 20 MG tablet TAKE ONE TABLET BY MOUTH AS NEEDED. 30 tablet 3   No current facility-administered medications for this visit.    Allergies as of 07/23/2020 - Review Complete 07/23/2020  Allergen Reaction Noted  . Sulfa antibiotics Hives 08/29/2014  . Iodinated diagnostic agents Rash 01/13/2011    Family History  Problem Relation Age of Onset  . Alzheimer's disease Mother   . Dementia Mother   . Cancer Father        stomach  . Alcohol abuse Brother   . Hypertension Other   . Cancer Other   . Colon polyps Neg Hx   .  Colon cancer Neg Hx     Social History   Socioeconomic History  . Marital status: Widowed    Spouse name: Not on file  . Number of children: 9  . Years of education: 108  . Highest education level: Not on file  Occupational History  . Occupation: Retired  Tobacco Use  . Smoking status: Never Smoker  . Smokeless tobacco: Current User    Types: Snuff  Substance and Sexual Activity  . Alcohol use: No  . Drug use: No  . Sexual activity: Never  Other Topics Concern  . Not on file  Social History Narrative   Lives at home alone   Right-handed   Caffeine: no coffee, occasional clear soda   Social Determinants of Health   Financial Resource Strain:   . Difficulty of  Paying Living Expenses: Not on file  Food Insecurity:   . Worried About Charity fundraiser in the Last Year: Not on file  . Ran Out of Food in the Last Year: Not on file  Transportation Needs:   . Lack of Transportation (Medical): Not on file  . Lack of Transportation (Non-Medical): Not on file  Physical Activity:   . Days of Exercise per Week: Not on file  . Minutes of Exercise per Session: Not on file  Stress:   . Feeling of Stress : Not on file  Social Connections:   . Frequency of Communication with Friends and Family: Not on file  . Frequency of Social Gatherings with Friends and Family: Not on file  . Attends Religious Services: Not on file  . Active Member of Clubs or Organizations: Not on file  . Attends Archivist Meetings: Not on file  . Marital Status: Not on file  Intimate Partner Violence:   . Fear of Current or Ex-Partner: Not on file  . Emotionally Abused: Not on file  . Physically Abused: Not on file  . Sexually Abused: Not on file    Subjective: Review of Systems  Constitutional: Negative for chills and fever.  HENT: Negative for congestion and hearing loss.   Eyes: Negative for blurred vision and double vision.  Respiratory: Negative for cough and shortness of breath.   Cardiovascular: Negative for chest pain and palpitations.  Gastrointestinal: Positive for heartburn. Negative for abdominal pain, blood in stool, constipation, diarrhea, melena and vomiting.  Genitourinary: Negative for dysuria and urgency.  Musculoskeletal: Negative for joint pain and myalgias.  Skin: Negative for itching and rash.  Neurological: Negative for dizziness and headaches.  Psychiatric/Behavioral: Negative for depression. The patient is not nervous/anxious.        Objective: BP 122/72   Pulse 67   Temp (!) 96 F (35.6 C) (Temporal)   Ht 5\' 9"  (1.753 m)   Wt 202 lb 3.2 oz (91.7 kg)   BMI 29.86 kg/m  Physical Exam Constitutional:      Appearance: Normal  appearance.  HENT:     Head: Normocephalic and atraumatic.  Eyes:     Extraocular Movements: Extraocular movements intact.     Conjunctiva/sclera: Conjunctivae normal.  Cardiovascular:     Rate and Rhythm: Normal rate and regular rhythm.  Pulmonary:     Effort: Pulmonary effort is normal.     Breath sounds: Normal breath sounds.  Abdominal:     General: Bowel sounds are normal.     Palpations: Abdomen is soft.  Musculoskeletal:        General: No swelling. Normal range of motion.  Cervical back: Normal range of motion and neck supple.  Skin:    General: Skin is warm and dry.     Coloration: Skin is not jaundiced.  Neurological:     General: No focal deficit present.     Mental Status: She is alert and oriented to person, place, and time.  Psychiatric:        Mood and Affect: Mood normal.        Behavior: Behavior normal.      Assessment: *Chronic reflux/dyspepsia-well controlled on omeprazole *Chronic constipation *Colon cancer screening  Plan: In regards to patient's chronic reflux/dyspepsia, this is well controlled on omeprazole 20 mg daily.  We will continue  Her irritable bowel syndrome and chronic constipation is also currently well controlled.  In regards to colon cancer screening, discussed given her age, the current guidelines recommend that we have an informed conversation with each other about risk versus benefits.  Given her last colonoscopy was with normal, she is at low risk for polyps and colon cancer.  However, she is quite spry for her age, so I did offer colonoscopy if she would like to pursue at this time.  She would like to hold off which I believe is a reasonable choice.  I did counsel her that by not performing a colonoscopy we may be missing colon polyps and potentially colon cancer and she understands.  Patient to follow-up in 1 year or sooner if needed.  07/23/2020 3:10 PM   Disclaimer: This note was dictated with voice recognition software.  Similar sounding words can inadvertently be transcribed and may not be corrected upon review.

## 2020-07-23 NOTE — Patient Instructions (Signed)
Continue on daily omeprazole for your chronic reflux and dyspepsia.  We will hold off on colonoscopy for colon cancer screening at this time given your age.  If you develop GI symptoms, we may need to pursue this in the future.  Follow-up with GI in 1 year or sooner if needed.  At [facility] we value your feedback. You may receive a survey about your visit today. Please share your experience as we strive to create trusting relationships with our patients to provide genuine, compassionate, quality care.  We appreciate your understanding and patience as we review any laboratory studies, imaging, and other diagnostic tests that are ordered as we care for you. Our office policy is 5 business days for review of these results, and any emergent or urgent results are addressed in a timely manner for your best interest. If you do not hear from our office in 1 week, please contact us.   We also encourage the use of MyChart, which contains your medical information for your review as well. If you are not enrolled in this feature, an access code is on this after visit summary for your convenience. Thank you for allowing [place] to be involved in your care.  It was great to see you today!  I hope you have a great rest of your fall!!    Chandi Nicklin K. Abbey Chatters, D.O. Gastroenterology and Hepatology [facility]

## 2020-07-28 ENCOUNTER — Telehealth (INDEPENDENT_AMBULATORY_CARE_PROVIDER_SITE_OTHER): Payer: Self-pay

## 2020-07-28 ENCOUNTER — Other Ambulatory Visit (INDEPENDENT_AMBULATORY_CARE_PROVIDER_SITE_OTHER): Payer: Self-pay | Admitting: Internal Medicine

## 2020-07-28 ENCOUNTER — Other Ambulatory Visit (INDEPENDENT_AMBULATORY_CARE_PROVIDER_SITE_OTHER): Payer: Self-pay

## 2020-07-28 MED ORDER — ATORVASTATIN CALCIUM 20 MG PO TABS: 20.0000 mg | ORAL_TABLET | Freq: Every day | ORAL | 0 refills | Status: DC

## 2020-07-28 NOTE — Telephone Encounter (Signed)
Received a fax from [facility] in [place] that this patient needs a refill on the following medication:  atorvastatin (LIPITOR) 20 MG tablet  Last filled 04/23/2020, # 90 with 0 refills  Last OV 02/21/2020  ROV 09/08/2020

## 2020-08-18 ENCOUNTER — Other Ambulatory Visit (INDEPENDENT_AMBULATORY_CARE_PROVIDER_SITE_OTHER): Payer: Self-pay

## 2020-08-18 MED ORDER — DULOXETINE HCL 60 MG PO CPEP: 60.0000 mg | ORAL_CAPSULE | Freq: Every day | ORAL | 2 refills | Status: DC

## 2020-08-25 ENCOUNTER — Other Ambulatory Visit (HOSPITAL_COMMUNITY): Payer: Self-pay | Admitting: Internal Medicine

## 2020-08-25 DIAGNOSIS — Z1231 Encounter for screening mammogram for malignant neoplasm of breast: Secondary | ICD-10-CM

## 2020-08-29 ENCOUNTER — Other Ambulatory Visit (INDEPENDENT_AMBULATORY_CARE_PROVIDER_SITE_OTHER): Payer: Self-pay | Admitting: Internal Medicine

## 2020-09-02 DIAGNOSIS — H25049 Posterior subcapsular polar age-related cataract, unspecified eye: Secondary | ICD-10-CM | POA: Diagnosis not present

## 2020-09-08 ENCOUNTER — Encounter (INDEPENDENT_AMBULATORY_CARE_PROVIDER_SITE_OTHER): Payer: Self-pay | Admitting: Nurse Practitioner

## 2020-09-08 ENCOUNTER — Telehealth (INDEPENDENT_AMBULATORY_CARE_PROVIDER_SITE_OTHER): Payer: Self-pay

## 2020-09-08 ENCOUNTER — Ambulatory Visit (INDEPENDENT_AMBULATORY_CARE_PROVIDER_SITE_OTHER): Payer: PPO | Admitting: Nurse Practitioner

## 2020-09-08 ENCOUNTER — Other Ambulatory Visit (INDEPENDENT_AMBULATORY_CARE_PROVIDER_SITE_OTHER): Payer: Self-pay | Admitting: Internal Medicine

## 2020-09-08 ENCOUNTER — Other Ambulatory Visit: Payer: Self-pay

## 2020-09-08 VITALS — BP 164/92 | HR 68 | Temp 96.9°F | Ht 67.5 in | Wt 198.4 lb

## 2020-09-08 DIAGNOSIS — Z1159 Encounter for screening for other viral diseases: Secondary | ICD-10-CM

## 2020-09-08 DIAGNOSIS — Z0000 Encounter for general adult medical examination without abnormal findings: Secondary | ICD-10-CM | POA: Diagnosis not present

## 2020-09-08 MED ORDER — TORSEMIDE 20 MG PO TABS: 20.0000 mg | ORAL_TABLET | ORAL | 3 refills | Status: DC | PRN

## 2020-09-08 NOTE — Patient Instructions (Signed)
  Katherine Joseph , Thank you for taking time to come for your Medicare Wellness Visit. I appreciate your ongoing commitment to your health goals. Please review the following plan we discussed and let me know if I can assist you in the future.   These are the goals we discussed: Goals    . DIET - DECREASE SODA OR JUICE INTAKE    . DIET - EAT MORE FRUITS AND VEGETABLES       This is a list of the screening recommended for you and due dates:  Health Maintenance  Topic Date Due  . Tetanus Vaccine  Never done  . Pneumonia vaccines (2 of 2 - PPSV23) 02/01/2019  . Flu Shot  Completed  . DEXA scan (bone density measurement)  Completed  . COVID-19 Vaccine  Completed

## 2020-09-08 NOTE — Progress Notes (Signed)
Subjective:   Katherine Joseph is a 85 y.o. female who presents for Medicare Annual (Subsequent) preventive examination.  Review of Systems     Cardiac Risk Factors include: advanced age (>53men, >4 women);dyslipidemia;hypertension;obesity (BMI >30kg/m2);sedentary lifestyle     Objective:    Today's Vitals   09/08/20 1053 09/08/20 1057  BP: (!) 164/92   Pulse: 68   Temp: (!) 96.9 F (36.1 C)   TempSrc: Temporal   SpO2: 98%   Weight: 198 lb 6.4 oz (90 kg)   Height: 5' 7.5" (1.715 m)   PainSc:  7    Body mass index is 30.62 kg/m.  Advanced Directives 09/08/2020 08/31/2019 02/17/2017 02/08/2017 02/03/2017 01/27/2017 01/19/2017  Does Patient Have a Medical Advance Directive? Yes Yes No No No No No  Type of Advance Directive Out of facility DNR (pink MOST or yellow form) Out of facility DNR (pink MOST or yellow form) - - - - -  Does patient want to make changes to medical advance directive? No - Patient declined Yes (MAU/Ambulatory/Procedural Areas - Information given) - - - - -  Would patient like information on creating a medical advance directive? - - No - Patient declined No - Patient declined No - Patient declined No - Patient declined No - Patient declined    Current Medications (verified) Outpatient Encounter Medications as of 09/08/2020  Medication Sig  . amLODipine (NORVASC) 10 MG tablet TAKE ONE TABLET BY MOUTH DAILY  . aspirin 81 MG tablet Take 81 mg by mouth every morning.   Marland Kitchen atorvastatin (LIPITOR) 20 MG tablet Take 1 tablet (20 mg total) by mouth daily.  . divalproex (DEPAKOTE ER) 250 MG 24 hr tablet TAKE THREE (3) TABLETS BY MOUTH EVERY NIGHT.  . DULoxetine (CYMBALTA) 60 MG capsule Take 1 capsule (60 mg total) by mouth daily.  Marland Kitchen gabapentin (NEURONTIN) 300 MG capsule TAKE 1 OR 2 CAPSULES BY MOUTH UP TO THREE TIMES DAILY FOR BURNING PAIN IN FEET.  . metoprolol tartrate (LOPRESSOR) 25 MG tablet TAKE ONE TABLET BY MOUTH TWICE DAILY  . mirabegron ER (MYRBETRIQ) 50 MG  TB24 tablet Take 1 tablet (50 mg total) by mouth daily.  Marland Kitchen omeprazole (PRILOSEC) 20 MG capsule TAKE ONE CAPSULE BY MOUTH DAILY.  Marland Kitchen potassium chloride SA (KLOR-CON) 20 MEQ tablet Take 1 tablet (20 mEq total) by mouth daily.  Marland Kitchen torsemide (DEMADEX) 20 MG tablet Take 1 tablet (20 mg total) by mouth as needed.  . [DISCONTINUED] atorvastatin (LIPITOR) 20 MG tablet Take 20 mg by mouth every evening.   . [DISCONTINUED] atorvastatin (LIPITOR) 20 MG tablet TAKE ONE TABLET BY MOUTH DAILY.  . [DISCONTINUED] metoprolol tartrate (LOPRESSOR) 25 MG tablet TAKE ONE TABLET BY MOUTH TWICE DAILY  . [DISCONTINUED] omeprazole (PRILOSEC) 20 MG capsule TAKE ONE CAPSULE BY MOUTH DAILY.   No facility-administered encounter medications on file as of 09/08/2020.    Allergies (verified) Sulfa antibiotics and Iodinated diagnostic agents   History: Past Medical History:  Diagnosis Date  . Arthritis   . Fluttering muscles SEP 2009   MBS/BASW NL  . GERD (gastroesophageal reflux disease)   . Hemorrhoids, internal OCT 2011  . Hereditary and idiopathic peripheral neuropathy 10/04/2016  . Hyperlipidemia   . Hypertension   . IBS (irritable bowel syndrome)   . Prediabetes    Past Surgical History:  Procedure Laterality Date  . COLONOSCOPY  2004   RMR/NUR INFLAMMATORY POLYP, POST POLYPECTOMY BLEED  . COLONOSCOPY  oct 2011 SLF nvd-WEIGHT LOSS   NL TI,  [place]/DC TICS  . ESOPHAGOGASTRODUODENOSCOPY  2006   W/DILATION W/RMR  . KNEE ARTHROSCOPY    . RIGHT FOOT SURGERY    . TOTAL KNEE ARTHROPLASTY Left 05/21/2015   Procedure: TOTAL KNEE ARTHROPLASTY;  Surgeon: Carole Civil, MD;  Location: [facility];  Service: Orthopedics;  Laterality: Left;   Family History  Problem Relation Age of Onset  . Alzheimer's disease Mother   . Dementia Mother   . Cancer Father        stomach  . Alcohol abuse Brother   . Hypertension Other   . Cancer Other   . Colon polyps Neg Hx   . Colon cancer Neg Hx    Social History    Socioeconomic History  . Marital status: Widowed    Spouse name: Not on file  . Number of children: 9  . Years of education: 15  . Highest education level: Not on file  Occupational History  . Occupation: Retired  Tobacco Use  . Smoking status: Never Smoker  . Smokeless tobacco: Current User    Types: Snuff  Vaping Use  . Vaping Use: Never used  Substance and Sexual Activity  . Alcohol use: No  . Drug use: No  . Sexual activity: Never  Other Topics Concern  . Not on file  Social History Narrative   Lives at home alone   Right-handed   Caffeine: no coffee, occasional clear soda   Social Determinants of Health   Financial Resource Strain: Not on file  Food Insecurity: Not on file  Transportation Needs: Not on file  Physical Activity: Not on file  Stress: Not on file  Social Connections: Not on file    Tobacco Counseling Ready to quit: Not Answered Counseling given: Yes   Clinical Intake:  Pre-visit preparation completed: Yes  Pain : 0-10 Pain Score: 7  Pain Type: Chronic pain Pain Location:  (Shoulders, Necks, right knee) Pain Descriptors / Indicators: Aching Pain Onset: More than a month ago Pain Frequency: Intermittent Pain Relieving Factors: OTC topical cream  Pain Relieving Factors: OTC topical cream  BMI - recorded: 30.62 Nutritional Status: BMI > 30  Obese Nutritional Risks: None Diabetes: No  How often do you need to have someone help you when you read instructions, pamphlets, or other written materials from your doctor or pharmacy?: 1 - Never What is the last grade level you completed in school?: 11th grade  Diabetic? No  Interpreter Needed?: No  Information entered by :: Jeralyn Ruths, NP-C   Activities of Daily Living In your present state of health, do you have any difficulty performing the following activities: 09/08/2020  Hearing? N  Vision? N  Difficulty concentrating or making decisions? N  Walking or climbing stairs? N   Dressing or bathing? N  Doing errands, shopping? N  Preparing Food and eating ? N  Using the Toilet? N  In the past six months, have you accidently leaked urine? N  Do you have problems with loss of bowel control? N  Managing your Medications? N  Managing your Finances? N  Housekeeping or managing your Housekeeping? N  Some recent data might be hidden    Patient Care Team: Doree Albee, MD as PCP - General (Internal Medicine) Danie Binder, MD (Inactive) (Gastroenterology) Eloise Harman, DO as Consulting Physician (Internal Medicine)  Indicate any recent Medical Services you may have received from other than [facility] providers in the past year (date may be approximate).     Assessment:  This is a routine wellness examination for Merla.  Hearing/Vision screen No exam data present  Dietary issues and exercise activities discussed: Current Exercise Habits: The patient does not participate in regular exercise at present (will sometimes participate in exercise), Exercise limited by: orthopedic condition(s)  Goals    . DIET - DECREASE SODA OR JUICE INTAKE    . DIET - EAT MORE FRUITS AND VEGETABLES      Depression Screen PHQ 2/9 Scores 09/08/2020 08/31/2019  PHQ - 2 Score 0 0  PHQ- 9 Score 0 -    Fall Risk Fall Risk  09/08/2020 08/31/2019  Falls in the past year? 0 0    FALL RISK PREVENTION PERTAINING TO THE HOME:  Any stairs in or around the home? Yes  If so, are there any without handrails? Yes  Home free of loose throw rugs in walkways, pet beds, electrical cords, etc? Yes  Adequate lighting in your home to reduce risk of falls? Yes   ASSISTIVE DEVICES UTILIZED TO PREVENT FALLS:  Life alert? No  Use of a cane, walker or w/c? Yes  - walker and wheelchair as needed Grab bars in the bathroom? No  Shower chair or bench in shower? Yes  Elevated toilet seat or a handicapped toilet? No   TIMED UP AND GO:  Was the test performed? Yes .  Length of time to  ambulate 10 feet:  17 sec.   Gait slow and steady without use of assistive device  Cognitive Function:     6CIT Screen 09/08/2020 08/31/2019  What Year? 0 points 0 points  What month? 0 points 0 points  What time? 0 points 0 points  Count back from 20 0 points 0 points  Months in reverse 0 points 2 points  Repeat phrase 2 points 4 points  Total Score 2 6    Immunizations Immunization History  Administered Date(s) Administered  . Fluad Quad(high Dose 65+) 07/04/2019, 06/27/2020  . Influenza, High Dose Seasonal PF 07/16/2017, 06/26/2018  . Moderna Sars-Covid-2 Vaccination 11/14/2019, 12/12/2019  . Pneumococcal Conjugate-13 01/31/2018    TDAP status: Due, Education has been provided regarding the importance of this vaccine. Advised may receive this vaccine at local pharmacy or Health Dept. Aware to provide a copy of the vaccination record if obtained from local pharmacy or Health Dept. Verbalized acceptance and understanding.  Flu Vaccine status: Up to date  Pneumococcal vaccine status: Declined,  Education has been provided regarding the importance of this vaccine but patient still declined. Advised may receive this vaccine at local pharmacy or Health Dept. Aware to provide a copy of the vaccination record if obtained from local pharmacy or Health Dept. Verbalized acceptance and understanding.   Covid-19 vaccine status: Completed vaccines  Qualifies for Shingles Vaccine? Yes   Zostavax completed No   Shingrix Completed?: No.    Education has been provided regarding the importance of this vaccine. Patient has been advised to call insurance company to determine out of pocket expense if they have not yet received this vaccine. Advised may also receive vaccine at local pharmacy or Health Dept. Verbalized acceptance and understanding.  Screening Tests Health Maintenance  Topic Date Due  . TETANUS/TDAP  Never done  . PNA vac Low Risk Adult (2 of 2 - PPSV23) 02/01/2019  . COVID-19  Vaccine (3 - Booster for Moderna series) 06/13/2020  . INFLUENZA VACCINE  Completed  . DEXA SCAN  Completed    Health Maintenance  Health Maintenance Due  Topic Date Due  .  TETANUS/TDAP  Never done  . PNA vac Low Risk Adult (2 of 2 - PPSV23) 02/01/2019  . COVID-19 Vaccine (3 - Booster for Moderna series) 06/13/2020    Colorectal cancer screening: No longer required.   Mammogram status: No longer required due to age.  Bone Density status: Completed 2020. Results reflect: Bone density results: OSTEOPENIA. Repeat every 2 years.  Lung Cancer Screening: (Low Dose CT Chest recommended if Age 21-80 years, 30 pack-year currently smoking OR have quit w/in 15years.) does not qualify.   Lung Cancer Screening Referral: N/A  Additional Screening:  Hepatitis C Screening: does qualify; Completed today  Vision Screening: Recommended annual ophthalmology exams for early detection of glaucoma and other disorders of the eye. Is the patient up to date with their annual eye exam?  Yes  Who is the provider or what is the name of the office in which the patient attends annual eye exams?  If pt is not established with a provider, would they like to be referred to a provider to establish care? No .   Dental Screening: Recommended annual dental exams for proper oral hygiene  Community Resource Referral / Chronic Care Management: CRR required this visit?  No   CCM required this visit?  No      Plan:     I have personally reviewed and noted the following in the patient's chart:   . Medical and social history . Use of alcohol, tobacco or illicit drugs  . Current medications and supplements . Functional ability and status . Nutritional status . Physical activity . Advanced directives . List of other physicians . Hospitalizations, surgeries, and ER visits in previous 12 months . Vitals . Screenings to include cognitive, depression, and falls . Referrals and appointments  In addition, I  have reviewed and discussed with patient certain preventive protocols, quality metrics, and best practice recommendations. A written personalized care plan for preventive services as well as general preventive health recommendations were provided to patient.    Did discuss immunizations with this patient today and she would like to hold off on getting PPSV23 as well as tetanus vaccine administered.  I did tell her that if she has any kind of penetrating wound or burn she should notify us at that time which point tetanus shot should be administered.  She tells me she understands.  She would like to be screened for hepatitis C today so we will order this.  She will follow-up in 2 months or sooner as needed with Dr. Anastasio Champion and will have her annual wellness visit scheduled for next year.  Ailene Ards, NP   09/08/2020

## 2020-09-08 NOTE — Telephone Encounter (Signed)
Patient meant to tell you during her visit that she needs a refill of the following medication:  torsemide (DEMADEX) 20 MG tablet This was actually filled today.

## 2020-09-08 NOTE — Telephone Encounter (Signed)
Received a fax from [facility] in [place] requesting a refill of the following medication:  torsemide (DEMADEX) 20 MG tablet  Last filled 08/01/2019, # 30 with 3 refills  Last OV 02/21/2020

## 2020-09-10 LAB — HEPATITIS C ANTIBODY
Hepatitis C Ab: NONREACTIVE
SIGNAL TO CUT-OFF: 0.03 (ref <1.00)

## 2020-09-16 ENCOUNTER — Telehealth (INDEPENDENT_AMBULATORY_CARE_PROVIDER_SITE_OTHER): Payer: Self-pay

## 2020-09-16 NOTE — Telephone Encounter (Signed)
Received a fax from [facility] for a refill request of the following medication:  metoprolol tartrate (LOPRESSOR) 25 MG  Last filled 06/16/2020, #180 with 0 refills  Last OV 02/21/2020  Next OV 11/12/2020

## 2020-09-17 ENCOUNTER — Other Ambulatory Visit (INDEPENDENT_AMBULATORY_CARE_PROVIDER_SITE_OTHER): Payer: Self-pay | Admitting: Internal Medicine

## 2020-09-17 MED ORDER — METOPROLOL TARTRATE 25 MG PO TABS: 25.0000 mg | ORAL_TABLET | Freq: Two times a day (BID) | ORAL | 0 refills | Status: DC

## 2020-09-22 ENCOUNTER — Ambulatory Visit (HOSPITAL_COMMUNITY): Payer: PPO

## 2020-09-24 ENCOUNTER — Other Ambulatory Visit: Payer: Self-pay

## 2020-09-24 ENCOUNTER — Ambulatory Visit (HOSPITAL_COMMUNITY)
Admission: RE | Admit: 2020-09-24 | Discharge: 2020-09-24 | Disposition: A | Discharge status: Home / Self Care | Payer: PPO | Source: Ambulatory Visit | Attending: Internal Medicine | Admitting: Internal Medicine

## 2020-09-24 DIAGNOSIS — Z1231 Encounter for screening mammogram for malignant neoplasm of breast: Secondary | ICD-10-CM | POA: Insufficient documentation

## 2020-10-17 ENCOUNTER — Other Ambulatory Visit (INDEPENDENT_AMBULATORY_CARE_PROVIDER_SITE_OTHER): Payer: Self-pay | Admitting: Internal Medicine

## 2020-10-29 ENCOUNTER — Other Ambulatory Visit (INDEPENDENT_AMBULATORY_CARE_PROVIDER_SITE_OTHER): Payer: Self-pay | Admitting: Internal Medicine

## 2020-11-06 ENCOUNTER — Other Ambulatory Visit (HOSPITAL_COMMUNITY): Payer: Self-pay | Admitting: Internal Medicine

## 2020-11-10 ENCOUNTER — Encounter: Payer: Self-pay | Admitting: Internal Medicine

## 2020-11-12 ENCOUNTER — Other Ambulatory Visit: Payer: Self-pay

## 2020-11-12 ENCOUNTER — Ambulatory Visit (INDEPENDENT_AMBULATORY_CARE_PROVIDER_SITE_OTHER): Payer: PPO | Admitting: Nurse Practitioner

## 2020-11-12 ENCOUNTER — Encounter (INDEPENDENT_AMBULATORY_CARE_PROVIDER_SITE_OTHER): Payer: Self-pay | Admitting: Nurse Practitioner

## 2020-11-12 VITALS — BP 132/78 | HR 63 | Temp 97.0°F | Ht 69.0 in | Wt 203.8 lb

## 2020-11-12 DIAGNOSIS — E782 Mixed hyperlipidemia: Secondary | ICD-10-CM | POA: Diagnosis not present

## 2020-11-12 DIAGNOSIS — R7303 Prediabetes: Secondary | ICD-10-CM | POA: Diagnosis not present

## 2020-11-12 DIAGNOSIS — I10 Essential (primary) hypertension: Secondary | ICD-10-CM

## 2020-11-12 DIAGNOSIS — N1831 Chronic kidney disease, stage 3a: Secondary | ICD-10-CM | POA: Diagnosis not present

## 2020-11-12 DIAGNOSIS — M79601 Pain in right arm: Secondary | ICD-10-CM

## 2020-11-12 NOTE — Progress Notes (Signed)
Subjective:  Patient ID: Katherine Joseph, female    DOB: [birth year]  Age: 86 y.o. MRN: [id number]  CC:  Chief Complaint  Patient presents with  . Follow-up  . Arm Pain    Right arm pain from right shoulder into right elbow, worse when lays down  . Chronic Kidney Disease  . Hypertension  . Prediabetes  . Hyperlipidemia      HPI  This patient arrives today for the above.  Right arm pain: She tells me she has been experiencing right shoulder pain that radiates down her right arm to her elbow as well as sometimes up into her neck.  She has been the pain is intermittent and will sometimes get as severe as a 10/10.  Moving it a lot during the day will sometimes trigger worsening pain at night and it can be especially painful when she lays down on it.  She has been using topical treatment including hemp lotion as well as as needed Tylenol.  The use of these over-the-counter treatments seem to improve her pain moderately.  She denies any traumatic episode that triggered the pain initially.  She does see Dr. Aline Brochure for treatment of her arthritis.  CKD 3A: Creatinine clearance approximately 49, last EGFR 57, due to have CMP rechecked today as well as to have urine checked for albuminuria.  Hypertension: She continues on amlodipine, metoprolol, and torsemide.  Prediabetes: Last A1c was 5.8 and she is due to have this rechecked today.  Hyperlipidemia: She continues on aspirin and atorvastatin, last LDL 81  Past Medical History:  Diagnosis Date  . Arthritis   . Fluttering muscles SEP 2009   MBS/BASW NL  . GERD (gastroesophageal reflux disease)   . Hemorrhoids, internal OCT 2011  . Hereditary and idiopathic peripheral neuropathy 10/04/2016  . Hyperlipidemia   . Hypertension   . IBS (irritable bowel syndrome)   . Prediabetes       Family History  Problem Relation Age of Onset  . Alzheimer's disease Mother   . Dementia Mother   . Cancer Father        stomach  . Alcohol  abuse Brother   . Hypertension Other   . Cancer Other   . Colon polyps Neg Hx   . Colon cancer Neg Hx     Social History   Social History Narrative   Lives at home alone   Right-handed   Caffeine: no coffee, occasional clear soda   Social History   Tobacco Use  . Smoking status: Never Smoker  . Smokeless tobacco: Current User    Types: Snuff  Substance Use Topics  . Alcohol use: No     Current Meds  Medication Sig  . amLODipine (NORVASC) 10 MG tablet TAKE ONE TABLET BY MOUTH DAILY  . aspirin 81 MG tablet Take 81 mg by mouth every morning.   Marland Kitchen atorvastatin (LIPITOR) 20 MG tablet TAKE ONE TABLET BY MOUTH DAILY  . divalproex (DEPAKOTE ER) 250 MG 24 hr tablet TAKE THREE (3) TABLETS BY MOUTH AT NIGHT  . DULoxetine (CYMBALTA) 60 MG capsule Take 1 capsule (60 mg total) by mouth daily.  Marland Kitchen gabapentin (NEURONTIN) 300 MG capsule TAKE 1 OR 2 CAPSULES BY MOUTH UP TO THREE TIMES DAILY FOR BURNING PAIN IN FEET.  . metoprolol tartrate (LOPRESSOR) 25 MG tablet Take 1 tablet (25 mg total) by mouth 2 (two) times daily.  . mirabegron ER (MYRBETRIQ) 50 MG TB24 tablet Take 1 tablet (50 mg total) by  mouth daily.  Marland Kitchen omeprazole (PRILOSEC) 20 MG capsule TAKE ONE CAPSULE BY MOUTH DAILY.  Marland Kitchen potassium chloride SA (KLOR-CON) 20 MEQ tablet TAKE ONE TABLET BY MOUTH DAILY  . torsemide (DEMADEX) 20 MG tablet Take 1 tablet (20 mg total) by mouth as needed.    ROS:  Review of Systems  Constitutional: Negative for diaphoresis, fever and weight loss.  Eyes: Negative for blurred vision.  Respiratory: Negative for shortness of breath.   Cardiovascular: Negative for chest pain.  Gastrointestinal: Negative for abdominal pain, blood in stool and melena.  Neurological: Negative for dizziness, sensory change, weakness and headaches.     Objective:   Today's Vitals: BP 132/78   Pulse 63   Temp (!) 97 F (36.1 C) (Temporal)   Ht _0  (1.753 m)   Wt 203 lb 12.8 oz (92.4 kg)   SpO2 97%   BMI 30.10 kg/m   Vitals with BMI 11/12/2020 09/08/2020 07/23/2020  Height _1  5' 7.5" _2   Weight 203 lbs 13 oz 198 lbs 6 oz 202 lbs 3 oz  BMI 30.08 60.6 30.16  Systolic 010 932 355  Diastolic 78 92 72  Pulse 63 68 67     Physical Exam Vitals reviewed.  Constitutional:      General: She is not in acute distress.    Appearance: Normal appearance.  HENT:     Head: Normocephalic and atraumatic.  Neck:     Vascular: No carotid bruit.  Cardiovascular:     Rate and Rhythm: Normal rate and regular rhythm.     Pulses: Normal pulses.     Heart sounds: Normal heart sounds.  Pulmonary:     Effort: Pulmonary effort is normal.     Breath sounds: Normal breath sounds.  Musculoskeletal:     Right shoulder: Tenderness (with abduction and internal rotation) present. No swelling or bony tenderness. Normal range of motion. Normal strength.     Left shoulder: Normal. No bony tenderness. Normal range of motion. Normal strength.  Skin:    General: Skin is warm and dry.  Neurological:     General: No focal deficit present.     Mental Status: She is alert and oriented to person, place, and time.  Psychiatric:        Mood and Affect: Mood normal.        Behavior: Behavior normal.        Judgment: Judgment normal.          Assessment and Plan   1. Prediabetes   2. Stage 3a chronic kidney disease ([place])   3. Right arm pain   4. Essential hypertension   5. Mixed hyperlipidemia      Plan: 1.  We will check A1c today. 2.  We will continue to monitor closely and will check CMP as well as check urine for microalbuminuria.  May consider referral to nephrology. 3.  Unclear etiology at this time.  No red flag signs or symptoms noted, I encouraged the patient to continue using over-the-counter lotions that she has been using because it seemed to help as well as to use Tylenol as needed.  I recommended if her pain persist for another 7 to 10 days that she call Dr. Aline Brochure to see him for further evaluation.   She tells me she plans on doing this. 4.  Blood pressure acceptable today she will continue on her current medication regimen for her hypertension. 5.  She will continue on her aspirin as well as atorvastatin.  Tests ordered Orders Placed This Encounter  Procedures  . CMP with eGFR([facility])  . Hemoglobin A1c  . Microalbumin/Creatinine Ratio, Urine      No orders of the defined types were placed in this encounter.   Patient to follow-up in 3 months or sooner as needed.  Ailene Ards, NP

## 2020-11-13 LAB — COMPLETE METABOLIC PANEL WITH GFR
AG Ratio: 1.3 (calc) (ref 1.0–2.5)
ALT: 13 U/L (ref 6–29)
AST: 16 U/L (ref 10–35)
Albumin: 3.8 g/dL (ref 3.6–5.1)
Alkaline phosphatase (APISO): 68 U/L (ref 37–153)
BUN/Creatinine Ratio: 14 (calc) (ref 6–22)
BUN: 14 mg/dL (ref 7–25)
CO2: 30 mmol/L (ref 20–32)
Calcium: 9.2 mg/dL (ref 8.6–10.4)
Chloride: 104 mmol/L (ref 98–110)
Creat: 1 mg/dL — ABNORMAL HIGH (ref 0.60–0.88)
GFR, Est African American: 60 mL/min/{1.73_m2} (ref >=60)
GFR, Est Non African American: 52 mL/min/{1.73_m2} — ABNORMAL LOW (ref >=60)
Globulin: 3 g/dL (calc) (ref 1.9–3.7)
Glucose, Bld: 90 mg/dL (ref 65–139)
Potassium: 4.5 mmol/L (ref 3.5–5.3)
Sodium: 140 mmol/L (ref 135–146)
Total Bilirubin: 0.3 mg/dL (ref 0.2–1.2)
Total Protein: 6.8 g/dL (ref 6.1–8.1)

## 2020-11-13 LAB — HEMOGLOBIN A1C
Hgb A1c MFr Bld: 6.2 % of total Hgb — ABNORMAL HIGH (ref <5.7)
Mean Plasma Glucose: 131 mg/dL
eAG (mmol/L): 7.3 mmol/L

## 2020-11-13 LAB — MICROALBUMIN / CREATININE URINE RATIO
Creatinine, Urine: 44 mg/dL (ref 20–275)
Microalb Creat Ratio: 14 mcg/mg creat (ref <30)
Microalb, Ur: 0.6 mg/dL

## 2020-11-19 ENCOUNTER — Other Ambulatory Visit (INDEPENDENT_AMBULATORY_CARE_PROVIDER_SITE_OTHER): Payer: Self-pay | Admitting: Internal Medicine

## 2020-12-09 ENCOUNTER — Other Ambulatory Visit (INDEPENDENT_AMBULATORY_CARE_PROVIDER_SITE_OTHER): Payer: Self-pay | Admitting: Internal Medicine

## 2020-12-29 ENCOUNTER — Ambulatory Visit: Payer: PPO | Admitting: Urology

## 2020-12-30 ENCOUNTER — Telehealth (INDEPENDENT_AMBULATORY_CARE_PROVIDER_SITE_OTHER): Payer: Self-pay

## 2020-12-30 NOTE — Telephone Encounter (Signed)
Patient called to ask what her lab results were from February 2022. I verified patient DOB and asked if she used her MyChart and patient wanted to know what that is. I let patient know that it looks like she had an active MyChart message and patient became very upset and stated that she had never set this up because she did not have an email.  Gave patient her lab results and her next appointment times. Patient verbalized an understanding. Sending as Katherine Joseph since patient does not use her MyChart.

## 2021-01-09 ENCOUNTER — Encounter: Payer: Self-pay | Admitting: Urology

## 2021-01-09 ENCOUNTER — Other Ambulatory Visit: Payer: Self-pay

## 2021-01-09 ENCOUNTER — Ambulatory Visit (INDEPENDENT_AMBULATORY_CARE_PROVIDER_SITE_OTHER): Payer: PPO | Admitting: Urology

## 2021-01-09 VITALS — BP 151/79 | HR 62 | Temp 97.0°F | Ht 69.0 in | Wt 197.0 lb

## 2021-01-09 DIAGNOSIS — R32 Unspecified urinary incontinence: Secondary | ICD-10-CM | POA: Diagnosis not present

## 2021-01-09 LAB — URINALYSIS, ROUTINE W REFLEX MICROSCOPIC
Bilirubin, UA: NEGATIVE
Glucose, UA: NEGATIVE
Leukocytes,UA: NEGATIVE
Nitrite, UA: NEGATIVE
RBC, UA: NEGATIVE
Specific Gravity, UA: 1.02 (ref 1.005–1.030)
Urobilinogen, Ur: 0.2 mg/dL (ref 0.2–1.0)
pH, UA: 7 (ref 5.0–7.5)

## 2021-01-09 LAB — BLADDER SCAN AMB NON-IMAGING: Scan Result: 53

## 2021-01-09 MED ORDER — GEMTESA 75 MG PO TABS: 1.0000 | ORAL_TABLET | Freq: Every day | ORAL | 0 refills | Status: DC

## 2021-01-09 MED ORDER — MIRABEGRON ER 50 MG PO TB24: 50.0000 mg | ORAL_TABLET | Freq: Every day | ORAL | 11 refills | Status: DC

## 2021-01-09 NOTE — Progress Notes (Signed)
Urological Symptom Review  Patient is experiencing the following symptoms: Frequent urination Hard to postpone urination   Review of Systems  Gastrointestinal (upper)  : Negative for upper GI symptoms  Gastrointestinal (lower) : Negative for lower GI symptoms  Constitutional : Negative for symptoms  Skin: Negative for skin symptoms  Eyes: Negative for eye symptoms  Ear/Nose/Throat : Negative for Ear/Nose/Throat symptoms  Hematologic/Lymphatic: Negative for Hematologic/Lymphatic symptoms  Cardiovascular : Leg swelling  Respiratory : Negative for respiratory symptoms  Endocrine: Negative for endocrine symptoms  Musculoskeletal: Back pain Joint pain  Neurological: Negative for neurological symptoms  Psychologic: Negative for psychiatric symptoms

## 2021-01-09 NOTE — Progress Notes (Signed)
01/09/2021 2:13 PM   Katherine Joseph [birth year] [id number]  Referring provider: Doree Albee, MD [address] [place],  [place] [address]  followup urinary incontinence  HPI: Katherine Joseph is a 86yo here for followup for urinary incontinence. She is on mirabegron 50mg  daily. She has incontinent episodes at night.  Since last visit she is more bothered by her incontinence at night. No daytime incontinence. No daytime urinary urgency or frequency.    PMH: Past Medical History:  Diagnosis Date  . Arthritis   . Fluttering muscles SEP 2009   MBS/BASW NL  . GERD (gastroesophageal reflux disease)   . Hemorrhoids, internal OCT 2011  . Hereditary and idiopathic peripheral neuropathy 10/04/2016  . Hyperlipidemia   . Hypertension   . IBS (irritable bowel syndrome)   . Prediabetes     Surgical History: Past Surgical History:  Procedure Laterality Date  . COLONOSCOPY  2004   RMR/NUR INFLAMMATORY POLYP, POST POLYPECTOMY BLEED  . COLONOSCOPY  oct 2011 SLF nvd-WEIGHT LOSS   NL TI, [place]/DC TICS  . ESOPHAGOGASTRODUODENOSCOPY  2006   W/DILATION W/RMR  . KNEE ARTHROSCOPY    . RIGHT FOOT SURGERY    . TOTAL KNEE ARTHROPLASTY Left 05/21/2015   Procedure: TOTAL KNEE ARTHROPLASTY;  Surgeon: Carole Civil, MD;  Location: [facility];  Service: Orthopedics;  Laterality: Left;    Home Medications:  Allergies as of 01/09/2021      Reactions   Sulfa Antibiotics Hives   Iodinated Diagnostic Agents Rash      Medication List       Accurate as of January 09, 2021  2:13 PM. If you have any questions, ask your nurse or doctor.        amLODipine 10 MG tablet Commonly known as: NORVASC TAKE ONE TABLET BY MOUTH DAILY   aspirin 81 MG tablet Take 81 mg by mouth every morning.   atorvastatin 20 MG tablet Commonly known as: LIPITOR TAKE ONE TABLET BY MOUTH DAILY   divalproex 250 MG 24 hr tablet Commonly known as: DEPAKOTE ER TAKE THREE (3) TABLETS BY MOUTH AT NIGHT   DULoxetine 60 MG  capsule Commonly known as: CYMBALTA TAKE ONE CAPSULE BY MOUTH DAILY   gabapentin 300 MG capsule Commonly known as: NEURONTIN TAKE 1 OR 2 CAPSULES BY MOUTH UP TO THREE TIMES DAILY FOR BURNING PAIN IN FEET.   metoprolol tartrate 25 MG tablet Commonly known as: LOPRESSOR TAKE ONE TABLET BY MOUTH TWICE DAILY   mirabegron ER 50 MG Tb24 tablet Commonly known as: MYRBETRIQ Take 1 tablet (50 mg total) by mouth daily.   omeprazole 20 MG capsule Commonly known as: PRILOSEC TAKE ONE CAPSULE BY MOUTH DAILY.   potassium chloride SA 20 MEQ tablet Commonly known as: KLOR-CON TAKE ONE TABLET BY MOUTH DAILY   torsemide 20 MG tablet Commonly known as: DEMADEX Take 1 tablet (20 mg total) by mouth as needed.       Allergies:  Allergies  Allergen Reactions  . Sulfa Antibiotics Hives  . Iodinated Diagnostic Agents Rash    Family History: Family History  Problem Relation Age of Onset  . Alzheimer's disease Mother   . Dementia Mother   . Cancer Father        stomach  . Alcohol abuse Brother   . Hypertension Other   . Cancer Other   . Colon polyps Neg Hx   . Colon cancer Neg Hx     Social History:  reports that she has never smoked.  Her smokeless tobacco use includes snuff. She reports that she does not drink alcohol and does not use drugs.  ROS: All other review of systems were reviewed and are negative except what is noted above in HPI  Physical Exam: BP (!) 151/79   Pulse 62   Temp (!) 97 F (36.1 C)   Ht 5\' 9"  (1.753 m)   Wt 197 lb (89.4 kg)   BMI 29.09 kg/m   Constitutional:  Alert and oriented, No acute distress. HEENT:  AT, moist mucus membranes.  Trachea midline, no masses. Cardiovascular: No clubbing, cyanosis, or edema. Respiratory: Normal respiratory effort, no increased work of breathing. GI: Abdomen is soft, nontender, nondistended, no abdominal masses GU: No CVA tenderness.  Lymph: No cervical or inguinal lymphadenopathy. Skin: No rashes, bruises or  suspicious lesions. Neurologic: Grossly intact, no focal deficits, moving all 4 extremities. Psychiatric: Normal mood and affect.  Laboratory Data: Lab Results  Component Value Date   WBC 8.5 01/24/2020   HGB 12.2 01/24/2020   HCT 39.8 01/24/2020   MCV 77.3 (L) 01/24/2020   PLT 202 01/24/2020    Lab Results  Component Value Date   CREATININE 1.00 (H) 11/12/2020    No results found for: PSA  No results found for: TESTOSTERONE  Lab Results  Component Value Date   HGBA1C 6.2 (H) 11/12/2020    Urinalysis    Component Value Date/Time   COLORURINE DARK YELLOW 02/21/2020 1136   APPEARANCEUR Clear 06/30/2020 1439   LABSPEC 1.032 02/21/2020 1136   PHURINE 6.0 02/21/2020 1136   GLUCOSEU Negative 06/30/2020 1439   HGBUR NEGATIVE 02/21/2020 1136   BILIRUBINUR Negative 06/30/2020 1439   KETONESUR TRACE (A) 02/21/2020 1136   PROTEINUR Negative 06/30/2020 1439   PROTEINUR TRACE (A) 02/21/2020 1136   NITRITE Negative 06/30/2020 1439   NITRITE NEGATIVE 05/14/2016 1945   LEUKOCYTESUR Negative 06/30/2020 1439    Lab Results  Component Value Date   LABMICR Comment 06/30/2020   WBCUA >30 (A) 04/14/2020   LABEPIT 0-10 04/14/2020   BACTERIA Few (A) 04/14/2020    Pertinent Imaging:  Results for orders placed during the hospital encounter of 09/24/08  DG Abd 1 View  Narrative Clinical Data: Left renal calculus, follow-up  ABDOMEN - 1 VIEW  Comparison: 03/27/2008  Findings: Diffuse bony demineralization. Levoconvex scoliosis lumbar spine. No definite left renal calculus identified. Small pelvic phleboliths stable. Slight prominent stool in proximal half of colon. Bowel gas pattern otherwise normal. No definite urinary tract calcification identified.  IMPRESSION: No definite urinary tract calcification seen.  Provider: Briscoe Burns  No results found for this or any previous visit.  No results found for this or any previous visit.  No results found for this  or any previous visit.  No results found for this or any previous visit.  No results found for this or any previous visit.  No results found for this or any previous visit.  No results found for this or any previous visit.   Assessment & Plan:    1. Urinary incontinence, unspecified type -We will try gemtesa 75mg  daily.  - Urinalysis, Routine w reflex microscopic - Bladder Scan (Post Void Residual) in office   No follow-ups on file.  Nicolette Bang, MD  [facility] [place]

## 2021-01-09 NOTE — Patient Instructions (Signed)

## 2021-01-27 ENCOUNTER — Other Ambulatory Visit (INDEPENDENT_AMBULATORY_CARE_PROVIDER_SITE_OTHER): Payer: Self-pay | Admitting: Internal Medicine

## 2021-02-09 ENCOUNTER — Other Ambulatory Visit: Payer: Self-pay

## 2021-02-09 ENCOUNTER — Ambulatory Visit (INDEPENDENT_AMBULATORY_CARE_PROVIDER_SITE_OTHER): Payer: PPO | Admitting: Internal Medicine

## 2021-02-09 ENCOUNTER — Encounter (INDEPENDENT_AMBULATORY_CARE_PROVIDER_SITE_OTHER): Payer: Self-pay | Admitting: Internal Medicine

## 2021-02-09 VITALS — BP 130/72 | HR 87 | Temp 97.0°F | Resp 18 | Ht 69.0 in | Wt 208.0 lb

## 2021-02-09 DIAGNOSIS — I10 Essential (primary) hypertension: Secondary | ICD-10-CM

## 2021-02-09 DIAGNOSIS — N1831 Chronic kidney disease, stage 3a: Secondary | ICD-10-CM

## 2021-02-09 DIAGNOSIS — R7303 Prediabetes: Secondary | ICD-10-CM

## 2021-02-09 DIAGNOSIS — E782 Mixed hyperlipidemia: Secondary | ICD-10-CM

## 2021-02-09 MED ORDER — AMLODIPINE BESYLATE 10 MG PO TABS: 1.0000 | ORAL_TABLET | Freq: Every day | ORAL | 1 refills | Status: AC

## 2021-02-09 MED ORDER — ATORVASTATIN CALCIUM 20 MG PO TABS: 1.0000 | ORAL_TABLET | Freq: Every day | ORAL | 1 refills | Status: AC

## 2021-02-09 MED ORDER — OMEPRAZOLE 20 MG PO CPDR: 1.0000 | DELAYED_RELEASE_CAPSULE | Freq: Every day | ORAL | 1 refills | Status: AC

## 2021-02-09 MED ORDER — METOPROLOL TARTRATE 25 MG PO TABS: 25.0000 mg | ORAL_TABLET | Freq: Two times a day (BID) | ORAL | 1 refills | Status: DC

## 2021-02-09 NOTE — Progress Notes (Signed)
Please let her know to keep stocking on to help get swelling down BLE

## 2021-02-09 NOTE — Progress Notes (Signed)
Metrics: Intervention Frequency ACO  Documented Smoking Status Yearly  Screened one or more times in 24 months  Cessation Counseling or  Active cessation medication Past 24 months  Past 24 months   Guideline developer: UpToDate (See UpToDate for funding source) Date Released: 2014       Wellness Office Visit  Subjective:  Patient ID: Katherine Joseph, female    DOB: 09-22-1934  Age: 86 y.o. MRN: [id number]  CC: This lady comes in for follow-up of prediabetes, chronic kidney disease, hypertension, hyperlipidemia. HPI  She is doing reasonably well and has no specific complaints except from some swelling in her abdomen which she notices is worse throughout the day and not so much in the morning.  This is also in keeping with bilateral lower leg swelling.  She wears stockings on her legs and also elevates her legs throughout the day. She takes medications for hypertension, hyperlipidemia and requires refills. She also has gastroesophageal reflux disease and requires refill of omeprazole. She is prediabetic and her last hemoglobin A1c was increasing to 6.2% Past Medical History:  Diagnosis Date  . Arthritis   . Fluttering muscles SEP 2009   MBS/BASW NL  . GERD (gastroesophageal reflux disease)   . Hemorrhoids, internal OCT 2011  . Hereditary and idiopathic peripheral neuropathy 10/04/2016  . Hyperlipidemia   . Hypertension   . IBS (irritable bowel syndrome)   . Prediabetes    Past Surgical History:  Procedure Laterality Date  . COLONOSCOPY  2004   RMR/NUR INFLAMMATORY POLYP, POST POLYPECTOMY BLEED  . COLONOSCOPY  oct 2011 SLF nvd-WEIGHT LOSS   NL TI, [place]/DC TICS  . ESOPHAGOGASTRODUODENOSCOPY  2006   W/DILATION W/RMR  . KNEE ARTHROSCOPY    . RIGHT FOOT SURGERY    . TOTAL KNEE ARTHROPLASTY Left 05/21/2015   Procedure: TOTAL KNEE ARTHROPLASTY;  Surgeon: Carole Civil, MD;  Location: [facility];  Service: Orthopedics;  Laterality: Left;     Family History  Problem Relation Age  of Onset  . Alzheimer's disease Mother   . Dementia Mother   . Cancer Father        stomach  . Alcohol abuse Brother   . Hypertension Other   . Cancer Other   . Colon polyps Neg Hx   . Colon cancer Neg Hx     Social History   Social History Narrative   Lives at home alone   Right-handed   Caffeine: no coffee, occasional clear soda   Social History   Tobacco Use  . Smoking status: Never Smoker  . Smokeless tobacco: Current User    Types: Snuff  Substance Use Topics  . Alcohol use: No    Current Meds  Medication Sig  . aspirin 81 MG tablet Take 81 mg by mouth every morning.   . divalproex (DEPAKOTE ER) 250 MG 24 hr tablet TAKE THREE (3) TABLETS BY MOUTH AT NIGHT  . DULoxetine (CYMBALTA) 60 MG capsule TAKE ONE CAPSULE BY MOUTH DAILY  . gabapentin (NEURONTIN) 300 MG capsule TAKE 1 OR 2 CAPSULES BY MOUTH UP TO THREE TIMES DAILY FOR BURNING PAIN IN FEET.  . mirabegron ER (MYRBETRIQ) 50 MG TB24 tablet Take 1 tablet (50 mg total) by mouth daily.  . potassium chloride SA (KLOR-CON) 20 MEQ tablet TAKE ONE TABLET BY MOUTH DAILY  . torsemide (DEMADEX) 20 MG tablet Take 1 tablet (20 mg total) by mouth as needed.  . Vibegron (GEMTESA) 75 MG TABS Take 1 capsule by mouth at bedtime.  . [  DISCONTINUED] amLODipine (NORVASC) 10 MG tablet TAKE ONE TABLET BY MOUTH DAILY  . [DISCONTINUED] atorvastatin (LIPITOR) 20 MG tablet TAKE ONE TABLET BY MOUTH DAILY  . [DISCONTINUED] metoprolol tartrate (LOPRESSOR) 25 MG tablet TAKE ONE TABLET BY MOUTH TWICE DAILY  . [DISCONTINUED] omeprazole (PRILOSEC) 20 MG capsule TAKE ONE CAPSULE BY MOUTH DAILY.     [place] Office Visit from 11/12/2020 in [facility]  PHQ-9 Total Score 0      Objective:   Today's Vitals: BP 130/72 (BP Location: Right Arm, Patient Position: Sitting, Cuff Size: Normal)   Pulse 87   Temp (!) 97 F (36.1 C) (Temporal)   Resp 18   Ht 5\' 9"  (1.753 m)   Wt 208 lb (94.3 kg)   SpO2 99%   BMI 30.72 kg/m  Vitals  with BMI 02/09/2021 01/09/2021 11/12/2020  Height 5\' 9"  5\' 9"  5\' 9"   Weight 208 lbs 197 lbs 203 lbs 13 oz  BMI 30.7 41.32 44.01  Systolic 027 253 664  Diastolic 72 79 78  Pulse 87 62 63     Physical Exam   Systemically well.  She has gained 11 pounds since last visit.  Blood pressure is in a good range.  She does have bilateral pitting lower leg edema consistent with venous insufficiency.  She has no clinical features of heart failure.   Assessment   1. Prediabetes   2. Stage 3a chronic kidney disease ([place])   3. Essential hypertension   4. Mixed hyperlipidemia       Tests ordered Orders Placed This Encounter  Procedures  . COMPLETE METABOLIC PANEL WITH GFR  . Hemoglobin A1c     Plan: 1. Continue with antihypertensive medication as before.  Check renal function. 2. Continue with statin therapy. 3. Further recommendations will depend on blood results and she will follow-up with Judson Roch as previously scheduled in December.  Pneumococcal 23 vaccination was given today.   Meds ordered this encounter  Medications  . amLODipine (NORVASC) 10 MG tablet    Sig: Take 1 tablet (10 mg total) by mouth daily.    Dispense:  90 tablet    Refill:  1    This prescription was filled on 08/29/2020. Any refills authorized will be placed on file.  Marland Kitchen atorvastatin (LIPITOR) 20 MG tablet    Sig: Take 1 tablet (20 mg total) by mouth daily.    Dispense:  90 tablet    Refill:  1  . metoprolol tartrate (LOPRESSOR) 25 MG tablet    Sig: Take 1 tablet (25 mg total) by mouth 2 (two) times daily.    Dispense:  180 tablet    Refill:  1  . omeprazole (PRILOSEC) 20 MG capsule    Sig: Take 1 capsule (20 mg total) by mouth daily.    Dispense:  90 capsule    Refill:  1    This prescription was filled on 11/06/2020. Any refills authorized will be placed on file.    Doree Albee, MD

## 2021-02-10 LAB — COMPLETE METABOLIC PANEL WITH GFR
AG Ratio: 1.4 (calc) (ref 1.0–2.5)
ALT: 14 U/L (ref 6–29)
AST: 18 U/L (ref 10–35)
Albumin: 3.9 g/dL (ref 3.6–5.1)
Alkaline phosphatase (APISO): 66 U/L (ref 37–153)
BUN/Creatinine Ratio: 18 (calc) (ref 6–22)
BUN: 19 mg/dL (ref 7–25)
CO2: 30 mmol/L (ref 20–32)
Calcium: 9.3 mg/dL (ref 8.6–10.4)
Chloride: 103 mmol/L (ref 98–110)
Creat: 1.04 mg/dL — ABNORMAL HIGH (ref 0.60–0.88)
GFR, Est African American: 58 mL/min/{1.73_m2} — ABNORMAL LOW (ref >=60)
GFR, Est Non African American: 50 mL/min/{1.73_m2} — ABNORMAL LOW (ref >=60)
Globulin: 2.8 g/dL (calc) (ref 1.9–3.7)
Glucose, Bld: 99 mg/dL (ref 65–139)
Potassium: 4.3 mmol/L (ref 3.5–5.3)
Sodium: 139 mmol/L (ref 135–146)
Total Bilirubin: 0.3 mg/dL (ref 0.2–1.2)
Total Protein: 6.7 g/dL (ref 6.1–8.1)

## 2021-02-10 LAB — HEMOGLOBIN A1C
Hgb A1c MFr Bld: 6 % of total Hgb — ABNORMAL HIGH (ref <5.7)
Mean Plasma Glucose: 126 mg/dL
eAG (mmol/L): 7 mmol/L

## 2021-02-20 ENCOUNTER — Other Ambulatory Visit (INDEPENDENT_AMBULATORY_CARE_PROVIDER_SITE_OTHER): Payer: Self-pay | Admitting: Nurse Practitioner

## 2021-02-27 ENCOUNTER — Other Ambulatory Visit (INDEPENDENT_AMBULATORY_CARE_PROVIDER_SITE_OTHER): Payer: Self-pay | Admitting: Internal Medicine

## 2021-03-26 ENCOUNTER — Ambulatory Visit: Payer: PPO | Admitting: Internal Medicine

## 2021-04-06 ENCOUNTER — Ambulatory Visit (INDEPENDENT_AMBULATORY_CARE_PROVIDER_SITE_OTHER): Payer: PPO | Admitting: Gastroenterology

## 2021-04-06 ENCOUNTER — Other Ambulatory Visit: Payer: Self-pay

## 2021-04-06 ENCOUNTER — Telehealth: Payer: Self-pay | Admitting: *Deleted

## 2021-04-06 ENCOUNTER — Encounter: Payer: Self-pay | Admitting: Gastroenterology

## 2021-04-06 VITALS — BP 139/68 | HR 69 | Temp 97.0°F | Ht 69.0 in | Wt 208.8 lb

## 2021-04-06 DIAGNOSIS — K7689 Other specified diseases of liver: Secondary | ICD-10-CM

## 2021-04-06 DIAGNOSIS — K219 Gastro-esophageal reflux disease without esophagitis: Secondary | ICD-10-CM

## 2021-04-06 DIAGNOSIS — K5900 Constipation, unspecified: Secondary | ICD-10-CM

## 2021-04-06 DIAGNOSIS — N83201 Unspecified ovarian cyst, right side: Secondary | ICD-10-CM | POA: Diagnosis not present

## 2021-04-06 DIAGNOSIS — R140 Abdominal distension (gaseous): Secondary | ICD-10-CM | POA: Diagnosis not present

## 2021-04-06 NOTE — Progress Notes (Signed)
Primary Care Physician: Doree Albee, MD  Primary Gastroenterologist:  Elon Alas. Abbey Chatters, DO   Chief Complaint  Patient presents with   Bloated     HPI: Katherine Joseph is a 86 y.o. female here for follow up. Last seen 07/2020. H/O chronic reflux/dyspepsia, constipation.   At last office visit, patient discussed with Dr. Abbey Chatters regarding 10 year screening colonoscopy. She decided to forgo further colonoscopy for colon cancer screening.   Weight is up 6 pounds since 07/2020. She does not believe she has gained weight. She thinks it is fluid. Has some lower extremity edema. She is concerned about bloating. States she has history of liver and right ovarian cyst. No abdominal pain but feels her stomach is full/distended. Can't wear her regular clothes. Size has went up. BM regular. No melena, brbpr. No heartburn, dysphagia, vomiting. No abdominal pain.   CT A/P without contrast 2018 showed multiple liver cysts, 4.8X3.2cm right ovarian cysts, mildly enlarged uterus.  Current Outpatient Medications  Medication Sig Dispense Refill   amLODipine (NORVASC) 10 MG tablet Take 1 tablet (10 mg total) by mouth daily. 90 tablet 1   aspirin 81 MG tablet Take 81 mg by mouth every morning.      atorvastatin (LIPITOR) 20 MG tablet Take 1 tablet (20 mg total) by mouth daily. 90 tablet 1   divalproex (DEPAKOTE ER) 250 MG 24 hr tablet TAKE THREE (3) TABLETS BY MOUTH AT NIGHT 90 tablet 3   DULoxetine (CYMBALTA) 60 MG capsule TAKE ONE CAPSULE BY MOUTH DAILY 30 capsule 2   gabapentin (NEURONTIN) 300 MG capsule TAKE 1 OR 2 CAPSULES BY MOUTH UP TO THREE TIMES DAILY FOR BURNING PAIN IN FEET. 180 capsule 3   metoprolol tartrate (LOPRESSOR) 25 MG tablet Take 1 tablet (25 mg total) by mouth 2 (two) times daily. 180 tablet 1   mirabegron ER (MYRBETRIQ) 50 MG TB24 tablet Take 1 tablet (50 mg total) by mouth daily. 30 tablet 11   omeprazole (PRILOSEC) 20 MG capsule Take 1 capsule (20 mg total) by mouth  daily. 90 capsule 1   potassium chloride SA (KLOR-CON) 20 MEQ tablet TAKE ONE TABLET BY MOUTH DAILY 30 tablet 3   torsemide (DEMADEX) 20 MG tablet Take 1 tablet (20 mg total) by mouth as needed. 30 tablet 3   Vibegron (GEMTESA) 75 MG TABS Take 1 capsule by mouth at bedtime. 30 tablet 0   No current facility-administered medications for this visit.    Allergies as of 04/06/2021 - Review Complete 04/06/2021  Allergen Reaction Noted   Sulfa antibiotics Hives 08/29/2014   Iodinated diagnostic agents Rash 01/13/2011    ROS:  General: Negative for anorexia, weight loss, fever, chills, fatigue, weakness. ENT: Negative for hoarseness, difficulty swallowing , nasal congestion. CV: Negative for chest pain, angina, palpitations, dyspnea on exertion, peripheral edema.  Respiratory: Negative for dyspnea at rest, dyspnea on exertion, cough, sputum, wheezing.  GI: See history of present illness. GU:  Negative for dysuria, hematuria, urinary incontinence, urinary frequency, nocturnal urination.  Endo: Negative for unusual weight change.    Physical Examination:   BP 139/68   Pulse 69   Temp (!) 97 F (36.1 C)   Ht 5\' 9"  (1.753 m)   Wt 208 lb 12.8 oz (94.7 kg)   BMI 30.83 kg/m   General: Well-nourished, well-developed in no acute distress.  Eyes: No icterus. Mouth: masked Lungs: Clear to auscultation bilaterally.  Heart: Regular rate and rhythm, no murmurs rubs or  gallops.  Abdomen: Bowel sounds are normal,   nondistended, no hepatosplenomegaly or masses, no abdominal bruits or hernia , no rebound or guarding.  Right liver edge easily palpated. No obvious fluid. Mild tenderness in epig region. Extremities: No lower extremity edema. No clubbing or deformities. Neuro: Alert and oriented x 4   Skin: Warm and dry, no jaundice.   Psych: Alert and cooperative, normal mood and affect.  Labs:  Lab Results  Component Value Date   HGBA1C 6.0 (H) 02/09/2021   Lab Results  Component Value Date    CREATININE 1.04 (H) 02/09/2021   BUN 19 02/09/2021   NA 139 02/09/2021   K 4.3 02/09/2021   CL 103 02/09/2021   CO2 30 02/09/2021   Lab Results  Component Value Date   ALT 14 02/09/2021   AST 18 02/09/2021   ALKPHOS 62 06/02/2010   BILITOT 0.3 02/09/2021     Imaging Studies: No results found.   Assessment:  86 y/o female with history of liver cysts, right ovarian cyst, GERD, constipation presenting for follow up.   GERD: well controlled on omeprazole.   Constipation: doing well at this time.   Bloating: complains of feeling bloated. H/o right ovarian cyst and liver cyst on prior CT. Liver edge easily palpated today on exam. I suspect symptoms multifactorial due to dyspepsia, some weight gain but need to consider intraabdominal process as well.   Plan: Continue omeprazole 20mg  daily before breakfast.  Abdominal/pelvic u/s

## 2021-04-06 NOTE — Patient Instructions (Signed)
Continue omeprazole 20mg  daily for acid reflux. Abdominal/pelvic ultrasounds as scheduled.

## 2021-04-06 NOTE — Telephone Encounter (Signed)
Abd/pelvis US scheduled for 7/25 at 9:30am, arrival 9:!5am, npo midnight.  Called pt and is aware of appt details. She voiced understanding

## 2021-04-13 ENCOUNTER — Ambulatory Visit (HOSPITAL_COMMUNITY)
Admission: RE | Admit: 2021-04-13 | Discharge: 2021-04-13 | Disposition: A | Discharge status: Home / Self Care | Payer: PPO | Source: Ambulatory Visit | Attending: Gastroenterology | Admitting: Gastroenterology

## 2021-04-13 ENCOUNTER — Other Ambulatory Visit: Payer: Self-pay

## 2021-04-13 DIAGNOSIS — K7689 Other specified diseases of liver: Secondary | ICD-10-CM | POA: Diagnosis not present

## 2021-04-13 DIAGNOSIS — N83291 Other ovarian cyst, right side: Secondary | ICD-10-CM | POA: Diagnosis not present

## 2021-04-13 DIAGNOSIS — R140 Abdominal distension (gaseous): Secondary | ICD-10-CM

## 2021-04-13 DIAGNOSIS — K5900 Constipation, unspecified: Secondary | ICD-10-CM | POA: Diagnosis not present

## 2021-04-13 DIAGNOSIS — N83201 Unspecified ovarian cyst, right side: Secondary | ICD-10-CM | POA: Diagnosis not present

## 2021-04-13 DIAGNOSIS — R9389 Abnormal findings on diagnostic imaging of other specified body structures: Secondary | ICD-10-CM | POA: Diagnosis not present

## 2021-04-13 DIAGNOSIS — K219 Gastro-esophageal reflux disease without esophagitis: Secondary | ICD-10-CM

## 2021-05-13 NOTE — Addendum Note (Signed)
Encounter addended by: Annie Paras on: 05/13/2021 1:15 PM  Actions taken: Letter saved

## 2021-05-20 ENCOUNTER — Telehealth: Payer: Self-pay

## 2021-05-20 NOTE — Telephone Encounter (Signed)
Pt. States she received a letter in regard to her ultrasound she had. The letter tells her to call her PCP who has died. States she did receive her results from the ultrasound from her GI doctor.

## 2021-06-15 ENCOUNTER — Encounter: Payer: Self-pay | Admitting: Orthopedic Surgery

## 2021-06-15 ENCOUNTER — Ambulatory Visit: Payer: PPO | Admitting: Orthopedic Surgery

## 2021-06-15 ENCOUNTER — Ambulatory Visit: Payer: PPO

## 2021-06-15 ENCOUNTER — Other Ambulatory Visit: Payer: Self-pay

## 2021-06-15 DIAGNOSIS — M1710 Unilateral primary osteoarthritis, unspecified knee: Secondary | ICD-10-CM

## 2021-06-15 DIAGNOSIS — Z96652 Presence of left artificial knee joint: Secondary | ICD-10-CM | POA: Diagnosis not present

## 2021-06-15 DIAGNOSIS — M170 Bilateral primary osteoarthritis of knee: Secondary | ICD-10-CM | POA: Diagnosis not present

## 2021-06-15 DIAGNOSIS — H353133 Nonexudative age-related macular degeneration, bilateral, advanced atrophic without subfoveal involvement: Secondary | ICD-10-CM | POA: Diagnosis not present

## 2021-06-15 NOTE — Progress Notes (Signed)
Chief Complaint  Patient presents with   Knee Pain    Bilateral knees entire body painful/ left knee replaced 05/21/15     Encounter Diagnoses  Name Primary?   Arthritis of knee Yes   Status post total left knee replacement 05/21/15    Katherine Joseph is 77 she has had a left total knee replacement 6 years ago she has osteoarthritis of the right knee  She is doing well.  She does complain of intermittent pain in the right knee but would not like to have any other surgery she does topical rubs which seem to help her  She does complain of some numbness in the right leg.  She is on gabapentin 300 mg 1 to 2 tablets in the morning and evening.  She had been on 3 times a day dosing but cut it back.  She is not really sure why  X-rays of her knee replacement on the left show stable knee with no signs of loosening  She has osteoarthritis of the right knee which does not seem to be progressing seems to be involve the lateral compartment primarily  We discussed possible treatment options she would not like any surgical interventions at this time I told her to increase her gabapentin back to the 3 times a day dosing  Follow-up in a year to x-ray the left total knee

## 2021-07-08 DIAGNOSIS — R202 Paresthesia of skin: Secondary | ICD-10-CM | POA: Diagnosis not present

## 2021-07-08 DIAGNOSIS — G629 Polyneuropathy, unspecified: Secondary | ICD-10-CM | POA: Diagnosis not present

## 2021-07-08 DIAGNOSIS — R600 Localized edema: Secondary | ICD-10-CM | POA: Diagnosis not present

## 2021-07-08 DIAGNOSIS — I10 Essential (primary) hypertension: Secondary | ICD-10-CM | POA: Diagnosis not present

## 2021-07-13 ENCOUNTER — Telehealth: Payer: Self-pay

## 2021-07-13 NOTE — Telephone Encounter (Signed)
Pt called office, she has a new PCP (Kal[place] Nsumanganyi). PCP requested for her last [place] results to be faxed. Last OV note, US pelvis and abdomen done 04/13/21 faxed to PCP via HIM release.

## 2021-07-17 ENCOUNTER — Encounter: Payer: Self-pay | Admitting: Urology

## 2021-07-17 ENCOUNTER — Ambulatory Visit: Payer: PPO | Admitting: Urology

## 2021-07-17 ENCOUNTER — Other Ambulatory Visit: Payer: Self-pay

## 2021-07-17 VITALS — BP 139/71 | HR 60 | Temp 98.7°F

## 2021-07-17 DIAGNOSIS — R32 Unspecified urinary incontinence: Secondary | ICD-10-CM | POA: Diagnosis not present

## 2021-07-17 DIAGNOSIS — R351 Nocturia: Secondary | ICD-10-CM | POA: Diagnosis not present

## 2021-07-17 LAB — URINALYSIS, ROUTINE W REFLEX MICROSCOPIC
Bilirubin, UA: NEGATIVE
Glucose, UA: NEGATIVE
Leukocytes,UA: NEGATIVE
Nitrite, UA: NEGATIVE
Protein,UA: NEGATIVE
RBC, UA: NEGATIVE
Specific Gravity, UA: 1.015 (ref 1.005–1.030)
Urobilinogen, Ur: 0.2 mg/dL (ref 0.2–1.0)
pH, UA: 6 (ref 5.0–7.5)

## 2021-07-17 MED ORDER — GEMTESA 75 MG PO TABS: 1.0000 | ORAL_TABLET | Freq: Every day | ORAL | 11 refills | Status: DC

## 2021-07-17 NOTE — Progress Notes (Signed)
Urological Symptom Review  Patient is experiencing the following symptoms: Hard to postpone urination   Review of Systems  Gastrointestinal (upper)  : Negative for upper GI symptoms  Gastrointestinal (lower) : Negative for lower GI symptoms  Constitutional : Negative for symptoms  Skin: Negative for skin symptoms  Eyes: Blurred vision  Ear/Nose/Throat : Negative for Ear/Nose/Throat symptoms  Hematologic/Lymphatic: Negative for Hematologic/Lymphatic symptoms  Cardiovascular : Leg swelling  Respiratory : Negative for respiratory symptoms  Endocrine: Negative for endocrine symptoms  Musculoskeletal: Back pain Joint pain  Neurological: Negative for neurological symptoms  Psychologic: Negative for psychiatric symptoms

## 2021-07-17 NOTE — Progress Notes (Signed)
07/17/2021 1:50 PM   Katherine Joseph 1935-02-19 [id number]  Referring provider: Doree Albee, MD No address on file  Followup urge incontinence and nocturia   HPI: Katherine Joseph is a 86yo here for followup for urge incontinence and nocturia. Katherine Joseph worked well but she stopped medication since she did not have refills. She has daily urge incontinence. Nocturia 0-1x. She leaks on the way to the bathroom. Urine stream strong. No other complaints today   PMH: Past Medical History:  Diagnosis Date   Arthritis    Fluttering muscles SEP 2009   MBS/BASW NL   GERD (gastroesophageal reflux disease)    Hemorrhoids, internal OCT 2011   Hereditary and idiopathic peripheral neuropathy 10/04/2016   Hyperlipidemia    Hypertension    IBS (irritable bowel syndrome)    Prediabetes     Surgical History: Past Surgical History:  Procedure Laterality Date   COLONOSCOPY  2004   RMR/NUR INFLAMMATORY POLYP, POST POLYPECTOMY BLEED   COLONOSCOPY  oct 2011 SLF nvd-WEIGHT LOSS   NL TI, [place]/DC TICS   ESOPHAGOGASTRODUODENOSCOPY  2006   W/DILATION W/RMR   KNEE ARTHROSCOPY     RIGHT FOOT SURGERY     TOTAL KNEE ARTHROPLASTY Left 05/21/2015   Procedure: TOTAL KNEE ARTHROPLASTY;  Surgeon: Katherine Civil, MD;  Location: [facility];  Service: Orthopedics;  Laterality: Left;    Home Medications:  Allergies as of 07/17/2021       Reactions   Sulfa Antibiotics Hives   Iodinated Diagnostic Agents Rash        Medication List        Accurate as of July 17, 2021  1:50 PM. If you have any questions, ask your nurse or doctor.          amLODipine 10 MG tablet Commonly known as: NORVASC Take 1 tablet (10 mg total) by mouth daily.   aspirin 81 MG tablet Take 81 mg by mouth every morning.   atorvastatin 20 MG tablet Commonly known as: LIPITOR Take 1 tablet (20 mg total) by mouth daily.   divalproex 250 MG 24 hr tablet Commonly known as: DEPAKOTE ER TAKE THREE (3) TABLETS BY MOUTH AT  NIGHT   DULoxetine 60 MG capsule Commonly known as: CYMBALTA TAKE ONE CAPSULE BY MOUTH DAILY   gabapentin 300 MG capsule Commonly known as: NEURONTIN TAKE 1 OR 2 CAPSULES BY MOUTH UP TO THREE TIMES DAILY FOR BURNING PAIN IN FEET.   Gemtesa 75 MG Tabs Generic drug: Vibegron Take 1 capsule by mouth at bedtime.   metoprolol tartrate 25 MG tablet Commonly known as: LOPRESSOR Take 1 tablet (25 mg total) by mouth 2 (two) times daily.   mirabegron ER 50 MG Tb24 tablet Commonly known as: MYRBETRIQ Take 1 tablet (50 mg total) by mouth daily.   omeprazole 20 MG capsule Commonly known as: PRILOSEC Take 1 capsule (20 mg total) by mouth daily.   potassium chloride SA 20 MEQ tablet Commonly known as: KLOR-CON TAKE ONE TABLET BY MOUTH DAILY   torsemide 20 MG tablet Commonly known as: DEMADEX Take 1 tablet (20 mg total) by mouth as needed.        Allergies:  Allergies  Allergen Reactions   Sulfa Antibiotics Hives   Iodinated Diagnostic Agents Rash    Family History: Family History  Problem Relation Age of Onset   Alzheimer's disease Mother    Dementia Mother    Cancer Father        stomach   Alcohol abuse  Brother    Hypertension Other    Cancer Other    Colon polyps Neg Hx    Colon cancer Neg Hx     Social History:  reports that she has never smoked. Her smokeless tobacco use includes snuff. She reports that she does not drink alcohol and does not use drugs.  ROS: All other review of systems were reviewed and are negative except what is noted above in HPI  Physical Exam: BP 139/71   Pulse 60   Temp 98.7 F (37.1 C)   Constitutional:  Alert and oriented, No acute distress. HEENT: [place] AT, moist mucus membranes.  Trachea midline, no masses. Cardiovascular: No clubbing, cyanosis, or edema. Respiratory: Normal respiratory effort, no increased work of breathing. GI: Abdomen is soft, nontender, nondistended, no abdominal masses GU: No CVA tenderness.  Lymph: No  cervical or inguinal lymphadenopathy. Skin: No rashes, bruises or suspicious lesions. Neurologic: Grossly intact, no focal deficits, moving all 4 extremities. Psychiatric: Normal mood and affect.  Laboratory Data: Lab Results  Component Value Date   WBC 8.5 01/24/2020   HGB 12.2 01/24/2020   HCT 39.8 01/24/2020   MCV 77.3 (L) 01/24/2020   PLT 202 01/24/2020    Lab Results  Component Value Date   CREATININE 1.04 (H) 02/09/2021    No results found for: PSA  No results found for: TESTOSTERONE  Lab Results  Component Value Date   HGBA1C 6.0 (H) 02/09/2021    Urinalysis    Component Value Date/Time   COLORURINE DARK YELLOW 02/21/2020 1136   APPEARANCEUR Clear 01/09/2021 1457   LABSPEC 1.032 02/21/2020 1136   PHURINE 6.0 02/21/2020 1136   GLUCOSEU Negative 01/09/2021 1457   HGBUR NEGATIVE 02/21/2020 1136   BILIRUBINUR Negative 01/09/2021 1457   KETONESUR TRACE (A) 02/21/2020 1136   PROTEINUR Trace (A) 01/09/2021 1457   PROTEINUR TRACE (A) 02/21/2020 1136   NITRITE Negative 01/09/2021 1457   NITRITE NEGATIVE 05/14/2016 1945   LEUKOCYTESUR Negative 01/09/2021 1457    Lab Results  Component Value Date   LABMICR Comment 01/09/2021   WBCUA >30 (A) 04/14/2020   LABEPIT 0-10 04/14/2020   BACTERIA Few (A) 04/14/2020    Pertinent Imaging:  Results for orders placed during the hospital encounter of 09/24/08  DG Abd 1 View  Narrative Clinical Data: Left renal calculus, follow-up  ABDOMEN - 1 VIEW  Comparison: 03/27/2008  Findings: Diffuse bony demineralization. Levoconvex scoliosis lumbar spine. No definite left renal calculus identified. Small pelvic phleboliths stable. Slight prominent stool in proximal half of colon. Bowel gas pattern otherwise normal. No definite urinary tract calcification identified.  IMPRESSION: No definite urinary tract calcification seen.  Provider: Briscoe Joseph  No results found for this or any previous visit.  No results  found for this or any previous visit.  No results found for this or any previous visit.  No results found for this or any previous visit.  No results found for this or any previous visit.  No results found for this or any previous visit.  No results found for this or any previous visit.   Assessment & Plan:    1. Urinary incontinence, unspecified type -Restart gemtes 75mg  - Urinalysis, Routine w reflex microscopic  2. Nocturia Gemtesa 75mg  - Urinalysis, Routine w reflex microscopic   No follow-ups on file.  Nicolette Bang, MD  [facility] [place]

## 2021-07-17 NOTE — Patient Instructions (Signed)

## 2021-07-28 ENCOUNTER — Encounter: Payer: Self-pay | Admitting: Internal Medicine

## 2021-09-07 ENCOUNTER — Other Ambulatory Visit (HOSPITAL_COMMUNITY): Payer: Self-pay | Admitting: Adult Health

## 2021-09-07 DIAGNOSIS — Z1231 Encounter for screening mammogram for malignant neoplasm of breast: Secondary | ICD-10-CM

## 2021-09-10 ENCOUNTER — Encounter (INDEPENDENT_AMBULATORY_CARE_PROVIDER_SITE_OTHER): Payer: PPO | Admitting: Nurse Practitioner

## 2021-09-17 DIAGNOSIS — G629 Polyneuropathy, unspecified: Secondary | ICD-10-CM | POA: Diagnosis not present

## 2021-09-17 DIAGNOSIS — Z0000 Encounter for general adult medical examination without abnormal findings: Secondary | ICD-10-CM | POA: Diagnosis not present

## 2021-09-17 DIAGNOSIS — I10 Essential (primary) hypertension: Secondary | ICD-10-CM | POA: Diagnosis not present

## 2021-09-23 ENCOUNTER — Other Ambulatory Visit: Payer: Self-pay

## 2021-09-23 DIAGNOSIS — R32 Unspecified urinary incontinence: Secondary | ICD-10-CM

## 2021-09-23 MED ORDER — GEMTESA 75 MG PO TABS: 1.0000 | ORAL_TABLET | Freq: Every day | ORAL | 11 refills | Status: DC

## 2021-09-28 ENCOUNTER — Other Ambulatory Visit: Payer: Self-pay

## 2021-09-28 ENCOUNTER — Ambulatory Visit (HOSPITAL_COMMUNITY)
Admission: RE | Admit: 2021-09-28 | Discharge: 2021-09-28 | Disposition: A | Discharge status: Home / Self Care | Payer: PPO | Source: Ambulatory Visit | Attending: Adult Health | Admitting: Adult Health

## 2021-09-28 DIAGNOSIS — Z1231 Encounter for screening mammogram for malignant neoplasm of breast: Secondary | ICD-10-CM | POA: Insufficient documentation

## 2021-11-13 ENCOUNTER — Encounter (HOSPITAL_COMMUNITY): Payer: Self-pay | Admitting: Radiology

## 2021-12-31 ENCOUNTER — Encounter: Payer: Self-pay | Admitting: Internal Medicine

## 2022-01-15 ENCOUNTER — Ambulatory Visit: Payer: PPO | Admitting: Urology

## 2022-01-15 ENCOUNTER — Encounter: Payer: Self-pay | Admitting: Urology

## 2022-01-15 VITALS — BP 157/78 | HR 67

## 2022-01-15 DIAGNOSIS — R351 Nocturia: Secondary | ICD-10-CM

## 2022-01-15 DIAGNOSIS — R32 Unspecified urinary incontinence: Secondary | ICD-10-CM | POA: Diagnosis not present

## 2022-01-15 LAB — MICROSCOPIC EXAMINATION
RBC, Urine: NONE SEEN /hpf (ref 0–2)
Renal Epithel, UA: NONE SEEN /hpf

## 2022-01-15 LAB — URINALYSIS, ROUTINE W REFLEX MICROSCOPIC
Bilirubin, UA: NEGATIVE
Glucose, UA: NEGATIVE
Ketones, UA: NEGATIVE
Nitrite, UA: NEGATIVE
Protein,UA: NEGATIVE
RBC, UA: NEGATIVE
Specific Gravity, UA: 1.015 (ref 1.005–1.030)
Urobilinogen, Ur: 0.2 mg/dL (ref 0.2–1.0)
pH, UA: 6.5 (ref 5.0–7.5)

## 2022-01-15 MED ORDER — TROSPIUM CHLORIDE 20 MG PO TABS: 20.0000 mg | ORAL_TABLET | Freq: Two times a day (BID) | ORAL | 11 refills | Status: DC

## 2022-01-15 MED ORDER — GEMTESA 75 MG PO TABS: 1.0000 | ORAL_TABLET | Freq: Every day | ORAL | 11 refills | Status: DC

## 2022-01-15 NOTE — Progress Notes (Signed)
? ?01/15/2022 ?11:06 AM  ? ?Katherine Joseph ?[birth year] ?[id number] ? ?Referring provider: Yves Dill, NP ?[address] ?[name] ?[place],  [place] [address] ? ?Followup OAb and nocturia ? ? ?HPI: ?Katherine Joseph is a 87yo here for followup for OAB and nocturia. She is on gemtesa '75mg'$  daily. Nocturia 1-2x. She has not had significant improvement in her urinary urgency and urge incontinence. She uses 3-4 pads per day. Urinary frequency is improved.  ? ? ?PMH: ?Past Medical History:  ?Diagnosis Date  ? Arthritis   ? Fluttering muscles SEP 2009  ? MBS/BASW NL  ? GERD (gastroesophageal reflux disease)   ? Hemorrhoids, internal OCT 2011  ? Hereditary and idiopathic peripheral neuropathy 10/04/2016  ? Hyperlipidemia   ? Hypertension   ? IBS (irritable bowel syndrome)   ? Prediabetes   ? ? ?Surgical History: ?Past Surgical History:  ?Procedure Laterality Date  ? COLONOSCOPY  2004  ? RMR/NUR INFLAMMATORY POLYP, POST POLYPECTOMY BLEED  ? COLONOSCOPY  oct 2011 SLF nvd-WEIGHT LOSS  ? NL TI, [place]/DC TICS  ? ESOPHAGOGASTRODUODENOSCOPY  2006  ? W/DILATION W/RMR  ? KNEE ARTHROSCOPY    ? RIGHT FOOT SURGERY    ? TOTAL KNEE ARTHROPLASTY Left 05/21/2015  ? Procedure: TOTAL KNEE ARTHROPLASTY;  Surgeon: Carole Civil, MD;  Location: [facility];  Service: Orthopedics;  Laterality: Left;  ? ? ?Home Medications:  ?Allergies as of 01/15/2022   ? ?   Reactions  ? Sulfa Antibiotics Hives  ? Iodinated Contrast Media Rash  ? ?  ? ?  ?Medication List  ?  ? ?  ? Accurate as of January 15, 2022 11:06 AM. If you have any questions, ask your nurse or doctor.  ?  ?  ? ?  ? ?amLODipine 10 MG tablet ?Commonly known as: NORVASC ?Take 1 tablet (10 mg total) by mouth daily. ?  ?aspirin 81 MG tablet ?Take 81 mg by mouth every morning. ?  ?atorvastatin 20 MG tablet ?Commonly known as: LIPITOR ?Take 1 tablet (20 mg total) by mouth daily. ?  ?divalproex 250 MG 24 hr tablet ?Commonly known as: DEPAKOTE ER ?TAKE THREE (3) TABLETS BY MOUTH AT NIGHT ?   ?DULoxetine 60 MG capsule ?Commonly known as: CYMBALTA ?TAKE ONE CAPSULE BY MOUTH DAILY ?  ?gabapentin 300 MG capsule ?Commonly known as: NEURONTIN ?TAKE 1 OR 2 CAPSULES BY MOUTH UP TO THREE TIMES DAILY FOR BURNING PAIN IN FEET. ?  ?Gemtesa 75 MG Tabs ?Generic drug: Vibegron ?Take 1 capsule by mouth at bedtime. ?  ?levothyroxine 25 MCG tablet ?Commonly known as: SYNTHROID ?Take 25 mcg by mouth every morning. ?  ?metoprolol tartrate 25 MG tablet ?Commonly known as: LOPRESSOR ?Take 1 tablet (25 mg total) by mouth 2 (two) times daily. ?  ?mirabegron ER 50 MG Tb24 tablet ?Commonly known as: MYRBETRIQ ?Take 1 tablet (50 mg total) by mouth daily. ?  ?omeprazole 20 MG capsule ?Commonly known as: PRILOSEC ?Take 1 capsule (20 mg total) by mouth daily. ?  ?potassium chloride SA 20 MEQ tablet ?Commonly known as: KLOR-CON M ?TAKE ONE TABLET BY MOUTH DAILY ?  ?torsemide 20 MG tablet ?Commonly known as: DEMADEX ?Take 1 tablet (20 mg total) by mouth as needed. ?  ? ?  ? ? ?Allergies:  ?Allergies  ?Allergen Reactions  ? Sulfa Antibiotics Hives  ? Iodinated Contrast Media Rash  ? ? ?Family History: ?Family History  ?Problem Relation Age of Onset  ? Alzheimer's disease Mother   ? Dementia  Mother   ? Cancer Father   ?     stomach  ? Alcohol abuse Brother   ? Hypertension Other   ? Cancer Other   ? Colon polyps Neg Hx   ? Colon cancer Neg Hx   ? ? ?Social History:  reports that she has never smoked. Her smokeless tobacco use includes snuff. She reports that she does not drink alcohol and does not use drugs. ? ?ROS: ?All other review of systems were reviewed and are negative except what is noted above in HPI ? ?Physical Exam: ?BP (!) 157/78   Pulse 67   ?Constitutional:  Alert and oriented, No acute distress. ?HEENT: [place] AT, moist mucus membranes.  Trachea midline, no masses. ?Cardiovascular: No clubbing, cyanosis, or edema. ?Respiratory: Normal respiratory effort, no increased work of breathing. ?GI: Abdomen is soft, nontender,  nondistended, no abdominal masses ?GU: No CVA tenderness.  ?Lymph: No cervical or inguinal lymphadenopathy. ?Skin: No rashes, bruises or suspicious lesions. ?Neurologic: Grossly intact, no focal deficits, moving all 4 extremities. ?Psychiatric: Normal mood and affect. ? ?Laboratory Data: ?Lab Results  ?Component Value Date  ? WBC 8.5 01/24/2020  ? HGB 12.2 01/24/2020  ? HCT 39.8 01/24/2020  ? MCV 77.3 (L) 01/24/2020  ? PLT 202 01/24/2020  ? ? ?Lab Results  ?Component Value Date  ? CREATININE 1.04 (H) 02/09/2021  ? ? ?No results found for: PSA ? ?No results found for: TESTOSTERONE ? ?Lab Results  ?Component Value Date  ? HGBA1C 6.0 (H) 02/09/2021  ? ? ?Urinalysis ?   ?Component Value Date/Time  ? [place] YELLOW 02/21/2020 1136  ? APPEARANCEUR Clear 07/17/2021 1349  ? LABSPEC 1.032 02/21/2020 1136  ? PHURINE 6.0 02/21/2020 1136  ? GLUCOSEU Negative 07/17/2021 1349  ? HGBUR NEGATIVE 02/21/2020 1136  ? BILIRUBINUR Negative 07/17/2021 1349  ? KETONESUR TRACE (A) 02/21/2020 1136  ? PROTEINUR Negative 07/17/2021 1349  ? PROTEINUR TRACE (A) 02/21/2020 1136  ? NITRITE Negative 07/17/2021 1349  ? NITRITE NEGATIVE 05/14/2016 1945  ? LEUKOCYTESUR Negative 07/17/2021 1349  ? ? ?Lab Results  ?Component Value Date  ? LABMICR Comment 07/17/2021  ? [place] >30 (A) 04/14/2020  ? LABEPIT 0-10 04/14/2020  ? BACTERIA Few (A) 04/14/2020  ? ? ?Pertinent Imaging: ? ?Results for orders placed during the hospital encounter of 09/24/08 ? ?DG Abd 1 View ? ?Narrative ?Clinical Data: Left renal calculus, follow-up ? ?ABDOMEN - 1 VIEW ? ?Comparison: 03/27/2008 ? ?Findings: ?Diffuse bony demineralization. ?Levoconvex scoliosis lumbar spine. ?No definite left renal calculus identified. ?Small pelvic phleboliths stable. ?Slight prominent stool in proximal half of colon. ?Bowel gas pattern otherwise normal. ?No definite urinary tract calcification identified. ? ?IMPRESSION: ?No definite urinary tract calcification seen. ? ?Provider: Briscoe Burns ? ?No results found for this or any previous visit. ? ?No results found for this or any previous visit. ? ?No results found for this or any previous visit. ? ?No results found for this or any previous visit. ? ?No results found for this or any previous visit. ? ?No results found for this or any previous visit. ? ?No results found for this or any previous visit. ? ? ?Assessment & Plan:   ? ?1. Urinary incontinence, unspecified type ?-We will trial trospium '20mg'$  BID ?- Urinalysis, Routine w reflex microscopic ?- Vibegron (GEMTESA) 75 MG TABS; Take 1 capsule by mouth at bedtime.  Dispense: 30 tablet; Refill: 11 ? ?2. Nocturia ?-Trospium '20mg'$  BID ? ? ?No follow-ups on file. ? ?Nicolette Bang, MD ? ?  [facility] ? ? ?

## 2022-01-15 NOTE — Patient Instructions (Signed)

## 2022-01-20 ENCOUNTER — Ambulatory Visit: Payer: PPO | Admitting: Gastroenterology

## 2022-01-20 ENCOUNTER — Other Ambulatory Visit: Payer: Self-pay

## 2022-01-20 ENCOUNTER — Encounter: Payer: Self-pay | Admitting: Gastroenterology

## 2022-01-20 ENCOUNTER — Telehealth: Payer: Self-pay

## 2022-01-20 VITALS — BP 120/72 | HR 76 | Temp 96.8°F | Ht 68.0 in | Wt 205.2 lb

## 2022-01-20 DIAGNOSIS — K219 Gastro-esophageal reflux disease without esophagitis: Secondary | ICD-10-CM

## 2022-01-20 DIAGNOSIS — N83201 Unspecified ovarian cyst, right side: Secondary | ICD-10-CM

## 2022-01-20 DIAGNOSIS — K5900 Constipation, unspecified: Secondary | ICD-10-CM

## 2022-01-20 DIAGNOSIS — R9389 Abnormal findings on diagnostic imaging of other specified body structures: Secondary | ICD-10-CM

## 2022-01-20 DIAGNOSIS — R140 Abdominal distension (gaseous): Secondary | ICD-10-CM

## 2022-01-20 MED ORDER — DIPHENHYDRAMINE HCL 25 MG PO CAPS: 50.0000 mg | ORAL_CAPSULE | Freq: Once | ORAL | 0 refills | Status: DC

## 2022-01-20 MED ORDER — PREDNISONE 50 MG PO TABS: ORAL_TABLET | ORAL | 0 refills | Status: DC

## 2022-01-20 NOTE — Progress Notes (Signed)
? ? ? ?GI Office Note   ? ?Referring Provider: Ginny Forth* ?Primary Care Physician:  Yves Dill, NP  ?Primary Gastroenterologist: Elon Alas. Abbey Chatters, DO ? ? ?Chief Complaint  ? ?Chief Complaint  ?Patient presents with  ? Follow-up  ?  Thinks she may be having some bloating.   ? ? ?History of Present Illness  ? ?Katherine Joseph is a 87 y.o. female presenting today for follow-up.  Last seen in July 2022.  She has a history of chronic reflux/dyspepsia, constipation.  She has a history of multiple liver cysts, right ovarian cyst seen on prior imaging, CT 2018. At time of last visit we made arrangements for abdominal/pelvic ultrasound.  She had fatty liver, benign-appearing cysts in the left lobe of the liver measuring up to 2.6 cm, right ovarian cyst measuring 5.9 m, slight increase in size, endometrial lining thickened.  We recommended GYN referral. ? ?Weight is down 3 pounds since July 2022. Feels like stomach bigger has noticed over the past year. No stomach pain. Good appetite. Heartburn controlled. BM regular. No melena, brbpr. Weight fluctuates 5-10 pounds. No vaginal bleeding or dysuria.  ? ? ?Medications  ? ?Current Outpatient Medications  ?Medication Sig Dispense Refill  ? amLODipine (NORVASC) 10 MG tablet Take 1 tablet (10 mg total) by mouth daily. 90 tablet 1  ? aspirin 81 MG tablet Take 81 mg by mouth every morning.     ? atorvastatin (LIPITOR) 20 MG tablet Take 1 tablet (20 mg total) by mouth daily. 90 tablet 1  ? divalproex (DEPAKOTE ER) 250 MG 24 hr tablet TAKE THREE (3) TABLETS BY MOUTH AT NIGHT 90 tablet 3  ? DULoxetine (CYMBALTA) 60 MG capsule TAKE ONE CAPSULE BY MOUTH DAILY 30 capsule 2  ? gabapentin (NEURONTIN) 300 MG capsule TAKE 1 OR 2 CAPSULES BY MOUTH UP TO THREE TIMES DAILY FOR BURNING PAIN IN FEET. 180 capsule 3  ? levothyroxine (SYNTHROID) 25 MCG tablet Take 25 mcg by mouth every morning.    ? metoprolol tartrate (LOPRESSOR) 25 MG tablet Take 25 mg by mouth 2  (two) times daily.    ? omeprazole (PRILOSEC) 20 MG capsule Take 1 capsule (20 mg total) by mouth daily. 90 capsule 1  ? potassium chloride SA (KLOR-CON) 20 MEQ tablet TAKE ONE TABLET BY MOUTH DAILY 30 tablet 3  ? torsemide (DEMADEX) 20 MG tablet Take 1 tablet (20 mg total) by mouth as needed. 30 tablet 3  ? trospium (SANCTURA) 20 MG tablet Take 1 tablet (20 mg total) by mouth 2 (two) times daily. 60 tablet 11  ? ?No current facility-administered medications for this visit.  ? ? ?Allergies  ? ?Allergies as of 01/20/2022 - Review Complete 01/20/2022  ?Allergen Reaction Noted  ? Sulfa antibiotics Hives 08/29/2014  ? Iodinated contrast media Rash 01/13/2011  ?  ?  ?  ?Review of Systems  ? ?General: Negative for anorexia, fever, chills, fatigue, weakness. See hpi ?ENT: Negative for hoarseness, difficulty swallowing , nasal congestion. ?CV: Negative for chest pain, angina, palpitations, dyspnea on exertion, peripheral edema.  ?Respiratory: Negative for dyspnea at rest, dyspnea on exertion, cough, sputum, wheezing.  ?GI: See history of present illness. ?GU:  Negative for dysuria, hematuria, urinary incontinence, urinary frequency, nocturnal urination.  ?Endo: Negative for unusual weight change.  ?   ?Physical Exam  ? ?BP 120/72 (BP Location: Right Arm, Patient Position: Sitting, Cuff Size: Normal)   Pulse 76   Temp (!) 96.8 ?F (36 ?C) (Temporal)  Ht '5\' 8"'$  (1.727 m)   Wt 205 lb 3.2 oz (93.1 kg)   SpO2 95%   BMI 31.20 kg/m?  ?  ?General: Well-nourished, well-developed in no acute distress.  ?Eyes: No icterus. ?Mouth: Oropharyngeal mucosa moist and pink , no lesions erythema or exudate. ?Lungs: Clear to auscultation bilaterally.  ?Heart: Regular rate and rhythm, no murmurs rubs or gallops.  ?Abdomen: Bowel sounds are normal, nontender, mildly distended, no hepatosplenomegaly or masses,  ?no abdominal bruits or hernia , no rebound or guarding.  ?Rectal: not performed ?Extremities: No lower extremity edema. No clubbing  or deformities. ?Neuro: Alert and oriented x 4   ?Skin: Warm and dry, no jaundice.   ?Psych: Alert and cooperative, normal mood and affect. ? ?Labs  ? ?Lab Results  ?Component Value Date  ? CREATININE 1.04 (H) 02/09/2021  ? BUN 19 02/09/2021  ? NA 139 02/09/2021  ? K 4.3 02/09/2021  ? CL 103 02/09/2021  ? CO2 30 02/09/2021  ? ?Lab Results  ?Component Value Date  ? ALT 14 02/09/2021  ? AST 18 02/09/2021  ? ALKPHOS 62 06/02/2010  ? BILITOT 0.3 02/09/2021  ? ?Lab Results  ?Component Value Date  ? WBC 8.5 01/24/2020  ? HGB 12.2 01/24/2020  ? HCT 39.8 01/24/2020  ? MCV 77.3 (L) 01/24/2020  ? PLT 202 01/24/2020  ? ? ?Imaging Studies  ? ?No results found. ? ?Assessment  ? ?Abdominal distention: history of right ovarian cyst, thickened endometrium in the past. Need to rule out significant gyn process such as underlying malignancy.  ? ?Constipation: doing ok at this time. BMs regularly. ? ?GERD: well controlled on omeprazole.  ? ? ?PLAN  ? ?CT A/P with contrast. She developed rash with contrast before. Will premedicate with prednisone, benadryl.  ?Continue omeprazole '20mg'$  daily for acid reflux. ?CMET. ? ? ?Laureen Ochs. Alize Borrayo, MHS, PA-C ?[facility] ? ?

## 2022-01-20 NOTE — Patient Instructions (Signed)
Please get labs done at least 3 days before your CT scan. ?You will need to take two medications before your scheduled CT scan because we have you listed as developing a rash once before after taking contrast for the CT scan. You need to pick up prescription for prednisone. You will take one pill 13 hours, 7 hours, and 1 hour before your scheduled CT scan. You also need to buy over the counter benadryl and take '50mg'$  (2 pills) one hour before your scheduled CT scan.  ?Continue omeprazole '20mg'$  daily for acid reflux. ?

## 2022-01-20 NOTE — Telephone Encounter (Signed)
CT abd/pelvis w/contrast scheduled for 02/26/22 at 3:00pm, arrive at 2:45pm. Pick up contrast before day of test. Nothing to eat or drink for 4 hours prior to test. ? ?Tried to call pt, LMOVM to inform her appt has been scheduled and appt letter mailed. ?

## 2022-02-24 ENCOUNTER — Other Ambulatory Visit: Payer: Self-pay | Admitting: Gastroenterology

## 2022-02-25 ENCOUNTER — Ambulatory Visit: Payer: PPO | Admitting: Obstetrics & Gynecology

## 2022-02-25 ENCOUNTER — Encounter: Payer: Self-pay | Admitting: Obstetrics & Gynecology

## 2022-02-25 ENCOUNTER — Encounter: Payer: PPO | Admitting: Obstetrics & Gynecology

## 2022-02-25 VITALS — BP 115/66 | HR 64 | Ht 68.0 in | Wt 201.0 lb

## 2022-02-25 DIAGNOSIS — R9389 Abnormal findings on diagnostic imaging of other specified body structures: Secondary | ICD-10-CM

## 2022-02-25 DIAGNOSIS — N83201 Unspecified ovarian cyst, right side: Secondary | ICD-10-CM

## 2022-02-25 DIAGNOSIS — Z780 Asymptomatic menopausal state: Secondary | ICD-10-CM

## 2022-02-25 LAB — COMPREHENSIVE METABOLIC PANEL WITH GFR
ALT: 17 IU/L (ref 0–32)
AST: 20 IU/L (ref 0–40)
Albumin/Globulin Ratio: 1.3 (ref 1.2–2.2)
Albumin: 4.1 g/dL (ref 3.6–4.6)
Alkaline Phosphatase: 70 IU/L (ref 44–121)
BUN/Creatinine Ratio: 15 (ref 12–28)
BUN: 18 mg/dL (ref 8–27)
Bilirubin Total: <0.2 mg/dL (ref 0.0–1.2)
CO2: 24 mmol/L (ref 20–29)
Calcium: 9.4 mg/dL (ref 8.7–10.3)
Chloride: 102 mmol/L (ref 96–106)
Creatinine, Ser: 1.24 mg/dL — ABNORMAL HIGH (ref 0.57–1.00)
Globulin, Total: 3.2 g/dL (ref 1.5–4.5)
Glucose: 113 mg/dL — ABNORMAL HIGH (ref 70–99)
Potassium: 4.5 mmol/L (ref 3.5–5.2)
Sodium: 139 mmol/L (ref 134–144)
Total Protein: 7.3 g/dL (ref 6.0–8.5)
eGFR: 43 mL/min/1.73 — ABNORMAL LOW

## 2022-02-25 NOTE — Progress Notes (Signed)
   GYN VISIT Patient name: Katherine Joseph MRN [id number]  Date of birth: May 15, 1935 Chief Complaint:   Ovarian Cyst  History of Present Illness:   Katherine Joseph is a 87 y.o. G19P9 PM female being seen today for ovarian cyst.  Pt is not exactly sure why she is here.  When prompted about an ovarian cyst, she states it has been present for quite some time.  Denies pelvic pain. Denies vaginal bleeding.  Denies bloating or discomfort.  Denis change in appetite.  No acute gyn concerns.  Records reviewed- ovarian cyst has been present since 2018  [place] last completed 03/2021, I independently reviewed [place] images.  Right ovary with simple unilocular smooth-walled cyst- measuring ~ 5-6cm.  No other abnormalities noted  No LMP recorded. Patient is postmenopausal.     02/25/2022   11:04 AM 11/12/2020   10:52 AM 09/08/2020   10:52 AM 08/31/2019   10:58 AM  Depression screen PHQ 2/9  Decreased Interest 2 0 0 0  Down, Depressed, Hopeless 0 0 0 0  PHQ - 2 Score 2 0 0 0  Altered sleeping 0 0 0   Tired, decreased energy 0 0 0   Change in appetite 0 0 0   Feeling bad or failure about yourself  0 0 0   Trouble concentrating 0 0 0   Moving slowly or fidgety/restless 0 0 0   Suicidal thoughts 0 0 0   PHQ-9 Score 2 0 0   Difficult doing work/chores  Not difficult at all Not difficult at all      Review of Systems:   Pertinent items are noted in HPI Denies fever/chills, dizziness, headaches, visual disturbances, fatigue, shortness of breath, chest pain, abdominal pain, vomiting,  Pertinent History Reviewed:  Reviewed past medical,surgical, social, obstetrical and family history.  Reviewed problem list, medications and allergies. Physical Assessment:   Vitals:   02/25/22 1056  BP: 115/66  Pulse: 64  Weight: 201 lb (91.2 kg)  Height: '5\' 8"'$  (1.727 m)  Body mass index is 30.56 kg/m.       Physical Examination:   General appearance: alert, well appearing, and in no distress  Psych: mood  appropriate, normal affect  Skin: warm & dry   Cardiovascular: normal heart rate noted  Respiratory: normal respiratory effort, no distress  Abdomen: soft, non-tender, no rebound, no guarding, no pelvic pain  Pelvic: examination not indicated  Extremities: no edema   Chaperone: N/A    Assessment & Plan:  1) Ovarian cyst -Based on ORADS criteria- current cyst is Cat 2- meaning it is almost certainly benign and a <1% risk of malignancy -Additionally pt is asymptomatic and does not want surgical intervention -Discussed what surgical intervention would require and pt declined -do not think any further imaging or follow up is warranted at this time -should she ever note pelvic pain/discomfort or acute changes would plan for [place] at that time -Questions and concerns were addressed  2) Thickened endometrium -previously noted, prior EMB collected 2018 with benign findings  Again reviewed with patient that should she note pelvic pain, decreased appetite, persistent bloating or vaginal bleeding- RTC  Return if symptoms worsen or fail to improve.   Janyth Pupa, DO Attending [place], [facility] for [employer], [place]

## 2022-02-26 ENCOUNTER — Ambulatory Visit (HOSPITAL_COMMUNITY)
Admission: RE | Admit: 2022-02-26 | Discharge: 2022-02-26 | Disposition: A | Discharge status: Home / Self Care | Payer: PPO | Source: Ambulatory Visit | Attending: Gastroenterology | Admitting: Gastroenterology

## 2022-02-26 DIAGNOSIS — K5900 Constipation, unspecified: Secondary | ICD-10-CM | POA: Diagnosis present

## 2022-02-26 DIAGNOSIS — N83201 Unspecified ovarian cyst, right side: Secondary | ICD-10-CM | POA: Insufficient documentation

## 2022-02-26 DIAGNOSIS — R140 Abdominal distension (gaseous): Secondary | ICD-10-CM | POA: Insufficient documentation

## 2022-02-26 DIAGNOSIS — R9389 Abnormal findings on diagnostic imaging of other specified body structures: Secondary | ICD-10-CM | POA: Insufficient documentation

## 2022-02-26 DIAGNOSIS — K219 Gastro-esophageal reflux disease without esophagitis: Secondary | ICD-10-CM | POA: Diagnosis present

## 2022-02-26 MED ORDER — IOHEXOL 300 MG/ML  SOLN
75.0000 mL | Freq: Once | INTRAMUSCULAR | Status: AC | PRN
  Administered 2022-02-26: 75 mL via INTRAVENOUS

## 2022-03-25 ENCOUNTER — Encounter: Payer: Self-pay | Admitting: Internal Medicine

## 2022-06-17 ENCOUNTER — Ambulatory Visit: Payer: PPO | Admitting: Orthopedic Surgery

## 2022-06-21 ENCOUNTER — Encounter: Payer: Self-pay | Admitting: Orthopedic Surgery

## 2022-06-21 ENCOUNTER — Ambulatory Visit: Payer: Medicare HMO | Admitting: Orthopedic Surgery

## 2022-06-21 ENCOUNTER — Ambulatory Visit (INDEPENDENT_AMBULATORY_CARE_PROVIDER_SITE_OTHER): Payer: Medicare HMO

## 2022-06-21 DIAGNOSIS — Z96652 Presence of left artificial knee joint: Secondary | ICD-10-CM | POA: Diagnosis not present

## 2022-06-21 DIAGNOSIS — M1710 Unilateral primary osteoarthritis, unspecified knee: Secondary | ICD-10-CM

## 2022-06-21 DIAGNOSIS — M25561 Pain in right knee: Secondary | ICD-10-CM

## 2022-06-21 DIAGNOSIS — M1711 Unilateral primary osteoarthritis, right knee: Secondary | ICD-10-CM | POA: Diagnosis not present

## 2022-06-21 DIAGNOSIS — M65322 Trigger finger, left index finger: Secondary | ICD-10-CM

## 2022-06-21 DIAGNOSIS — G8929 Other chronic pain: Secondary | ICD-10-CM

## 2022-06-21 MED ORDER — METHYLPREDNISOLONE ACETATE 40 MG/ML IJ SUSP
40.0000 mg | Freq: Once | INTRAMUSCULAR | Status: AC
  Administered 2022-06-21: 40 mg via INTRA_ARTICULAR

## 2022-06-21 NOTE — Progress Notes (Signed)
Chief Complaint  Patient presents with   Post-op Follow-up    Left knee 05/21/2015 total knee replacement    Hand Problem    Left index finger gets "caught"   Knee Pain    Right knee wants injection    Katherine Joseph has several issues today  1.  She is coming in for annual x-ray to monitor her left total knee which was done in 2016 notes a Sigma posterior stabilized fixed-bearing total knee its been functioning well  #2 she has a new problem with clicking and catching of her left index finger which is causing her to manually manipulated back into extension this is a new onset issue  #3 she is following up for chronic knee pain which is secondary to chronic arthritis and she has refused to proceed with surgery because of her age  Problem #1 x-ray left knee shows a stable knee no signs of loosening with normal alignment The knee flexion arc is 120 degrees with stable there is no effusion there is no pain she ambulates without assistive device  Problem #2 she has tenderness and swelling over the left index finger and she can lock it in place and manually manipulated back into extension the finger has normal neurovascular function  Problem #3 tenderness is noted over the lateral compartment small effusion knee flexion arc is 100 degrees slight flexion contracture and decreased overall flexion arc No instability is detected   Imaging of the left knee was done today in the office again normal alignment no signs of loosening  As far as the right knee goes single [facility] image shows degenerative arthritis lateral compartment   Problem #1 follow-up in a year for repeat imaging  Problem number 2 injection for trigger finger left index finger  Trigger finger injection  Diagnosis tenosynovitis left index finger Procedure injection A1 pulley Medications lidocaine 1% 1 mL and Depo-Medrol 40 mg 1 mL Skin prep alcohol and ethyl chloride Verbal consent was obtained Timeout confirmed the injection  site  After cleaning the skin with alcohol and anesthetizing the skin with ethyl chloride the A1 pulley was palpated and the injection was performed without complication   Problem number 3 injection right knee joint Procedure note right knee injection   verbal consent was obtained to inject right knee joint  Timeout was completed to confirm the site of injection  The medications used were depomedrol 40 mg and 1% lidocaine 3 cc Anesthesia was provided by ethyl chloride and the skin was prepped with alcohol.  After cleaning the skin with alcohol a 20-gauge needle was used to inject the right knee joint. There were no complications. A sterile bandage was applied.

## 2022-07-15 ENCOUNTER — Ambulatory Visit: Payer: Medicare HMO | Admitting: Physician Assistant

## 2022-07-15 VITALS — BP 149/76 | HR 65

## 2022-07-15 DIAGNOSIS — R32 Unspecified urinary incontinence: Secondary | ICD-10-CM | POA: Diagnosis not present

## 2022-07-15 DIAGNOSIS — N3000 Acute cystitis without hematuria: Secondary | ICD-10-CM

## 2022-07-15 DIAGNOSIS — R351 Nocturia: Secondary | ICD-10-CM | POA: Diagnosis not present

## 2022-07-15 LAB — BLADDER SCAN AMB NON-IMAGING: Scan Result: 54

## 2022-07-15 MED ORDER — NITROFURANTOIN MONOHYD MACRO 100 MG PO CAPS: 100.0000 mg | ORAL_CAPSULE | Freq: Two times a day (BID) | ORAL | 0 refills | Status: AC

## 2022-07-15 MED ORDER — GEMTESA 75 MG PO TABS: 75.0000 mg | ORAL_TABLET | Freq: Every evening | ORAL | 0 refills | Status: DC

## 2022-07-15 NOTE — Progress Notes (Signed)
Assessment: 1. Urinary incontinence, unspecified type - Urinalysis, Routine w reflex microscopic - Bladder Scan (Post Void Residual) in office  2. Acute cystitis without hematuria - Urine Culture  3. Nocturia    Plan: Urine sent for cx. Start Macrobid.  Will adjust therapy if indicated by culture results.  Samples of Gemtesa given and she will start taking in the evening.  Discussed fluid intake prior to bed, timed voiding, double voiding.  Also discussed and reminded patient of importance of tight glucose control to help with a decrease in urinary frequency.  She will follow-up in 6 to 8 weeks for UA and PVR.  Meds ordered this encounter  Medications   Vibegron (GEMTESA) 75 MG TABS    Sig: Take 75 mg by mouth at bedtime.    Dispense:  28 tablet    Refill:  0   nitrofurantoin, macrocrystal-monohydrate, (MACROBID) 100 MG capsule    Sig: Take 1 capsule (100 mg total) by mouth 2 (two) times daily for 7 days.    Dispense:  14 capsule    Refill:  0     Chief Complaint: No chief complaint on file.   HPI: Katherine Joseph is a 87 y.o. female who presents for continued evaluation of OAB and nocturia with urinary urgency. She has been on Trospium 20 mg BID for past 6 months. She is also on [place]  She reports continued feelings of urinary urgency mostly after 5 PM with 2-3 voids per night.  She feels the [name] has not been helpful by taking it in the morning.  She is not wearing pads.  No dysuria, burning, gross hematuria.  She does not limit p.o. intake before bed and reports ice tea with cranberry every evening with supper.  UA= 6-10 WBCs, many bacteria, nitrite negative PVR=23m  01/15/22 Katherine Joseph a 89yohere for followup for OAB and nocturia. She is on gemtesa '75mg'$  daily. Nocturia 1-2x. She has not had significant improvement in her urinary urgency and urge incontinence. She uses 3-4 pads per day. Urinary frequency is improved.   Portions of the above documentation were  copied from a prior visit for review purposes only.  Allergies: Allergies  Allergen Reactions   Sulfa Antibiotics Hives   Iodinated Contrast Media Rash    PMH: Past Medical History:  Diagnosis Date   Arthritis    Fluttering muscles SEP 2009   MBS/BASW NL   GERD (gastroesophageal reflux disease)    Hemorrhoids, internal OCT 2011   Hereditary and idiopathic peripheral neuropathy 10/04/2016   Hyperlipidemia    Hypertension    IBS (irritable bowel syndrome)    Prediabetes     PSH: Past Surgical History:  Procedure Laterality Date   COLONOSCOPY  2004   RMR/NUR INFLAMMATORY POLYP, POST POLYPECTOMY BLEED   COLONOSCOPY  oct 2011 SLF nvd-WEIGHT LOSS   NL TI, [place]/DC TICS   ESOPHAGOGASTRODUODENOSCOPY  2006   W/DILATION W/RMR   KNEE ARTHROSCOPY     RIGHT FOOT SURGERY     TOTAL KNEE ARTHROPLASTY Left 05/21/2015   Procedure: TOTAL KNEE ARTHROPLASTY;  Surgeon: SCarole Civil MD;  Location: [facility];  Service: Orthopedics;  Laterality: Left;    SH: Social History   Tobacco Use   Smoking status: Never   Smokeless tobacco: Current    Types: Snuff  Vaping Use   Vaping Use: Never used  Substance Use Topics   Alcohol use: No   Drug use: No    ROS: All other review of  systems were reviewed and are negative except what is noted above in HPI  PE: BP (!) 149/76   Pulse 65  GENERAL APPEARANCE:  Well appearing, well developed, well nourished, NAD HEENT:  Atraumatic, normocephalic NECK:  Supple. Trachea midline ABDOMEN:  Soft, non-tender, no masses EXTREMITIES:  Moves all extremities well, 2+ pitting edema NEUROLOGIC:  Alert and oriented x 3, normal gait, CN II-XII grossly intact MENTAL STATUS:  appropriate BACK:  Non-tender to palpation, No CVAT SKIN:  Warm, dry, and intact   Results: Laboratory Data: Lab Results  Component Value Date   WBC 8.5 01/24/2020   HGB 12.2 01/24/2020   HCT 39.8 01/24/2020   MCV 77.3 (L) 01/24/2020   PLT 202 01/24/2020    Lab Results   Component Value Date   CREATININE 1.24 (H) 02/24/2022     Lab Results  Component Value Date   HGBA1C 6.0 (H) 02/09/2021    Urinalysis    Component Value Date/Time   COLORURINE DARK YELLOW 02/21/2020 1136   APPEARANCEUR Clear 01/15/2022 1142   LABSPEC 1.032 02/21/2020 1136   PHURINE 6.0 02/21/2020 1136   GLUCOSEU Negative 01/15/2022 1142   HGBUR NEGATIVE 02/21/2020 1136   BILIRUBINUR Negative 01/15/2022 1142   KETONESUR TRACE (A) 02/21/2020 1136   PROTEINUR Negative 01/15/2022 1142   PROTEINUR TRACE (A) 02/21/2020 1136   NITRITE Negative 01/15/2022 1142   NITRITE NEGATIVE 05/14/2016 1945   LEUKOCYTESUR Trace (A) 01/15/2022 1142    Lab Results  Component Value Date   LABMICR See below: 01/15/2022   WBCUA 6-10 (A) 01/15/2022   LABEPIT 0-10 01/15/2022   BACTERIA Few 01/15/2022    Pertinent Imaging: Results for orders placed during the hospital encounter of 09/24/08  DG Abd 1 View  Narrative Clinical Data: Left renal calculus, follow-up  ABDOMEN - 1 VIEW  Comparison: 03/27/2008  Findings: Diffuse bony demineralization. Levoconvex scoliosis lumbar spine. No definite left renal calculus identified. Small pelvic phleboliths stable. Slight prominent stool in proximal half of colon. Bowel gas pattern otherwise normal. No definite urinary tract calcification identified.  IMPRESSION: No definite urinary tract calcification seen.  Provider: Briscoe Burns  No results found for this or any previous visit.  No results found for this or any previous visit.  No results found for this or any previous visit.  No results found for this or any previous visit.  No valid procedures specified. No results found for this or any previous visit.  No results found for this or any previous visit.  No results found for this or any previous visit (from the past 24 hour(s)).

## 2022-07-15 NOTE — Progress Notes (Signed)
post void residual =73m

## 2022-07-15 NOTE — Patient Instructions (Signed)
Take Gemtesa at night before bed

## 2022-07-16 LAB — URINALYSIS, ROUTINE W REFLEX MICROSCOPIC
Bilirubin, UA: NEGATIVE
Glucose, UA: NEGATIVE
Nitrite, UA: NEGATIVE
RBC, UA: NEGATIVE
Specific Gravity, UA: 1.015 (ref 1.005–1.030)
Urobilinogen, Ur: 0.2 mg/dL (ref 0.2–1.0)
pH, UA: 7 (ref 5.0–7.5)

## 2022-07-16 LAB — MICROSCOPIC EXAMINATION: RBC, Urine: NONE SEEN /hpf (ref 0–2)

## 2022-07-17 LAB — URINE CULTURE

## 2022-07-19 ENCOUNTER — Encounter: Payer: Self-pay | Admitting: Physician Assistant

## 2022-07-19 ENCOUNTER — Telehealth: Payer: Self-pay

## 2022-07-19 NOTE — Telephone Encounter (Signed)
Made patient aware that here urine cx showed mixed flora and a few colonies. Made patient aware to complete her Rx of antibx and to FU if her sxs persist after treatment. Patient voiced understanding.

## 2022-07-19 NOTE — Telephone Encounter (Signed)
-----   Message from [place], [place] sent at 07/19/2022  3:39 PM EDT ----- Please let pt know her urine cx grew mixed flora and only a few colonies. She needs to complete the Rx of antibx and FU for another UA if her sxs persist after treatment ----- Message ----- From: Interface, [facility] Lab Results In Sent: 07/16/2022   5:37 AM EDT To: Reynaldo Minium, PA-C

## 2022-08-20 ENCOUNTER — Ambulatory Visit (INDEPENDENT_AMBULATORY_CARE_PROVIDER_SITE_OTHER): Payer: Medicare HMO | Admitting: Urology

## 2022-08-20 VITALS — BP 134/71 | HR 69

## 2022-08-20 DIAGNOSIS — R351 Nocturia: Secondary | ICD-10-CM | POA: Diagnosis not present

## 2022-08-20 DIAGNOSIS — R32 Unspecified urinary incontinence: Secondary | ICD-10-CM

## 2022-08-20 LAB — URINALYSIS, ROUTINE W REFLEX MICROSCOPIC
Bilirubin, UA: NEGATIVE
Glucose, UA: NEGATIVE
Ketones, UA: NEGATIVE
Leukocytes,UA: NEGATIVE
Nitrite, UA: NEGATIVE
Protein,UA: NEGATIVE
RBC, UA: NEGATIVE
Specific Gravity, UA: 1.015 (ref 1.005–1.030)
Urobilinogen, Ur: 0.2 mg/dL (ref 0.2–1.0)
pH, UA: 6.5 (ref 5.0–7.5)

## 2022-08-20 LAB — BLADDER SCAN AMB NON-IMAGING: Scan Result: 7

## 2022-08-20 MED ORDER — TROSPIUM CHLORIDE 20 MG PO TABS: 20.0000 mg | ORAL_TABLET | Freq: Two times a day (BID) | ORAL | 11 refills | Status: DC

## 2022-08-20 MED ORDER — GEMTESA 75 MG PO TABS: 75.0000 mg | ORAL_TABLET | Freq: Every evening | ORAL | 0 refills | Status: DC

## 2022-08-20 NOTE — Progress Notes (Signed)
post void residual=7 

## 2022-08-20 NOTE — Progress Notes (Signed)
08/20/2022 11:45 AM   Katherine Joseph 28-Nov-1934 [id number]  Referring provider: Yves Dill, NP [place],  [place] [id number]  Followup nocturia and urinary incontinence  HPI: Katherine Joseph is a 87yo here for followup for nocturia and urge incontinence. She uses 3-4 pads per day. She has nocturia 2-3x per night. She is on sanctura and gemtesa. No other complaints today     PMH: Past Medical History:  Diagnosis Date   Arthritis    Fluttering muscles SEP 2009   MBS/BASW NL   GERD (gastroesophageal reflux disease)    Hemorrhoids, internal OCT 2011   Hereditary and idiopathic peripheral neuropathy 10/04/2016   Hyperlipidemia    Hypertension    IBS (irritable bowel syndrome)    Prediabetes     Surgical History: Past Surgical History:  Procedure Laterality Date   COLONOSCOPY  2004   RMR/NUR INFLAMMATORY POLYP, POST POLYPECTOMY BLEED   COLONOSCOPY  oct 2011 SLF nvd-WEIGHT LOSS   NL TI, [place]/DC TICS   ESOPHAGOGASTRODUODENOSCOPY  2006   W/DILATION W/RMR   KNEE ARTHROSCOPY     RIGHT FOOT SURGERY     TOTAL KNEE ARTHROPLASTY Left 05/21/2015   Procedure: TOTAL KNEE ARTHROPLASTY;  Surgeon: Carole Civil, MD;  Location: [facility];  Service: Orthopedics;  Laterality: Left;    Home Medications:  Allergies as of 08/20/2022       Reactions   Sulfa Antibiotics Hives   Iodinated Contrast Media Rash        Medication List        Accurate as of August 20, 2022 11:45 AM. If you have any questions, ask your nurse or doctor.          amLODipine 10 MG tablet Commonly known as: NORVASC Take 1 tablet (10 mg total) by mouth daily.   aspirin 81 MG tablet Take 81 mg by mouth every morning.   atorvastatin 20 MG tablet Commonly known as: LIPITOR Take 1 tablet (20 mg total) by mouth daily.   divalproex 250 MG 24 hr tablet Commonly known as: DEPAKOTE ER TAKE THREE (3) TABLETS BY MOUTH AT NIGHT   DULoxetine 60 MG capsule Commonly known  as: CYMBALTA TAKE ONE CAPSULE BY MOUTH DAILY   gabapentin 300 MG capsule Commonly known as: NEURONTIN TAKE 1 OR 2 CAPSULES BY MOUTH UP TO THREE TIMES DAILY FOR BURNING PAIN IN FEET.   Gemtesa 75 MG Tabs Generic drug: Vibegron Take 75 mg by mouth at bedtime.   levothyroxine 25 MCG tablet Commonly known as: SYNTHROID Take 25 mcg by mouth every morning.   metoprolol tartrate 25 MG tablet Commonly known as: LOPRESSOR Take 25 mg by mouth 2 (two) times daily.   omeprazole 20 MG capsule Commonly known as: PRILOSEC Take 1 capsule (20 mg total) by mouth daily.   potassium chloride SA 20 MEQ tablet Commonly known as: KLOR-CON M TAKE ONE TABLET BY MOUTH DAILY   torsemide 20 MG tablet Commonly known as: DEMADEX Take 1 tablet (20 mg total) by mouth as needed.   trospium 20 MG tablet Commonly known as: SANCTURA Take 1 tablet (20 mg total) by mouth 2 (two) times daily.        Allergies:  Allergies  Allergen Reactions   Sulfa Antibiotics Hives   Iodinated Contrast Media Rash    Family History: Family History  Problem Relation Age of Onset   Cancer Father        stomach   Alzheimer's disease Mother  Dementia Mother    Alcohol abuse Brother    Hypertension Other    Cancer Other    Colon polyps Neg Hx    Colon cancer Neg Hx     Social History:  reports that she has never smoked. Her smokeless tobacco use includes snuff. She reports that she does not drink alcohol and does not use drugs.  ROS: All other review of systems were reviewed and are negative except what is noted above in HPI  Physical Exam: BP 134/71   Pulse 69   Constitutional:  Alert and oriented, No acute distress. HEENT: [place] AT, moist mucus membranes.  Trachea midline, no masses. Cardiovascular: No clubbing, cyanosis, or edema. Respiratory: Normal respiratory effort, no increased work of breathing. GI: Abdomen is soft, nontender, nondistended, no abdominal masses GU: No CVA tenderness.  Lymph: No  cervical or inguinal lymphadenopathy. Skin: No rashes, bruises or suspicious lesions. Neurologic: Grossly intact, no focal deficits, moving all 4 extremities. Psychiatric: Normal mood and affect.  Laboratory Data: Lab Results  Component Value Date   WBC 8.5 01/24/2020   HGB 12.2 01/24/2020   HCT 39.8 01/24/2020   MCV 77.3 (L) 01/24/2020   PLT 202 01/24/2020    Lab Results  Component Value Date   CREATININE 1.24 (H) 02/24/2022    No results found for: "PSA"  No results found for: "TESTOSTERONE"  Lab Results  Component Value Date   HGBA1C 6.0 (H) 02/09/2021    Urinalysis    Component Value Date/Time   COLORURINE DARK YELLOW 02/21/2020 1136   APPEARANCEUR Cloudy (A) 07/15/2022 1142   LABSPEC 1.032 02/21/2020 1136   PHURINE 6.0 02/21/2020 1136   GLUCOSEU Negative 07/15/2022 1142   HGBUR NEGATIVE 02/21/2020 1136   BILIRUBINUR Negative 07/15/2022 1142   KETONESUR TRACE (A) 02/21/2020 1136   PROTEINUR Trace (A) 07/15/2022 1142   PROTEINUR TRACE (A) 02/21/2020 1136   NITRITE Negative 07/15/2022 1142   NITRITE NEGATIVE 05/14/2016 1945   LEUKOCYTESUR 1+ (A) 07/15/2022 1142    Lab Results  Component Value Date   LABMICR See below: 07/15/2022   WBCUA 6-10 (A) 07/15/2022   LABEPIT 0-10 07/15/2022   BACTERIA Many (A) 07/15/2022    Pertinent Imaging:  Results for orders placed during the hospital encounter of 09/24/08  DG Abd 1 View  Narrative Clinical Data: Left renal calculus, follow-up  ABDOMEN - 1 VIEW  Comparison: 03/27/2008  Findings: Diffuse bony demineralization. Levoconvex scoliosis lumbar spine. No definite left renal calculus identified. Small pelvic phleboliths stable. Slight prominent stool in proximal half of colon. Bowel gas pattern otherwise normal. No definite urinary tract calcification identified.  IMPRESSION: No definite urinary tract calcification seen.  Provider: Briscoe Burns  No results found for this or any previous  visit.  No results found for this or any previous visit.  No results found for this or any previous visit.  No results found for this or any previous visit.  No valid procedures specified. No results found for this or any previous visit.  No results found for this or any previous visit.   Assessment & Plan:    1. Urinary incontinence, unspecified type -contineu gemtesa and sanctura - Urinalysis, Routine w reflex microscopic - BLADDER SCAN AMB NON-IMAGING  2. Nocturia -continue gemtesa and sanctura   No follow-ups on file.  Nicolette Bang, MD  [facility] [place]

## 2022-08-31 ENCOUNTER — Encounter: Payer: Self-pay | Admitting: Urology

## 2022-08-31 NOTE — Patient Instructions (Signed)

## 2022-09-14 ENCOUNTER — Other Ambulatory Visit (HOSPITAL_COMMUNITY): Payer: Self-pay | Admitting: Adult Health

## 2022-09-14 DIAGNOSIS — Z1231 Encounter for screening mammogram for malignant neoplasm of breast: Secondary | ICD-10-CM

## 2022-09-27 ENCOUNTER — Ambulatory Visit (INDEPENDENT_AMBULATORY_CARE_PROVIDER_SITE_OTHER): Payer: Self-pay | Admitting: Gastroenterology

## 2022-09-27 ENCOUNTER — Encounter: Payer: Self-pay | Admitting: Gastroenterology

## 2022-09-27 VITALS — BP 132/79 | HR 66 | Temp 98.1°F | Ht 69.0 in | Wt 206.0 lb

## 2022-09-27 DIAGNOSIS — K219 Gastro-esophageal reflux disease without esophagitis: Secondary | ICD-10-CM

## 2022-09-27 DIAGNOSIS — K5904 Chronic idiopathic constipation: Secondary | ICD-10-CM

## 2022-09-27 NOTE — Progress Notes (Signed)
GI Office Note    Referring Provider: Ginny Forth* Primary Care Physician:  Yves Dill, NP  Primary Gastroenterologist: Elon Alas. Abbey Chatters, DO   Chief Complaint   Chief Complaint  Patient presents with   Follow-up    Doing well    History of Present Illness   Katherine Joseph is a 88 y.o. female presenting today for follow up of GERD, constipation. History of liver cysts stable for number of years on prior imaging (2011).   Feels well. BMs regular with use of Miralax. No melena, brbpr. No heartburn or indigestion. No abdominal pain. Appetite is good. No weight loss.   CT A/P with contrast 02/2022: IMPRESSION: 1. No acute abdominopelvic findings. 2. Benign appearing hepatic cysts. One cyst has peripheral calcification which could indicate infection. 3. Uterus is normal by CT imaging. 4. Large RIGHT adnexal cystic lesion. Cystic lesion is similar in size to comparison ultrasound 1 year prior (04/13/2021).  Medications   Current Outpatient Medications  Medication Sig Dispense Refill   amLODipine (NORVASC) 10 MG tablet Take 1 tablet (10 mg total) by mouth daily. 90 tablet 1   aspirin 81 MG tablet Take 81 mg by mouth every morning.      atorvastatin (LIPITOR) 20 MG tablet Take 1 tablet (20 mg total) by mouth daily. 90 tablet 1   divalproex (DEPAKOTE ER) 250 MG 24 hr tablet TAKE THREE (3) TABLETS BY MOUTH AT NIGHT 90 tablet 3   DULoxetine (CYMBALTA) 60 MG capsule TAKE ONE CAPSULE BY MOUTH DAILY 30 capsule 2   gabapentin (NEURONTIN) 300 MG capsule TAKE 1 OR 2 CAPSULES BY MOUTH UP TO THREE TIMES DAILY FOR BURNING PAIN IN FEET. 180 capsule 3   levothyroxine (SYNTHROID) 25 MCG tablet Take 25 mcg by mouth every morning.     metoprolol tartrate (LOPRESSOR) 25 MG tablet Take 25 mg by mouth 2 (two) times daily.     omeprazole (PRILOSEC) 20 MG capsule Take 1 capsule (20 mg total) by mouth daily. 90 capsule 1   polyethylene glycol (MIRALAX / GLYCOLAX) 17 g  packet Take 17 g by mouth daily.     potassium chloride SA (KLOR-CON) 20 MEQ tablet TAKE ONE TABLET BY MOUTH DAILY 30 tablet 3   torsemide (DEMADEX) 20 MG tablet Take 1 tablet (20 mg total) by mouth as needed. 30 tablet 3   trospium (SANCTURA) 20 MG tablet Take 1 tablet (20 mg total) by mouth 2 (two) times daily. 60 tablet 11   Vibegron (GEMTESA) 75 MG TABS Take 75 mg by mouth at bedtime. 28 tablet 0   No current facility-administered medications for this visit.    Allergies   Allergies as of 09/27/2022 - Review Complete 09/27/2022  Allergen Reaction Noted   Sulfa antibiotics Hives 08/29/2014   Iodinated contrast media Rash 01/13/2011      Review of Systems   General: Negative for anorexia, weight loss, fever, chills, fatigue, weakness. ENT: Negative for hoarseness, difficulty swallowing , nasal congestion. CV: Negative for chest pain, angina, palpitations, dyspnea on exertion, peripheral edema.  Respiratory: Negative for dyspnea at rest, dyspnea on exertion, cough, sputum, wheezing.  GI: See history of present illness. GU:  Negative for dysuria, hematuria, urinary incontinence, urinary frequency, nocturnal urination.  Endo: Negative for unusual weight change.     Physical Exam   BP (!) 145/78 (BP Location: Right Arm, Patient Position: Sitting, Cuff Size: Large)   Pulse 66   Temp 98.1 F (36.7 C) (Oral)  Ht '5\' 9"'$  (1.753 m)   Wt 206 lb (93.4 kg)   SpO2 96%   BMI 30.42 kg/m    General: Well-nourished, well-developed in no acute distress.  Eyes: No icterus. Mouth: Oropharyngeal mucosa moist and pink   Abdomen: Bowel sounds are normal, nontender, nondistended, no hepatosplenomegaly or masses,  no abdominal bruits or hernia , no rebound or guarding.  Rectal: not performed Extremities: No lower extremity edema. No clubbing or deformities. Neuro: Alert and oriented x 4   Skin: Warm and dry, no jaundice.   Psych: Alert and cooperative, normal mood and affect.  Labs   Lab  Results  Component Value Date   CREATININE 1.24 (H) 02/24/2022   BUN 18 02/24/2022   NA 139 02/24/2022   K 4.5 02/24/2022   CL 102 02/24/2022   CO2 24 02/24/2022   Lab Results  Component Value Date   ALT 17 02/24/2022   AST 20 02/24/2022   ALKPHOS 70 02/24/2022   BILITOT <0.2 02/24/2022     Imaging Studies   No results found.  Assessment   GERD: doing well  Constipation: doing well    PLAN   Continue omeprazole '20mg'$  daily. Continue miralax daily.  Return ov in one year or sooner if needed.    Laureen Ochs. Bobby Rumpf, [place], [facility]

## 2022-09-27 NOTE — Patient Instructions (Signed)
Continue omeprazole '20mg'$  daily for acid reflux. Continue Miralax daily for constipation. Return office visit in one year or sooner if needed.

## 2022-10-01 ENCOUNTER — Ambulatory Visit (HOSPITAL_COMMUNITY)
Admission: RE | Admit: 2022-10-01 | Discharge: 2022-10-01 | Disposition: A | Discharge status: Home / Self Care | Payer: Medicare HMO | Source: Ambulatory Visit | Attending: Adult Health | Admitting: Adult Health

## 2022-10-01 DIAGNOSIS — Z1231 Encounter for screening mammogram for malignant neoplasm of breast: Secondary | ICD-10-CM | POA: Diagnosis present

## 2022-11-18 ENCOUNTER — Encounter: Payer: Self-pay | Admitting: Radiology

## 2023-01-20 DIAGNOSIS — G40909 Epilepsy, unspecified, not intractable, without status epilepticus: Secondary | ICD-10-CM | POA: Insufficient documentation

## 2023-01-20 DIAGNOSIS — G629 Polyneuropathy, unspecified: Secondary | ICD-10-CM | POA: Insufficient documentation

## 2023-01-20 DIAGNOSIS — N1831 Chronic kidney disease, stage 3a: Secondary | ICD-10-CM | POA: Insufficient documentation

## 2023-02-23 ENCOUNTER — Ambulatory Visit: Payer: Medicare HMO | Admitting: Urology

## 2023-03-02 ENCOUNTER — Ambulatory Visit: Payer: Medicare HMO | Admitting: Urology

## 2023-03-02 VITALS — BP 144/77 | HR 66 | Ht 69.0 in | Wt 206.0 lb

## 2023-03-02 DIAGNOSIS — R32 Unspecified urinary incontinence: Secondary | ICD-10-CM | POA: Diagnosis not present

## 2023-03-02 LAB — URINALYSIS, ROUTINE W REFLEX MICROSCOPIC
Bilirubin, UA: NEGATIVE
Glucose, UA: NEGATIVE
Leukocytes,UA: NEGATIVE
Nitrite, UA: NEGATIVE
Protein,UA: NEGATIVE
RBC, UA: NEGATIVE
Specific Gravity, UA: 1.015 (ref 1.005–1.030)
Urobilinogen, Ur: 1 mg/dL (ref 0.2–1.0)
pH, UA: 6.5 (ref 5.0–7.5)

## 2023-03-02 MED ORDER — TROSPIUM CHLORIDE ER 60 MG PO CP24: 1.0000 | ORAL_CAPSULE | Freq: Every day | ORAL | 11 refills | Status: DC

## 2023-03-02 NOTE — Progress Notes (Signed)
03/02/2023 11:26 AM   Katherine Joseph [birth year] [id number]  Referring provider: Kara Pacer, NP [address] [place],  [place] [address]  Followup OAB   HPI: Katherine Joseph is a 88yo here for followup for OAB. SAhe is on trospium 20-mg BID. She has intermittent urge incontinence with incontinent events 2-3x per day. She uses 4-5 pads per day. Urine stream strong. No straining to urinate. NO history of UTI.    PMH: Past Medical History:  Diagnosis Date   Arthritis    Fluttering muscles SEP 2009   MBS/BASW NL   GERD (gastroesophageal reflux disease)    Hemorrhoids, internal OCT 2011   Hereditary and idiopathic peripheral neuropathy 10/04/2016   Hyperlipidemia    Hypertension    IBS (irritable bowel syndrome)    Prediabetes     Surgical History: Past Surgical History:  Procedure Laterality Date   COLONOSCOPY  2004   RMR/NUR INFLAMMATORY POLYP, POST POLYPECTOMY BLEED   COLONOSCOPY  oct 2011 [facility] nvd-WEIGHT LOSS   NL TI, [place]/DC TICS   ESOPHAGOGASTRODUODENOSCOPY  2006   W/DILATION W/RMR   KNEE ARTHROSCOPY     RIGHT FOOT SURGERY     TOTAL KNEE ARTHROPLASTY Left 05/21/2015   Procedure: TOTAL KNEE ARTHROPLASTY;  Surgeon: Vickki Hearing, MD;  Location: [facility];  Service: Orthopedics;  Laterality: Left;    Home Medications:  Allergies as of 03/02/2023       Reactions   Sulfa Antibiotics Hives   Iodinated Contrast Media Rash        Medication List        Accurate as of March 02, 2023 11:26 AM. If you have any questions, ask your nurse or doctor.          STOP taking these medications    trospium 20 MG tablet Commonly known as: SANCTURA Replaced by: Trospium Chloride 60 MG Cp24       TAKE these medications    amLODipine 10 MG tablet Commonly known as: NORVASC Take 1 tablet (10 mg total) by mouth daily.   aspirin 81 MG tablet Take 81 mg by mouth every morning.   atorvastatin 20 MG tablet Commonly known as: LIPITOR Take 1  tablet (20 mg total) by mouth daily.   divalproex 250 MG 24 hr tablet Commonly known as: DEPAKOTE ER TAKE THREE (3) TABLETS BY MOUTH AT NIGHT   DULoxetine 60 MG capsule Commonly known as: CYMBALTA TAKE ONE CAPSULE BY MOUTH DAILY   gabapentin 300 MG capsule Commonly known as: NEURONTIN TAKE 1 OR 2 CAPSULES BY MOUTH UP TO THREE TIMES DAILY FOR BURNING PAIN IN FEET.   Gemtesa 75 MG Tabs Generic drug: Vibegron Take 75 mg by mouth at bedtime.   levothyroxine 25 MCG tablet Commonly known as: SYNTHROID Take 25 mcg by mouth every morning.   metoprolol tartrate 25 MG tablet Commonly known as: LOPRESSOR Take 25 mg by mouth 2 (two) times daily.   omeprazole 20 MG capsule Commonly known as: PRILOSEC Take 1 capsule (20 mg total) by mouth daily.   polyethylene glycol 17 g packet Commonly known as: MIRALAX / GLYCOLAX Take 17 g by mouth daily.   potassium chloride SA 20 MEQ tablet Commonly known as: KLOR-CON M TAKE ONE TABLET BY MOUTH DAILY   torsemide 20 MG tablet Commonly known as: DEMADEX Take 1 tablet (20 mg total) by mouth as needed.   Trospium Chloride 60 MG Cp24 Take 1 capsule (60 mg total) by mouth daily. Replaces: trospium  20 MG tablet        Allergies:  Allergies  Allergen Reactions   Sulfa Antibiotics Hives   Iodinated Contrast Media Rash    Family History: Family History  Problem Relation Age of Onset   Cancer Father        stomach   Alzheimer's disease Mother    Dementia Mother    Alcohol abuse Brother    Hypertension Other    Cancer Other    Colon polyps Neg Hx    Colon cancer Neg Hx     Social History:  reports that she has never smoked. Her smokeless tobacco use includes snuff. She reports that she does not drink alcohol and does not use drugs.  ROS: All other review of systems were reviewed and are negative except what is noted above in HPI  Physical Exam: BP (!) 144/77   Pulse 66   Ht 5\' 9"  (1.753 m)   Wt 206 lb (93.4 kg)   BMI 30.42  kg/m   Constitutional:  Alert and oriented, No acute distress. HEENT: [place] AT, moist mucus membranes.  Trachea midline, no masses. Cardiovascular: No clubbing, cyanosis, or edema. Respiratory: Normal respiratory effort, no increased work of breathing. GI: Abdomen is soft, nontender, nondistended, no abdominal masses GU: No CVA tenderness.  Lymph: No cervical or inguinal lymphadenopathy. Skin: No rashes, bruises or suspicious lesions. Neurologic: Grossly intact, no focal deficits, moving all 4 extremities. Psychiatric: Normal mood and affect.  Laboratory Data: Lab Results  Component Value Date   WBC 8.5 01/24/2020   HGB 12.2 01/24/2020   HCT 39.8 01/24/2020   MCV 77.3 (L) 01/24/2020   PLT 202 01/24/2020    Lab Results  Component Value Date   CREATININE 1.24 (H) 02/24/2022    No results found for: "PSA"  No results found for: "TESTOSTERONE"  Lab Results  Component Value Date   HGBA1C 6.0 (H) 02/09/2021    Urinalysis    Component Value Date/Time   COLORURINE DARK YELLOW 02/21/2020 1136   APPEARANCEUR Clear 08/20/2022 1128   LABSPEC 1.032 02/21/2020 1136   PHURINE 6.0 02/21/2020 1136   GLUCOSEU Negative 08/20/2022 1128   HGBUR NEGATIVE 02/21/2020 1136   BILIRUBINUR Negative 08/20/2022 1128   KETONESUR TRACE (A) 02/21/2020 1136   PROTEINUR Negative 08/20/2022 1128   PROTEINUR TRACE (A) 02/21/2020 1136   NITRITE Negative 08/20/2022 1128   NITRITE NEGATIVE 05/14/2016 1945   LEUKOCYTESUR Negative 08/20/2022 1128    Lab Results  Component Value Date   LABMICR Comment 08/20/2022   WBCUA 6-10 (A) 07/15/2022   LABEPIT 0-10 07/15/2022   BACTERIA Many (A) 07/15/2022    Pertinent Imaging:  Results for orders placed during the hospital encounter of 09/24/08  DG Abd 1 View  Narrative Clinical Data: Left renal calculus, follow-up  ABDOMEN - 1 VIEW  Comparison: 03/27/2008  Findings: Diffuse bony demineralization. Levoconvex scoliosis lumbar spine. No definite  left renal calculus identified. Small pelvic phleboliths stable. Slight prominent stool in proximal half of colon. Bowel gas pattern otherwise normal. No definite urinary tract calcification identified.  IMPRESSION: No definite urinary tract calcification seen.  Provider: Kyra Searles  No results found for this or any previous visit.  No results found for this or any previous visit.  No results found for this or any previous visit.  No results found for this or any previous visit.  No valid procedures specified. No results found for this or any previous visit.  No results found for this or any  previous visit.   Assessment & Plan:    1. Urinary incontinence, unspecified type Increase sanctura to 60mg  daily - Urinalysis, Routine w reflex microscopic   No follow-ups on file.  Wilkie Aye, MD  [facility] [place]

## 2023-03-08 ENCOUNTER — Encounter: Payer: Self-pay | Admitting: Urology

## 2023-03-08 NOTE — Patient Instructions (Signed)

## 2023-06-07 IMAGING — MG MM DIGITAL SCREENING BILAT W/ TOMO AND CAD
6 of 10 series · 6 of 30 positions shown · non-contrast
Comparison: Previous exam(s).

CLINICAL DATA: Screening.

EXAM:
DIGITAL SCREENING BILATERAL MAMMOGRAM WITH TOMOSYNTHESIS AND CAD
TECHNIQUE: Bilateral screening digital craniocaudal and mediolateral oblique
mammograms were obtained. Bilateral screening digital breast
tomosynthesis was performed. The images were evaluated with
computer-aided detection.

[R MLO synth-2D (1 of 2)]
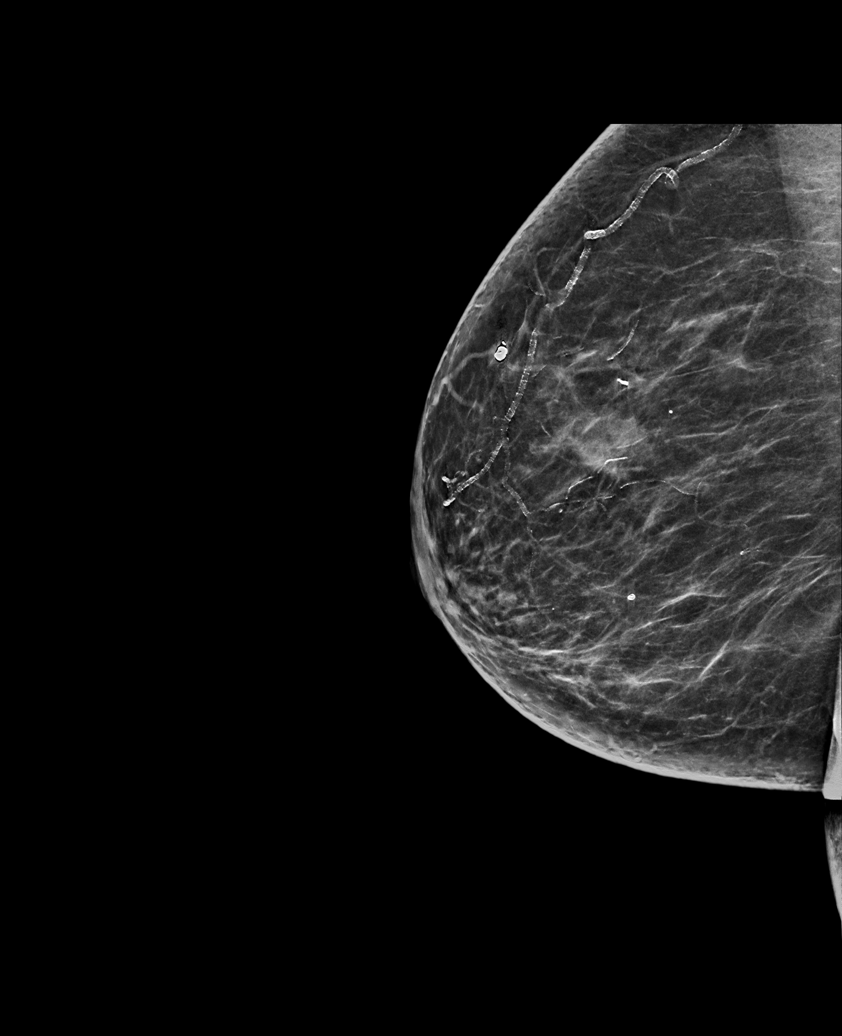

[R MLO synth-2D (2 of 2)]
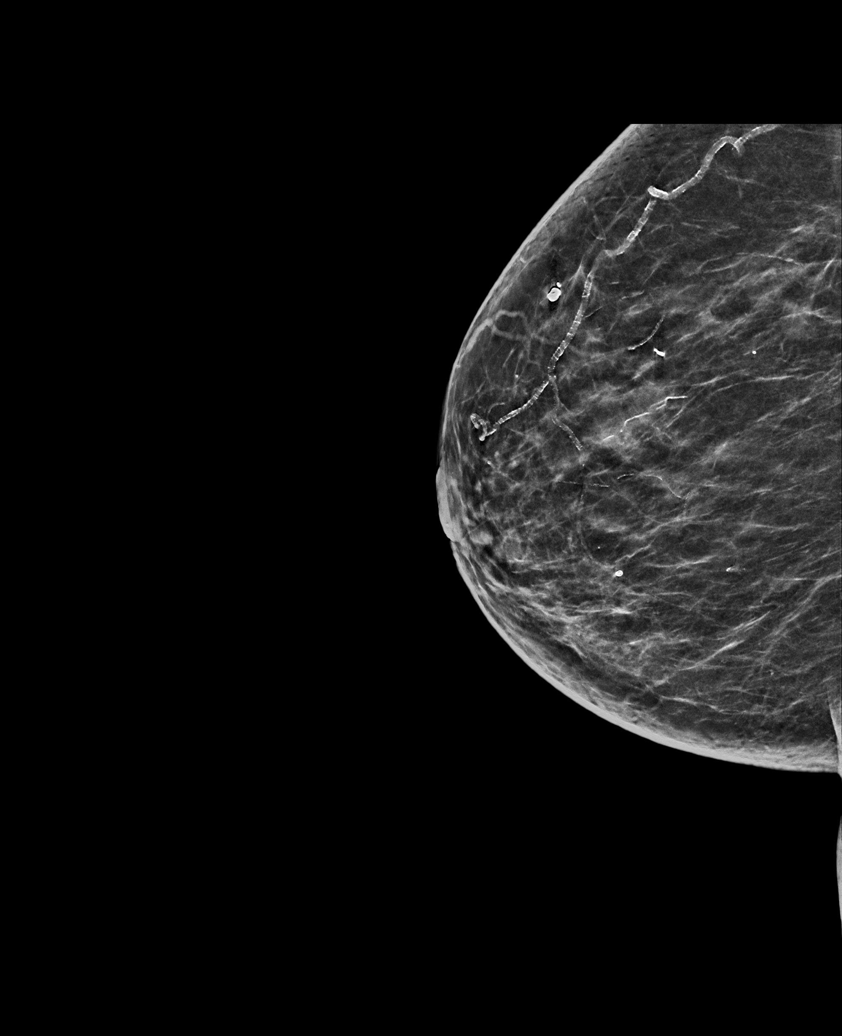

[R CC synth-2D]
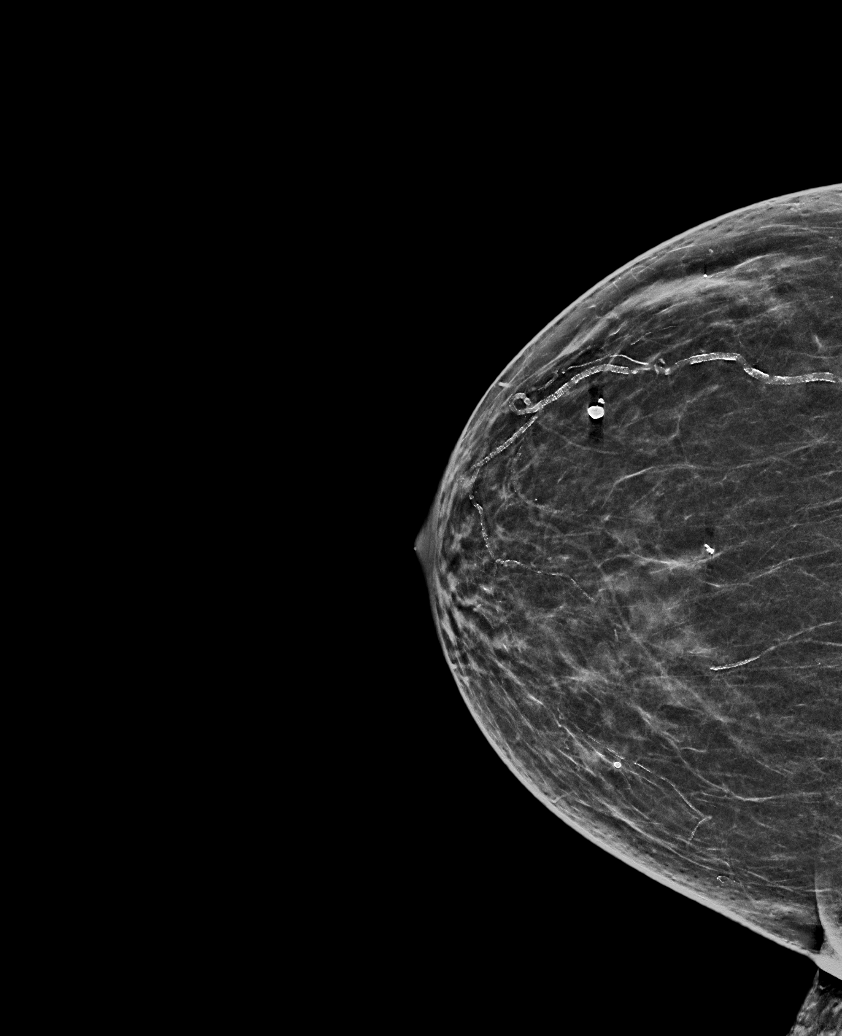

[L CC synth-2D]
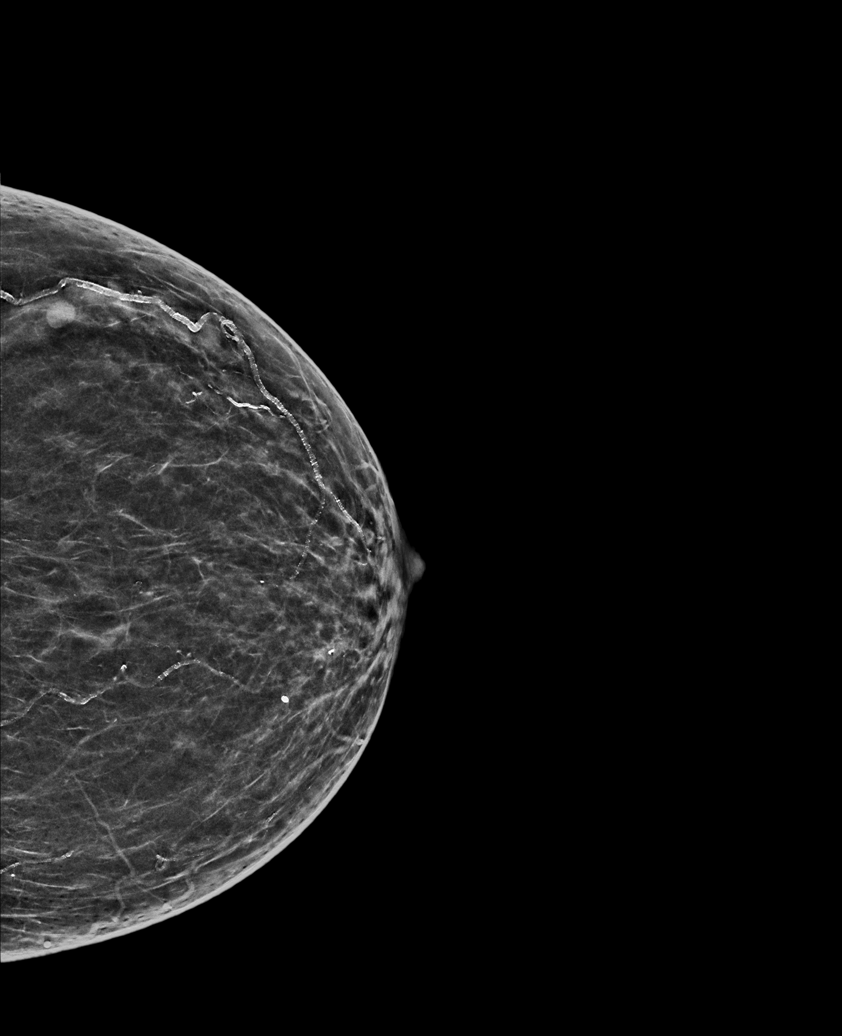

[L MLO synth-2D]
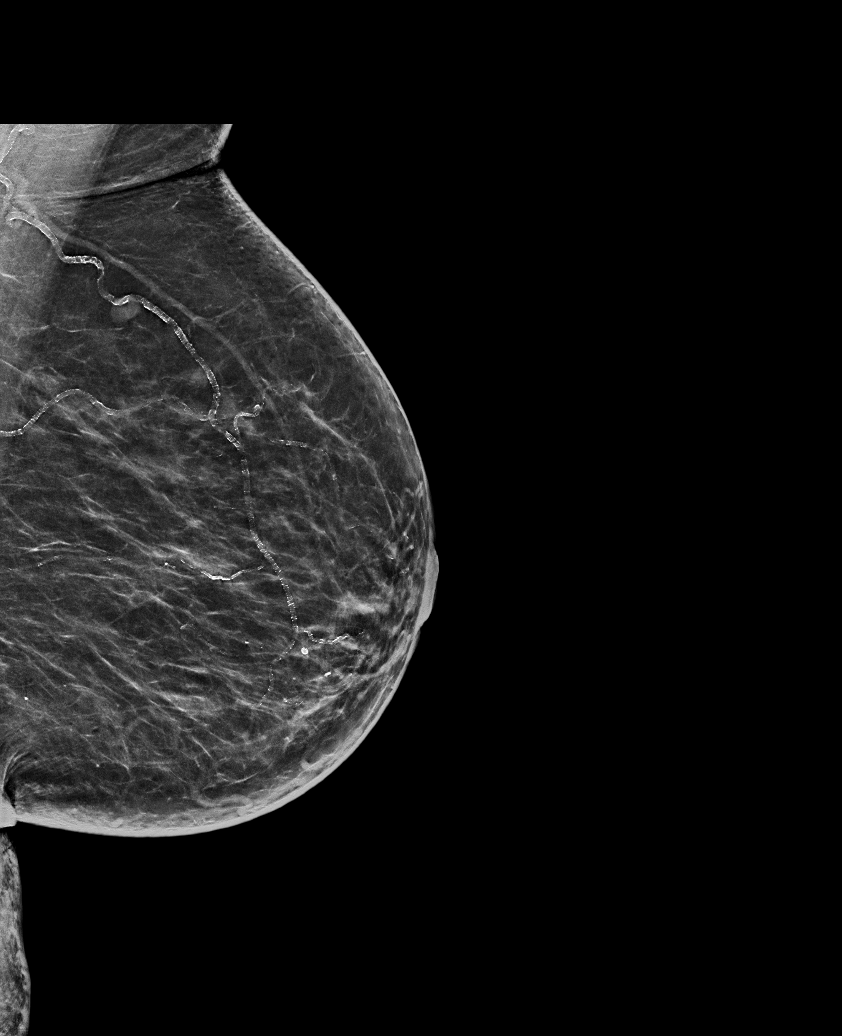

[R MLO tomo · tomo slice 33/65.0]
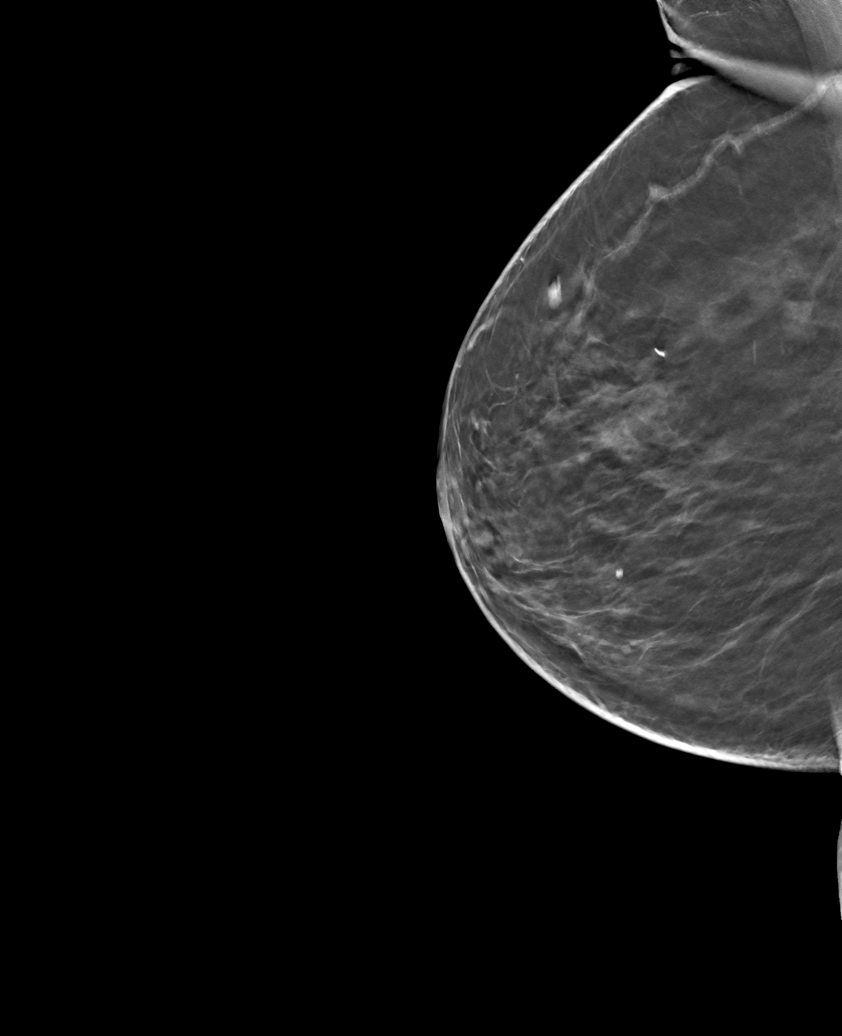

[6 of 30 positions shown; findings below may reference images not displayed]

ACR Breast Density Category b: There are scattered areas of
fibroglandular density.
FINDINGS: There are no findings suspicious for malignancy.
IMPRESSION: No mammographic evidence of malignancy. A result letter of this
screening mammogram will be mailed directly to the patient.

RECOMMENDATION:
Screening mammogram in one year. (Code:51-O-LD2)

BI-RADS CATEGORY  1: Negative.

## 2023-06-27 ENCOUNTER — Other Ambulatory Visit (INDEPENDENT_AMBULATORY_CARE_PROVIDER_SITE_OTHER): Payer: Medicare HMO

## 2023-06-27 ENCOUNTER — Other Ambulatory Visit: Payer: Self-pay

## 2023-06-27 ENCOUNTER — Encounter: Payer: Self-pay | Admitting: Orthopedic Surgery

## 2023-06-27 ENCOUNTER — Ambulatory Visit: Payer: Medicare HMO | Admitting: Orthopedic Surgery

## 2023-06-27 DIAGNOSIS — M1711 Unilateral primary osteoarthritis, right knee: Secondary | ICD-10-CM

## 2023-06-27 DIAGNOSIS — G8929 Other chronic pain: Secondary | ICD-10-CM

## 2023-06-27 DIAGNOSIS — M1710 Unilateral primary osteoarthritis, unspecified knee: Secondary | ICD-10-CM

## 2023-06-27 DIAGNOSIS — Z96652 Presence of left artificial knee joint: Secondary | ICD-10-CM

## 2023-06-27 DIAGNOSIS — M25561 Pain in right knee: Secondary | ICD-10-CM | POA: Diagnosis not present

## 2023-06-27 MED ORDER — METHYLPREDNISOLONE ACETATE 40 MG/ML IJ SUSP
40.0000 mg | Freq: Once | INTRAMUSCULAR | Status: AC
  Administered 2023-06-27: 40 mg via INTRA_ARTICULAR

## 2023-06-27 NOTE — Patient Instructions (Signed)
Joint Steroid Injection A joint steroid injection is a procedure to relieve swelling and pain in a joint. Steroids are medicines that reduce inflammation. In this procedure, your health care provider uses a syringe and a needle to inject a steroid medicine into a painful and inflamed joint. A pain-relieving medicine (anesthetic) may be injected along with the steroid. In some cases, your health care provider may use an imaging technique such as ultrasound or fluoroscopy to guide the injection. Joints that are often treated with steroid injections include the knee, shoulder, hip, and spine. These injections may also be used in the elbow, ankle, and joints of the hands or feet. You may have joint steroid injections as part of your treatment for inflammation caused by: Gout. Rheumatoid arthritis. Advanced wear-and-tear arthritis (osteoarthritis). Tendinitis. Bursitis. Joint steroid injections may be repeated, but having them too often can damage a joint or the skin over the joint. You should not have joint steroid injections less than 6 weeks apart or more than four times a year. Tell a health care provider about: Any allergies you have. All medicines you are taking, including vitamins, herbs, eye drops, creams, and over-the-counter medicines. Any problems you or family members have had with anesthetic medicines. Any blood disorders you have. Any surgeries you have had. Any medical conditions you have. Whether you are pregnant or may be pregnant. What are the risks? Generally, this is a safe treatment. However, problems may occur, including: Infection. Bleeding. Allergic reactions to medicines. Damage to the joint or tissues around the joint. Thinning of skin or loss of skin color over the joint. Temporary flushing of the face or chest. Temporary increase in pain. Temporary increase in blood sugar. Failure to relieve inflammation or pain. What happens before the treatment? Medicines Ask  your health care provider about: Changing or stopping your regular medicines. This is especially important if you are taking diabetes medicines or blood thinners. Taking medicines such as aspirin and ibuprofen. These medicines can thin your blood. Do not take these medicines unless your health care provider tells you to take them. Taking over-the-counter medicines, vitamins, herbs, and supplements. General instructions You may have imaging tests of your joint. Ask your health care provider if you can drive yourself home after the procedure. What happens during the treatment?  Your health care provider will position you for the injection and locate the injection site over your joint. The skin over the joint will be cleaned with a germ-killing soap. Your health care provider may: Spray a numbing solution (topical anesthetic) over the injection site. Inject a local anesthetic under the skin above your joint. The needle will be placed through your skin into your joint. Your health care provider may use imaging to guide the needle to the right spot for the injection. If imaging is used, a special contrast dye may be injected to confirm that the needle is in the correct location. The steroid medicine will be injected into your joint. Anesthetic may be injected along with the steroid. This may be a medicine that relieves pain for a short time (short-acting anesthetic) or for a longer time (long-acting anesthetic). The needle will be removed, and an adhesive bandage (dressing) will be placed over the injection site. The procedure may vary among health care providers and hospitals. What can I expect after the treatment? You will be able to go home after the treatment. It is normal to feel slight flushing for a few days after the injection. After the treatment, it is  common to have an increase in joint pain after the anesthetic has worn off. This may happen about an hour after a short-acting anesthetic  or about 8 hours after a longer-acting anesthetic. You should begin to feel relief from joint pain and swelling after 24 to 48 hours. Contact your health care provider if you do not begin to feel relief after 2 days. Follow these instructions at home: Injection site care Leave the adhesive dressing over your injection site in place until your health care provider says you can remove it. Check your injection site every day for signs of infection. Check for: More redness, swelling, or pain. Fluid or blood. Warmth. Pus or a bad smell. Activity Return to your normal activities as told by your health care provider. Ask your health care provider what activities are safe for you. You may be asked to limit activities that put stress on the joint for a few days. Do joint exercises as told by your health care provider. Do not take baths, swim, or use a hot tub until your health care provider approves. Ask your health care provider if you may take showers. You may only be allowed to take sponge baths. Managing pain, stiffness, and swelling  If directed, put ice on the joint. To do this: Put ice in a plastic bag. Place a towel between your skin and the bag. Leave the ice on for 20 minutes, 2-3 times a day. Remove the ice if your skin turns bright red. This is very important. If you cannot feel pain, heat, or cold, you have a greater risk of damage to the area. Raise (elevate) your joint above the level of your heart when you are sitting or lying down. General instructions Take over-the-counter and prescription medicines only as told by your health care provider. Do not use any products that contain nicotine or tobacco, such as cigarettes, e-cigarettes, and chewing tobacco. These can delay joint healing. If you need help quitting, ask your health care provider. If you have diabetes, be aware that your blood sugar may be slightly elevated for several days after the injection. Keep all follow-up visits.  This is important. Contact a health care provider if you have: Chills or a fever. Any signs of infection at your injection site. Increased pain or swelling or no relief after 2 days. Summary A joint steroid injection is a treatment to relieve pain and swelling in a joint. Steroids are medicines that reduce inflammation. Your health care provider may add an anesthetic along with the steroid. You may have joint steroid injections as part of your arthritis treatment. Joint steroid injections may be repeated, but having them too often can damage a joint or the skin over the joint. Contact your health care provider if you have a fever, chills, or signs of infection, or if you get no relief from joint pain or swelling. This information is not intended to replace advice given to you by your health care provider. Make sure you discuss any questions you have with your health care provider. Document Revised: 02/15/2020 Document Reviewed: 02/15/2020 Elsevier Patient Education  2024 [employer].

## 2023-06-27 NOTE — Progress Notes (Signed)
Office Visit Note   Patient: Katherine Joseph           Date of Birth: 11/07/1934           MRN: [id number] Visit Date: 06/27/2023 Requested by: Kara Pacer, NP [address] [place],  [place] [address] PCP: Kara Pacer, NP  Annual follow-up for left total knee arthroplasty  Chief Complaint  Patient presents with   Knee Pain    Right/ states left one okay s/p replaced 05/21/15   The patient has a left total knee is doing well functioning well at 8 years no complaints  She also has right knee arthritis status post injection does not want to have surgery complains of right lateral knee pain  Imaging of the left knee shows a stable posterior stabilized Sigma fixed-bearing total knee but no signs of loosening alignment looks good as well  On the right she has valgus osteoarthritis grade 3  She does not want surgery we discussed it again.  She is scheduled for injection of the right knee  Procedure note right knee injection   verbal consent was obtained to inject right knee joint  Timeout was completed to confirm the site of injection  The medications used were depomedrol 40 mg and 1% lidocaine 3 cc Anesthesia was provided by ethyl chloride and the skin was prepped with alcohol.  After cleaning the skin with alcohol a 20-gauge needle was used to inject the right knee joint. There were no complications. A sterile bandage was applied.    Encounter Diagnoses  Name Primary?   Arthritis of knee Yes   Status post total left knee replacement    Chronic pain of right knee    Primary osteoarthritis of right knee     Return as needed

## 2023-08-03 ENCOUNTER — Telehealth: Payer: Self-pay | Admitting: *Deleted

## 2023-08-03 ENCOUNTER — Encounter: Payer: Self-pay | Admitting: Gastroenterology

## 2023-08-03 ENCOUNTER — Ambulatory Visit: Payer: Medicare HMO | Admitting: Gastroenterology

## 2023-08-03 VITALS — BP 135/82 | HR 61 | Temp 98.2°F | Ht 69.0 in | Wt 200.6 lb

## 2023-08-03 DIAGNOSIS — R1084 Generalized abdominal pain: Secondary | ICD-10-CM | POA: Diagnosis not present

## 2023-08-03 DIAGNOSIS — N83201 Unspecified ovarian cyst, right side: Secondary | ICD-10-CM

## 2023-08-03 DIAGNOSIS — R1010 Upper abdominal pain, unspecified: Secondary | ICD-10-CM

## 2023-08-03 DIAGNOSIS — K219 Gastro-esophageal reflux disease without esophagitis: Secondary | ICD-10-CM

## 2023-08-03 DIAGNOSIS — K7689 Other specified diseases of liver: Secondary | ICD-10-CM

## 2023-08-03 MED ORDER — DIPHENHYDRAMINE HCL 50 MG PO TABS: ORAL_TABLET | ORAL | 0 refills | Status: AC

## 2023-08-03 MED ORDER — PREDNISONE 50 MG PO TABS: ORAL_TABLET | ORAL | 0 refills | Status: DC

## 2023-08-03 NOTE — Patient Instructions (Signed)
Continue omeprazole 20 mg daily before breakfast. Continue MiraLAX 17 g daily. Complete labs at [facility] at your convenience. CT scan to be scheduled.  Because you have a prior history of rash to contrast, we will give you prednisone and Benadryl to try and prevent allergic reaction.  You will need to take prednisone 1 tablet 13 hours before your CT scan, 1 tablet 7 hours before your CT scan, 1 tablet 1 hour before your CT scan.  You will need to take Benadryl 50 mg 1 hour before your CT scan.  I have sent in prescriptions to your pharmacy.

## 2023-08-03 NOTE — Telephone Encounter (Signed)
[employer] PA: Authorization Number: [id number] Case Number: [id number] Review Date: 08/03/2023 4:09:59 PM Expiration Date: 01/30/2024 Status: Your case has been Approved.

## 2023-08-03 NOTE — Progress Notes (Signed)
GI Office Note    Referring Provider: Katherine Basset* Primary Care Physician:  Kara Pacer, NP  Primary Gastroenterologist:Charles K. Marletta Lor, DO   Chief Complaint   Chief Complaint  Patient presents with   [facility]    States that her stomach swells and then starts to come up and burn. Currently takes omeprazole 20mg  once daily.    History of Present Illness   Katherine Joseph is a 88 y.o. female presenting today for follow up. Last seen 09/2022. H/o GERD, constipation. History of liver cysts dating back number of years.   For the past month, she has been having episodes of having sensation of discomfort that starts in lower abdomen and feels like she swells, and then has something come up in her esophagus and has burning pain. Symptoms associated with getting sweaty, heart races, feels shaky all over. Denies chest pain or shortness of breath. Symptoms nonexertional.  Generally will sit down and drink metamucil and symptoms finally pass, but "lasts for awhile". States she is having regular daily BMs with metamucil. No melena, brbpr. No dysphagia. No n/v.    Labs Labs from September 2024: White blood cell count 7800, hemoglobin 13.1, MCV 77, platelets 240,000, creatinine 1.08, albumin 3.9, total bilirubin 0.3, alk phos 68, AST 17, ALT 13, A1c 6.5, TSH 2.84,  CT A/P with contrast 02/2022: IMPRESSION: 1. No acute abdominopelvic findings. 2. Benign appearing hepatic cysts. One cyst has peripheral calcification which could indicate infection. 3. Uterus is normal by CT imaging. 4. Large RIGHT adnexal cystic lesion. Cystic lesion is similar in size to comparison ultrasound 1 year prior (04/13/2021).  EGD and ileocolonoscopy in October 2011 -Normal TI, approximately 10 cm visualized -Rare descending and sigmoid colon diverticula -Small internal hemorrhoids -Normal esophagus -Patchy erythema with occasional erosions seen in the stomach, benign biopsy, no H.  pylori -Multiple small duodenal ulcers   Medications   Current Outpatient Medications  Medication Sig Dispense Refill   amLODipine (NORVASC) 10 MG tablet Take 1 tablet (10 mg total) by mouth daily. 90 tablet 1   aspirin 81 MG tablet Take 81 mg by mouth every morning.      atorvastatin (LIPITOR) 20 MG tablet Take 1 tablet (20 mg total) by mouth daily. 90 tablet 1   divalproex (DEPAKOTE ER) 250 MG 24 hr tablet TAKE THREE (3) TABLETS BY MOUTH AT NIGHT 90 tablet 3   DULoxetine (CYMBALTA) 60 MG capsule TAKE ONE CAPSULE BY MOUTH DAILY 30 capsule 2   gabapentin (NEURONTIN) 300 MG capsule TAKE 1 OR 2 CAPSULES BY MOUTH UP TO THREE TIMES DAILY FOR BURNING PAIN IN FEET. 180 capsule 3   levothyroxine (SYNTHROID) 25 MCG tablet Take 25 mcg by mouth every morning.     metoprolol tartrate (LOPRESSOR) 25 MG tablet Take 25 mg by mouth 2 (two) times daily.     omeprazole (PRILOSEC) 20 MG capsule Take 1 capsule (20 mg total) by mouth daily. 90 capsule 1   polyethylene glycol (MIRALAX / GLYCOLAX) 17 g packet Take 17 g by mouth daily.     Trospium Chloride 60 MG CP24 Take 1 capsule (60 mg total) by mouth daily. 30 capsule 11   No current facility-administered medications for this visit.    Allergies   Allergies as of 08/03/2023 - Review Complete 08/03/2023  Allergen Reaction Noted   Sulfa antibiotics Hives 08/29/2014   Iodinated contrast media Rash 01/13/2011      Review of Systems   General: Negative for  anorexia, weight loss, fever, chills, fatigue, weakness. ENT: Negative for hoarseness, difficulty swallowing , nasal congestion. CV: Negative for chest pain, angina, palpitations, dyspnea on exertion, peripheral edema. See hpi Respiratory: Negative for dyspnea at rest, dyspnea on exertion, cough, sputum, wheezing.  GI: See history of present illness. GU:  Negative for dysuria, hematuria, urinary incontinence, urinary frequency, nocturnal urination.  Endo: Negative for unusual weight change.      Physical Exam   BP 135/82 (BP Location: Right Arm, Patient Position: Sitting, Cuff Size: Large)   Pulse 61   Temp 98.2 F (36.8 C) (Oral)   Ht 5\' 9"  (1.753 m)   Wt 200 lb 9.6 oz (91 kg)   SpO2 95%   BMI 29.62 kg/m    General: Well-nourished, well-developed in no acute distress.  Eyes: No icterus. Mouth: Oropharyngeal mucosa moist and pink  Lungs: Clear to auscultation bilaterally.  Heart: Regular rate and rhythm, no murmurs rubs or gallops.  Abdomen: Bowel sounds are normal, nondistended, no hepatosplenomegaly or masses,  no abdominal bruits or hernia , no rebound or guarding. Mild generalized tenderness, worse in upper abdomen. Rectal: not performed  Extremities: No lower extremity edema. No clubbing or deformities. Neuro: Alert and oriented x 4   Skin: Warm and dry, no jaundice.   Psych: Alert and cooperative, normal mood and affect.  Labs   See hpi  Imaging Studies   No results found.  Assessment/Plan:   Generalized abdominal pain/upper abdominal pain -etiology unclear -CT A/P with contrast planned. Will premedicate with benadryl/prednisone given reported rash with IV contrast -obtain labs -continue miralax 17g daily -continue omeprazole 20mg  daily -if CT negative, she may require EGD.  GERD -unclear if symptoms are refractory gerd or other -continue omeprazole 20mg  daily  -CT planned  Hepatic cysts -multiple cysts, one measuring 2.3cm on CT 02/2022 with peripheral calcifications  Ovarian cyst, right: -has been evaluated by gyn recommending report pelvic pain or acute change at which time U/S would be considered.  -otherwise no further work up warranted     [employer]. Melvyn Neth, MHS, PA-C [facility]

## 2023-08-03 NOTE — Telephone Encounter (Signed)
Pt informed that CT is scheduled for Wednesday 08/10/23, arrive at 9:15 am to check in and will need to take medication that was sent to pharmacy prior to having the Ct done. Verbalized understanding.

## 2023-08-09 LAB — CBC WITH DIFFERENTIAL/PLATELET
Basophils Absolute: 0 10*3/uL (ref 0.0–0.2)
Basos: 0 %
EOS (ABSOLUTE): 0.2 10*3/uL (ref 0.0–0.4)
Eos: 2 %
Hematocrit: 42 % (ref 34.0–46.6)
Hemoglobin: 12.8 g/dL (ref 11.1–15.9)
Immature Grans (Abs): 0 10*3/uL (ref 0.0–0.1)
Immature Granulocytes: 0 %
Lymphocytes Absolute: 3.4 10*3/uL — ABNORMAL HIGH (ref 0.7–3.1)
Lymphs: 43 %
MCH: 23.7 pg — ABNORMAL LOW (ref 26.6–33.0)
MCHC: 30.5 g/dL — ABNORMAL LOW (ref 31.5–35.7)
MCV: 78 fL — ABNORMAL LOW (ref 79–97)
Monocytes Absolute: 0.6 10*3/uL (ref 0.1–0.9)
Monocytes: 7 %
Neutrophils Absolute: 3.8 10*3/uL (ref 1.4–7.0)
Neutrophils: 48 %
Platelets: 154 10*3/uL (ref 150–450)
RBC: 5.4 x10E6/uL — ABNORMAL HIGH (ref 3.77–5.28)
RDW: 15.2 % (ref 11.7–15.4)
WBC: 8.1 10*3/uL (ref 3.4–10.8)

## 2023-08-09 LAB — COMPREHENSIVE METABOLIC PANEL
ALT: 14 [IU]/L (ref 0–32)
AST: 18 [IU]/L (ref 0–40)
Albumin: 3.8 g/dL (ref 3.7–4.7)
Alkaline Phosphatase: 66 [IU]/L (ref 44–121)
BUN/Creatinine Ratio: 15 (ref 12–28)
BUN: 17 mg/dL (ref 8–27)
Bilirubin Total: 0.3 mg/dL (ref 0.0–1.2)
CO2: 26 mmol/L (ref 20–29)
Calcium: 9.1 mg/dL (ref 8.7–10.3)
Chloride: 102 mmol/L (ref 96–106)
Creatinine, Ser: 1.12 mg/dL — ABNORMAL HIGH (ref 0.57–1.00)
Globulin, Total: 3.1 g/dL (ref 1.5–4.5)
Glucose: 97 mg/dL (ref 70–99)
Potassium: 4.2 mmol/L (ref 3.5–5.2)
Sodium: 141 mmol/L (ref 134–144)
Total Protein: 6.9 g/dL (ref 6.0–8.5)
eGFR: 48 mL/min/{1.73_m2} — ABNORMAL LOW (ref >59)

## 2023-08-09 LAB — LIPASE: Lipase: 21 U/L (ref 14–85)

## 2023-08-10 ENCOUNTER — Encounter (HOSPITAL_COMMUNITY): Payer: Self-pay

## 2023-08-10 ENCOUNTER — Ambulatory Visit (HOSPITAL_COMMUNITY): Admission: RE | Admit: 2023-08-10 | Payer: Medicare HMO | Source: Ambulatory Visit

## 2023-08-11 ENCOUNTER — Other Ambulatory Visit: Payer: Self-pay

## 2023-08-11 DIAGNOSIS — D509 Iron deficiency anemia, unspecified: Secondary | ICD-10-CM

## 2023-08-12 LAB — IRON,TIBC AND FERRITIN PANEL
Ferritin: 115 ng/mL (ref 15–150)
Iron Saturation: 44 % (ref 15–55)
Iron: 93 ug/dL (ref 27–139)
Total Iron Binding Capacity: 211 ug/dL — ABNORMAL LOW (ref 250–450)
UIBC: 118 ug/dL (ref 118–369)

## 2023-08-12 LAB — SPECIMEN STATUS REPORT

## 2023-08-16 ENCOUNTER — Ambulatory Visit (HOSPITAL_COMMUNITY): Admission: RE | Admit: 2023-08-16 | Payer: Medicare HMO | Source: Ambulatory Visit

## 2023-08-19 ENCOUNTER — Ambulatory Visit (HOSPITAL_COMMUNITY)
Admission: RE | Admit: 2023-08-19 | Discharge: 2023-08-19 | Disposition: A | Discharge status: Home / Self Care | Payer: Medicare HMO | Source: Ambulatory Visit | Attending: Gastroenterology | Admitting: Gastroenterology

## 2023-08-19 DIAGNOSIS — K7689 Other specified diseases of liver: Secondary | ICD-10-CM | POA: Diagnosis present

## 2023-08-19 DIAGNOSIS — N83201 Unspecified ovarian cyst, right side: Secondary | ICD-10-CM | POA: Insufficient documentation

## 2023-08-19 DIAGNOSIS — K219 Gastro-esophageal reflux disease without esophagitis: Secondary | ICD-10-CM | POA: Insufficient documentation

## 2023-08-19 DIAGNOSIS — R1084 Generalized abdominal pain: Secondary | ICD-10-CM | POA: Diagnosis present

## 2023-08-19 MED ORDER — IOHEXOL 300 MG/ML  SOLN
100.0000 mL | Freq: Once | INTRAMUSCULAR | Status: AC | PRN
  Administered 2023-08-19: 100 mL via INTRAVENOUS

## 2023-08-26 ENCOUNTER — Ambulatory Visit: Payer: Medicare HMO | Admitting: Urology

## 2023-09-05 ENCOUNTER — Other Ambulatory Visit (HOSPITAL_COMMUNITY): Payer: Self-pay | Admitting: Adult Health

## 2023-09-05 DIAGNOSIS — Z1231 Encounter for screening mammogram for malignant neoplasm of breast: Secondary | ICD-10-CM

## 2023-09-06 ENCOUNTER — Other Ambulatory Visit: Payer: Self-pay | Admitting: Gastroenterology

## 2023-09-06 DIAGNOSIS — N3090 Cystitis, unspecified without hematuria: Secondary | ICD-10-CM

## 2023-09-06 NOTE — Progress Notes (Signed)
Please let patient know her CT scan shows the right adnexal cyst (in her pelvis) has grown since 2018, went from 4.8X3.2cm to 6.7X5.0cm. however, compared to 02/2022 CT there has been very slight change in size from 6.6X4.5cm to 6.7X5.0cm. I'm not sure if this is cause of her lower abdominal discomfort. Per radiology, they are advising she see her GYN and consider pelvis MRI w/o and w/contrast.   CT shows stable liver cysts, benign.  Her bladder wall is thickened, could be due to bladder infection.   -->check u/a with reflex to culture -->arrange follow up ov.

## 2023-09-10 LAB — UA/M W/RFLX CULTURE, ROUTINE
Glucose, UA: NEGATIVE
Nitrite, UA: NEGATIVE
RBC, UA: NEGATIVE
Specific Gravity, UA: >=1.03 — AB (ref 1.005–1.030)
Urobilinogen, Ur: 0.2 mg/dL (ref 0.2–1.0)
pH, UA: 5.5 (ref 5.0–7.5)

## 2023-09-10 LAB — URINE CULTURE, REFLEX

## 2023-09-10 LAB — MICROSCOPIC EXAMINATION
Casts: NONE SEEN /[LPF]
Epithelial Cells (non renal): >10 /[HPF] — AB (ref 0–10)
RBC, Urine: NONE SEEN /[HPF] (ref 0–2)

## 2023-09-15 ENCOUNTER — Other Ambulatory Visit: Payer: Self-pay | Admitting: *Deleted

## 2023-09-15 DIAGNOSIS — N3090 Cystitis, unspecified without hematuria: Secondary | ICD-10-CM

## 2023-09-15 DIAGNOSIS — N949 Unspecified condition associated with female genital organs and menstrual cycle: Secondary | ICD-10-CM

## 2023-09-27 ENCOUNTER — Ambulatory Visit: Payer: Medicare HMO | Admitting: Gastroenterology

## 2023-10-03 ENCOUNTER — Encounter (HOSPITAL_COMMUNITY): Payer: Self-pay

## 2023-10-03 ENCOUNTER — Ambulatory Visit (HOSPITAL_COMMUNITY)
Admission: RE | Admit: 2023-10-03 | Discharge: 2023-10-03 | Disposition: A | Discharge status: Home / Self Care | Payer: Medicare HMO | Source: Ambulatory Visit | Attending: Adult Health | Admitting: Adult Health

## 2023-10-03 DIAGNOSIS — Z1231 Encounter for screening mammogram for malignant neoplasm of breast: Secondary | ICD-10-CM | POA: Insufficient documentation

## 2023-10-05 ENCOUNTER — Encounter: Payer: Self-pay | Admitting: Gastroenterology

## 2023-10-05 ENCOUNTER — Ambulatory Visit (INDEPENDENT_AMBULATORY_CARE_PROVIDER_SITE_OTHER): Payer: Medicare HMO | Admitting: Gastroenterology

## 2023-10-05 VITALS — BP 129/70 | HR 69 | Temp 98.2°F | Ht 69.0 in | Wt 196.0 lb

## 2023-10-05 DIAGNOSIS — N83291 Other ovarian cyst, right side: Secondary | ICD-10-CM | POA: Diagnosis not present

## 2023-10-05 DIAGNOSIS — K5900 Constipation, unspecified: Secondary | ICD-10-CM | POA: Diagnosis not present

## 2023-10-05 DIAGNOSIS — K219 Gastro-esophageal reflux disease without esophagitis: Secondary | ICD-10-CM | POA: Diagnosis not present

## 2023-10-05 DIAGNOSIS — K7689 Other specified diseases of liver: Secondary | ICD-10-CM | POA: Diagnosis not present

## 2023-10-05 NOTE — Patient Instructions (Signed)
 Try cutting back on your omeprazole  to one every other day before breakfast. If you start having frequent heartburn or stomach pain, then go back to once daily. If you tolerate taking every other day for a few weeks, then you can try stopping all together.  Return to the office in one year or call sooner if you need anything.

## 2023-10-05 NOTE — Progress Notes (Signed)
 GI Office Note    Referring Provider: Nsumanganyi, [place] Ce* Primary Care Physician:  Nsumanganyi, [place] Cesar, NP  Primary Gastroenterologist: Rolando Cliche. Mordechai April, DO   Chief Complaint   Chief Complaint  Patient presents with   Follow-up    Doing well, no issues    History of Present Illness   Katherine Joseph is a 88 y.o. female presenting today for follow up. Last seen 07/2022. H/O GERD, constipation, liver cysts.   Since last ov, completed CT as outlined below. U/A suggestive of poor clean catch, urine culture unremarkable. Recommended to see her gyn about right adenxal cyst due to CT findings.   Labs from September 2024: White blood cell count 7800, hemoglobin 13.1, MCV 77, platelets 240,000, creatinine 1.08, albumin 3.9, total bilirubin 0.3, alk phos 68, AST 17, ALT 13, A1c 6.5, TSH 2.84,   CT A/P with contrast 07/2023: IMPRESSION: -6.7 cm benign-appearing right adnexal cyst, which shows gradual increase in size since 2018, and is suspicious for a low-grade cystic ovarian neoplasm. Recommend GYN consult, and consider pelvis MRI w/o and w/ contrast if clinically warranted. Reference: JACR 2020 Feb; 17(2):248-254 -Increased mild diffuse bladder wall thickening, which may be due to cystitis or neurogenic bladder. Suggest correlation with urinalysis. -Colonic diverticulosis, without radiographic evidence of diverticulitis.  -Multiple small hepatic cysts are again seen. No suspicious hepatic masses identified.   CT A/P with contrast 02/2022: IMPRESSION: 1. No acute abdominopelvic findings. 2. Benign appearing hepatic cysts. One cyst has peripheral calcification which could indicate infection. 3. Uterus is normal by CT imaging. 4. Large RIGHT adnexal cystic lesion. Cystic lesion is similar in size to comparison ultrasound 1 year prior (04/13/2021).   EGD and ileocolonoscopy in October 2011 -Normal TI, approximately 10 cm visualized -Rare descending and sigmoid  colon diverticula -Small internal hemorrhoids -Normal esophagus -Patchy erythema with occasional erosions seen in the stomach, benign biopsy, no H. pylori -Multiple small duodenal ulcers   Today: doing well. No complaints. No abdominal pain. Uses miralax  only when needed. Has been on omeprazole  for over a decade. Discussed potentially trying to wean off. No dysphagia. No heartburn at this time. Sees gyn tomorrow for slow growing right adnexal cysts.   Medications   Current Outpatient Medications  Medication Sig Dispense Refill   amLODipine  (NORVASC ) 10 MG tablet Take 1 tablet (10 mg total) by mouth daily. 90 tablet 1   aspirin  81 MG tablet Take 81 mg by mouth every morning.      atorvastatin  (LIPITOR) 20 MG tablet Take 1 tablet (20 mg total) by mouth daily. 90 tablet 1   diphenhydrAMINE  (BENADRYL ) 50 MG tablet Take one tablet 1 hour before scheduled CT to reduce risk of allergic reaction. 30 tablet 0   divalproex  (DEPAKOTE  ER) 250 MG 24 hr tablet TAKE THREE (3) TABLETS BY MOUTH AT NIGHT 90 tablet 3   DULoxetine  (CYMBALTA ) 60 MG capsule TAKE ONE CAPSULE BY MOUTH DAILY 30 capsule 2   gabapentin  (NEURONTIN ) 300 MG capsule TAKE 1 OR 2 CAPSULES BY MOUTH UP TO THREE TIMES DAILY FOR BURNING PAIN IN FEET. 180 capsule 3   levothyroxine (SYNTHROID) 25 MCG tablet Take 25 mcg by mouth every morning.     metoprolol  tartrate (LOPRESSOR ) 25 MG tablet Take 25 mg by mouth 2 (two) times daily.     omeprazole  (PRILOSEC) 20 MG capsule Take 1 capsule (20 mg total) by mouth daily. 90 capsule 1   polyethylene glycol (MIRALAX  / GLYCOLAX ) 17 g packet Take 17  g by mouth daily.     Trospium  Chloride 60 MG CP24 Take 1 capsule (60 mg total) by mouth daily. 30 capsule 11   No current facility-administered medications for this visit.    Allergies   Allergies as of 10/05/2023 - Review Complete 10/05/2023  Allergen Reaction Noted   Sulfa antibiotics Hives 08/29/2014   Iodinated contrast media Rash 01/13/2011       Review of Systems   General: Negative for anorexia, weight loss, fever, chills, fatigue, weakness. ENT: Negative for hoarseness, difficulty swallowing, nasal congestion. CV: Negative for chest pain, angina, palpitations, dyspnea on exertion, peripheral edema.  Respiratory: Negative for dyspnea at rest, dyspnea on exertion, cough, sputum, wheezing.  GI: See history of present illness. GU:  Negative for dysuria, hematuria, urinary incontinence, urinary frequency, nocturnal urination.  Endo: Negative for unusual weight change.     Physical Exam   BP 129/70 (BP Location: Left Arm, Patient Position: Sitting, Cuff Size: Large)   Pulse 69   Temp 98.2 F (36.8 C) (Oral)   Ht 5\' 9"  (1.753 m)   Wt 196 lb (88.9 kg)   SpO2 97%   BMI 28.94 kg/m    General: Well-nourished, well-developed in no acute distress.  Eyes: No icterus. Mouth: Oropharyngeal mucosa moist and pink , no lesions erythema or exudate.  Abdomen: Bowel sounds are normal, nontender, nondistended, no hepatosplenomegaly or masses,  no abdominal bruits or hernia , no rebound or guarding.  Rectal: not performed  Extremities: No lower extremity edema. No clubbing or deformities. Neuro: Alert and oriented x 4   Skin: Warm and dry, no jaundice.   Psych: Alert and cooperative, normal mood and affect.  Labs   Lab Results  Component Value Date   NA 141 08/08/2023   CL 102 08/08/2023   K 4.2 08/08/2023   CO2 26 08/08/2023   BUN 17 08/08/2023   CREATININE 1.12 (H) 08/08/2023   EGFR 48 (L) 08/08/2023   CALCIUM  9.1 08/08/2023   ALBUMIN 3.8 08/08/2023   GLUCOSE 97 08/08/2023   Lab Results  Component Value Date   ALT 14 08/08/2023   AST 18 08/08/2023   ALKPHOS 66 08/08/2023   BILITOT 0.3 08/08/2023   Lab Results  Component Value Date   WBC 8.1 08/08/2023   HGB 12.8 08/08/2023   HCT 42.0 08/08/2023   MCV 78 (L) 08/08/2023   PLT 154 08/08/2023   Lab Results  Component Value Date   IRON 93 08/08/2023   TIBC 211 (L)  08/08/2023   FERRITIN 115 08/08/2023   Lab Results  Component Value Date   LIPASE 21 08/08/2023    Imaging Studies   MM 3D SCREENING MAMMOGRAM BILATERAL BREAST Result Date: 10/04/2023 CLINICAL DATA:  Screening. EXAM: DIGITAL SCREENING BILATERAL MAMMOGRAM WITH TOMOSYNTHESIS AND CAD TECHNIQUE: Bilateral screening digital craniocaudal and mediolateral oblique mammograms were obtained. Bilateral screening digital breast tomosynthesis was performed. The images were evaluated with computer-aided detection. Best images possible per technologist communication. COMPARISON:  Previous exam(s). ACR Breast Density Category b: There are scattered areas of fibroglandular density. FINDINGS: There are no findings suspicious for malignancy. IMPRESSION: No mammographic evidence of malignancy. A result letter of this screening mammogram will be mailed directly to the patient. RECOMMENDATION: Screening mammogram in one year. (Code:SM-B-01Y) BI-RADS CATEGORY  1: Negative. Electronically Signed   By: Clancy Crimes M.D.   On: 10/04/2023 12:30    Assessment/Plan:   GERD: doing well at this time, we discussed potentially trying to wean off medications, she  has history of GERD/dyspepsia and has been on PPI for over a decade. These symptoms have been well managed but unclear if PPI still needed. If she has recurrent symptoms trying to wean off PPI, then she will resume daily usage.  Hepatic cysts: asymptomatic, no further imaging needed.  Constipation: doing well on miralax , prn.  Right adnexal cyst: has seen gyn in the past with decision not to pursue invasive procedures. Recently seen again during CT for abdominal pain and per radiologist, concern for low-grade cystic ovarian neoplasm given slow gradual increase in size over the past six years. Has follow up with gyn tomorrow.   Return ov in one year or sooner if needed.   Trudie Fuse. Harles Lied, MHS, PA-C [facility]

## 2023-10-06 ENCOUNTER — Encounter: Payer: Self-pay | Admitting: Obstetrics & Gynecology

## 2023-10-06 ENCOUNTER — Ambulatory Visit: Payer: Medicare HMO | Admitting: Obstetrics & Gynecology

## 2023-10-06 VITALS — BP 139/73 | HR 86 | Ht 68.0 in | Wt 193.0 lb

## 2023-10-06 DIAGNOSIS — Z780 Asymptomatic menopausal state: Secondary | ICD-10-CM | POA: Diagnosis not present

## 2023-10-06 DIAGNOSIS — N83201 Unspecified ovarian cyst, right side: Secondary | ICD-10-CM | POA: Diagnosis not present

## 2023-10-06 NOTE — Progress Notes (Signed)
GYN VISIT Patient name: Katherine Joseph MRN [id number]  Date of birth: 1935/02/13 Chief Complaint:   Follow-up  History of Present Illness:   Katherine Joseph is a 89 y.o. G72P9 Pm female being seen today for per GI recommendations:  Ovarian cyst: In review, patient is well-known to me for a right ovarian cyst that has been present since 2018.  Her last ultrasound was completed in 2022.     [place] 03/2021: Simple right ovarian cyst measuring 5.9 x 4.3 x 4.8 cm. While there was some increase in size the cyst appeared benign and patient was asymptomatic.  The plan was for follow-up should she become symptomatic.  Patient was seen November 2024 by PCP where she had noted some vague lower abdominal discomfort.  At that time a CT was completed that showed a slight increase in size of the right adnexal cyst.  She is here today for follow-up regarding the CT results.  Today she denies any abdominal or pelvic pain.  She denies urinary concerns.  Denies vaginal bleeding, discharge or irritation.  She denies abdominal bloating, decreased appetite or any acute symptoms   No LMP recorded. Patient is postmenopausal.    Review of Systems:   Pertinent items are noted in HPI Denies fever/chills, dizziness, headaches, visual disturbances, fatigue, shortness of breath, chest pain, abdominal pain, vomiting, no problems with bowel movements, urination, or intercourse unless otherwise stated above.  Pertinent History Reviewed:   Past Surgical History:  Procedure Laterality Date   COLONOSCOPY  2004   RMR/NUR INFLAMMATORY POLYP, POST POLYPECTOMY BLEED   COLONOSCOPY  oct 2011 SLF nvd-WEIGHT LOSS   NL TI, [place]/DC TICS   ESOPHAGOGASTRODUODENOSCOPY  2006   W/DILATION W/RMR   KNEE ARTHROSCOPY     RIGHT FOOT SURGERY     TOTAL KNEE ARTHROPLASTY Left 05/21/2015   Procedure: TOTAL KNEE ARTHROPLASTY;  Surgeon: Vickki Hearing, MD;  Location: [facility];  Service: Orthopedics;  Laterality: Left;    Past Medical  History:  Diagnosis Date   Arthritis    Fluttering muscles SEP 2009   MBS/BASW NL   GERD (gastroesophageal reflux disease)    Hemorrhoids, internal OCT 2011   Hereditary and idiopathic peripheral neuropathy 10/04/2016   Hyperlipidemia    Hypertension    IBS (irritable bowel syndrome)    Prediabetes    Reviewed problem list, medications and allergies. Physical Assessment:   Vitals:   10/06/23 1003  BP: 139/73  Pulse: 86  Weight: 193 lb (87.5 kg)  Height: 5\' 8"  (1.727 m)  Body mass index is 29.35 kg/m.       Physical Examination:   General appearance: alert, well appearing, and in no distress  Psych: mood appropriate, normal affect  Skin: warm & dry   Cardiovascular: normal heart rate noted  Respiratory: normal respiratory effort, no distress  Abdomen: soft, non-tender, no rebound, no guarding  Pelvic: examination not indicated  Extremities: no edema   Chaperone: N/A    Assessment & Plan:  1) Right ovarian cyst -reviewed recent CT- per radiology concern about low grade neoplasm quoting JACR. -will plan for follow up US for better characteristics of cyst as it still appears to be simple in appearance -pt remains asymptomatic -further management pending [place]  Orders Placed This Encounter  Procedures   US PELVIC COMPLETE WITH TRANSVAGINAL    Return for [place] next available.   Myna Hidalgo, DO Attending Obstetrician & Gynecologist, [facility] for [employer], [facility]

## 2023-10-18 ENCOUNTER — Ambulatory Visit: Payer: Medicare HMO | Admitting: Obstetrics & Gynecology

## 2023-10-18 ENCOUNTER — Other Ambulatory Visit (HOSPITAL_COMMUNITY)
Admission: RE | Admit: 2023-10-18 | Discharge: 2023-10-18 | Disposition: A | Discharge status: Home / Self Care | Payer: Medicare HMO | Source: Ambulatory Visit | Attending: Obstetrics & Gynecology | Admitting: Obstetrics & Gynecology

## 2023-10-18 ENCOUNTER — Encounter: Payer: Self-pay | Admitting: Obstetrics & Gynecology

## 2023-10-18 ENCOUNTER — Ambulatory Visit: Payer: Medicare HMO

## 2023-10-18 VITALS — BP 139/74 | HR 80 | Ht 68.0 in | Wt 193.0 lb

## 2023-10-18 DIAGNOSIS — Z780 Asymptomatic menopausal state: Secondary | ICD-10-CM

## 2023-10-18 DIAGNOSIS — N83201 Unspecified ovarian cyst, right side: Secondary | ICD-10-CM | POA: Diagnosis not present

## 2023-10-18 DIAGNOSIS — R9389 Abnormal findings on diagnostic imaging of other specified body structures: Secondary | ICD-10-CM | POA: Diagnosis not present

## 2023-10-18 NOTE — Progress Notes (Signed)
GYN VISIT Patient name: Katherine Joseph MRN [id number]  Date of birth: June 10, 1935 Chief Complaint:   Follow-up ([place] today)  History of Present Illness:   Katherine Joseph is a 89 y.o. G7P0 PM  female being seen today for follow up regarding:  -Ovarian cyst: This has been present since 2018, last [place] 2022.  Pt seen by GI recently and cyst was seen on CT with slight increase in size.  She presents today for f/u imaging.  In review:  03/2021: Simple right ovarian cyst measuring 5.9 x 4.3 x 4.8 cm. While there was some increase in size the cyst appeared benign and patient was asymptomatic.  She continues to be asymptomatic- denies pelvic or abdominal pain.  Denies bloating or fullness.  Denies vaginal bleeding.  -Thickened endometrium: This has been known since 2018- again pt asymptomatic- denies vaginal bleeding, discharge or irritation.  Prior US reviewed- in 2018 endometrium was 11mm- EMB completed and found to be negative.  In 2022- endometrium 14mm, today endometrium measuring 15mm.  No LMP recorded. Patient is postmenopausal.  Review of Systems:   Pertinent items are noted in HPI Denies fever/chills, dizziness, headaches, visual disturbances, fatigue, shortness of breath, chest pain, abdominal pain, vomiting. Pertinent History Reviewed:   Past Surgical History:  Procedure Laterality Date   COLONOSCOPY  2004   RMR/NUR INFLAMMATORY POLYP, POST POLYPECTOMY BLEED   COLONOSCOPY  oct 2011 SLF nvd-WEIGHT LOSS   NL TI, [place]/DC TICS   ESOPHAGOGASTRODUODENOSCOPY  2006   W/DILATION W/RMR   KNEE ARTHROSCOPY     RIGHT FOOT SURGERY     TOTAL KNEE ARTHROPLASTY Left 05/21/2015   Procedure: TOTAL KNEE ARTHROPLASTY;  Surgeon: Vickki Hearing, MD;  Location: [facility];  Service: Orthopedics;  Laterality: Left;    Past Medical History:  Diagnosis Date   Arthritis    Fluttering muscles SEP 2009   MBS/BASW NL   GERD (gastroesophageal reflux disease)    Hemorrhoids, internal OCT 2011   Hereditary  and idiopathic peripheral neuropathy 10/04/2016   Hyperlipidemia    Hypertension    IBS (irritable bowel syndrome)    Prediabetes    Reviewed problem list, medications and allergies. Physical Assessment:   Vitals:   10/18/23 1213  BP: 139/74  Pulse: 80  Weight: 193 lb (87.5 kg)  Height: 5\' 8"  (1.727 m)  Body mass index is 29.35 kg/m.       Physical Examination:   General appearance: alert, well appearing, and in no distress  Psych: mood appropriate, normal affect  Skin: warm & dry   Cardiovascular: normal heart rate noted  Respiratory: normal respiratory effort, no distress  Abdomen: soft, non-tender  Pelvic: VULVA: normal appearing vulva with no masses, tenderness or lesions, VAGINA: normal appearing vagina with normal color and discharge, no lesions, CERVIX: normal appearing cervix without discharge or lesions  Extremities: no edema   Chaperone: Faith Rogue    Endometrial Biopsy Procedure Note  Pre-operative Diagnosis: thickened endometrium  Post-operative Diagnosis: same  Procedure Details  The risks (including infection, bleeding, pain, and uterine perforation) and benefits of the procedure were explained to the patient and Written informed consent was obtained.   The patient was placed in the dorsal lithotomy position.  Bimanual exam showed the uterus to be in the neutral position.  A speculum inserted in the vagina, and the cervix prepped with betadine.     A single tooth tenaculum was applied to the anterior lip of the cervix for stabilization.  Os finder and  small dilators were used.  A Pipelle endometrial aspirator was used to sample the endometrium.  Sample was sent for pathologic examination.  Condition: Stable  Complications: None   Assessment & Plan:  1) Right ovarian cyst -Reviewed today's ultrasound, cyst does appear to have enlarged in size from prior imaging in 2022 -Cyst appears simple in nature and likely to represent a benign finding;  however, in light of increasing size there is not good census on management -due to age and other co-morbidities surgical intervention is not ideal -given that pt is asymptomatic and the cyst is likely benign in nature with only slow increase in size will plan for continued surveillance -f/u US in 12mos []  should pt become symptomatic, cyst >10cm or other acute changes on [place] would reconsider surgical intervention  2) Thickened endometrium -pt asymptomatic and suspect incidental finding -again in light of slow but increase in thickness repeat EMB completed today -further management pending pathology   Return in about 1 year (around 10/17/2024) for yearly visit/pelvic US.   Myna Hidalgo, DO Attending Obstetrician & Gynecologist, [facility] for [employer], [facility]

## 2023-10-18 NOTE — Progress Notes (Signed)
PELVIC US TA/TV: heterogeneous anteverted uterus,avascular complex thickened endometrium 15.3 mm,simple unilocular right adnexal cyst 7.1 x 4.9 x 5.8 cm,normal left ovary (limited view),no free fluid,no pain during ultrasound  Chaperone Kelia

## 2023-10-19 LAB — SURGICAL PATHOLOGY

## 2023-10-21 ENCOUNTER — Other Ambulatory Visit (HOSPITAL_COMMUNITY): Payer: Self-pay | Admitting: Adult Health

## 2023-10-21 DIAGNOSIS — Z13820 Encounter for screening for osteoporosis: Secondary | ICD-10-CM

## 2023-10-26 ENCOUNTER — Ambulatory Visit: Payer: Medicare HMO | Admitting: Urology

## 2023-10-26 ENCOUNTER — Encounter: Payer: Self-pay | Admitting: Urology

## 2023-10-26 VITALS — BP 114/67 | HR 62

## 2023-10-26 DIAGNOSIS — R32 Unspecified urinary incontinence: Secondary | ICD-10-CM

## 2023-10-26 MED ORDER — TROSPIUM CHLORIDE ER 60 MG PO CP24: 1.0000 | ORAL_CAPSULE | Freq: Every day | ORAL | 11 refills | Status: DC

## 2023-10-26 NOTE — Progress Notes (Signed)
 10/26/2023 4:20 PM   Katherine Joseph [birth year] [id number]  Referring provider: Benjamin Raina Elizabeth, NP [address] [place] Katherine Joseph [place],  [place] [address]  Followup urge incontinence   HPI: Ms Katherine Joseph is a [phone] here for followup for urge incontinence and nocturia. She is on sanctura  60mg  daily. She notes no daytime urinary frequency and no urge incontinence. She notes her nocturia is unchanged. She has nocturia 3-4x which are large volume. She has lower extremity edema and wears compression stockings. UA is normal.    PMH: Past Medical History:  Diagnosis Date   Arthritis    Fluttering muscles SEP 2009   MBS/BASW NL   GERD (gastroesophageal reflux disease)    Hemorrhoids, internal OCT 2011   Hereditary and idiopathic peripheral neuropathy 10/04/2016   Hyperlipidemia    Hypertension    IBS (irritable bowel syndrome)    Prediabetes     Surgical History: Past Surgical History:  Procedure Laterality Date   COLONOSCOPY  2004   RMR/NUR INFLAMMATORY POLYP, POST POLYPECTOMY BLEED   COLONOSCOPY  oct 2011 SLF nvd-WEIGHT LOSS   NL TI, [place]/DC TICS   ESOPHAGOGASTRODUODENOSCOPY  2006   W/DILATION W/RMR   KNEE ARTHROSCOPY     RIGHT FOOT SURGERY     TOTAL KNEE ARTHROPLASTY Left 05/21/2015   Procedure: TOTAL KNEE ARTHROPLASTY;  Surgeon: Katherine FORBES Minerva, MD;  Location: [facility];  Service: Orthopedics;  Laterality: Left;    Home Medications:  Allergies as of 10/26/2023       Reactions   Sulfa Antibiotics Hives   Iodinated Contrast Media Rash        Medication List        Accurate as of October 26, 2023  4:20 PM. If you have any questions, ask your nurse or doctor.          amLODipine  10 MG tablet Commonly known as: NORVASC  Take 1 tablet (10 mg total) by mouth daily.   aspirin  81 MG tablet Take 81 mg by mouth every morning.   atorvastatin  20 MG tablet Commonly known as: LIPITOR Take 1 tablet (20 mg total) by mouth daily.   diphenhydrAMINE  50 MG  tablet Commonly known as: BENADRYL  Take one tablet 1 hour before scheduled CT to reduce risk of allergic reaction.   divalproex  250 MG 24 hr tablet Commonly known as: DEPAKOTE  ER TAKE THREE (3) TABLETS BY MOUTH AT NIGHT   DULoxetine  60 MG capsule Commonly known as: CYMBALTA  TAKE ONE CAPSULE BY MOUTH DAILY   gabapentin  300 MG capsule Commonly known as: NEURONTIN  TAKE 1 OR 2 CAPSULES BY MOUTH UP TO THREE TIMES DAILY FOR BURNING PAIN IN FEET.   levothyroxine 25 MCG tablet Commonly known as: SYNTHROID Take 25 mcg by mouth every morning.   metoprolol  tartrate 25 MG tablet Commonly known as: LOPRESSOR  Take 25 mg by mouth 2 (two) times daily.   omeprazole  20 MG capsule Commonly known as: PRILOSEC Take 1 capsule (20 mg total) by mouth daily.   polyethylene glycol 17 g packet Commonly known as: MIRALAX  / GLYCOLAX  Take 17 g by mouth daily.   Trospium  Chloride 60 MG Cp24 Take 1 capsule (60 mg total) by mouth daily.        Allergies:  Allergies  Allergen Reactions   Sulfa Antibiotics Hives   Iodinated Contrast Media Rash    Family History: Family History  Problem Relation Age of Onset   Cancer Father        stomach   Alzheimer's disease Mother  Dementia Mother    Alcohol abuse Brother    Hypertension Other    Cancer Other    Colon polyps Neg Hx    Colon cancer Neg Hx     Social History:  reports that she has never smoked. Her smokeless tobacco use includes snuff. She reports that she does not drink alcohol and does not use drugs.  ROS: All other review of systems were reviewed and are negative except what is noted above in HPI  Physical Exam: There were no vitals taken for this visit.  Constitutional:  Alert and oriented, No acute distress. HEENT: [place] AT, moist mucus membranes.  Trachea midline, no masses. Cardiovascular: No clubbing, cyanosis, or edema. Respiratory: Normal respiratory effort, no increased work of breathing. GI: Abdomen is soft, nontender,  nondistended, no abdominal masses GU: No CVA tenderness.  Lymph: No cervical or inguinal lymphadenopathy. Skin: No rashes, bruises or suspicious lesions. Neurologic: Grossly intact, no focal deficits, moving all 4 extremities. Psychiatric: Normal mood and affect.  Laboratory Data: Lab Results  Component Value Date   WBC 8.1 08/08/2023   HGB 12.8 08/08/2023   HCT 42.0 08/08/2023   MCV 78 (L) 08/08/2023   PLT 154 08/08/2023    Lab Results  Component Value Date   CREATININE 1.12 (H) 08/08/2023    No results found for: PSA  No results found for: TESTOSTERONE  Lab Results  Component Value Date   HGBA1C 6.0 (H) 02/09/2021    Urinalysis    Component Value Date/Time   COLORURINE DARK YELLOW 02/21/2020 1136   APPEARANCEUR Clear 09/07/2023 1219   LABSPEC 1.032 02/21/2020 1136   PHURINE 6.0 02/21/2020 1136   GLUCOSEU Negative 09/07/2023 1219   HGBUR NEGATIVE 02/21/2020 1136   BILIRUBINUR CANCELED 09/07/2023 1219   KETONESUR TRACE (A) 02/21/2020 1136   PROTEINUR 1+ (A) 09/07/2023 1219   PROTEINUR TRACE (A) 02/21/2020 1136   NITRITE Negative 09/07/2023 1219   NITRITE NEGATIVE 05/14/2016 1945   LEUKOCYTESUR 1+ (A) 09/07/2023 1219    Lab Results  Component Value Date   LABMICR See below: 09/07/2023   WBCUA 11-30 (A) 09/07/2023   LABEPIT >10 (A) 09/07/2023   BACTERIA Many (A) 09/07/2023    Pertinent Imaging:  Results for orders placed during the hospital encounter of 09/24/08  DG Abd 1 View  Narrative Clinical Data: Left renal calculus, follow-up  ABDOMEN - 1 VIEW  Comparison: 03/27/2008  Findings: Diffuse bony demineralization. Levoconvex scoliosis lumbar spine. No definite left renal calculus identified. Small pelvic phleboliths stable. Slight prominent stool in proximal half of colon. Bowel gas pattern otherwise normal. No definite urinary tract calcification identified.  IMPRESSION: No definite urinary tract calcification seen.  Provider:  Dwayne Goltz  No results found for this or any previous visit.  No results found for this or any previous visit.  No results found for this or any previous visit.  No results found for this or any previous visit.  No results found for this or any previous visit.  No results found for this or any previous visit.  No results found for this or any previous visit.   Assessment & Plan:    1. Urinary incontinence, unspecified type (Primary) Continue sanctura  60mg  daily -She is to call her PCP to schedule a sleep study due to concern for OSA.  - Urinalysis, Routine w reflex microscopic   No follow-ups on file.  Belvie Clara, MD  [facility] [place]

## 2023-10-26 NOTE — Patient Instructions (Signed)

## 2023-10-27 LAB — URINALYSIS, ROUTINE W REFLEX MICROSCOPIC
Bilirubin, UA: NEGATIVE
Glucose, UA: NEGATIVE
Ketones, UA: NEGATIVE
Nitrite, UA: NEGATIVE
Protein,UA: NEGATIVE
RBC, UA: NEGATIVE
Specific Gravity, UA: 1.015 (ref 1.005–1.030)
Urobilinogen, Ur: 0.2 mg/dL (ref 0.2–1.0)
pH, UA: 7 (ref 5.0–7.5)

## 2023-10-27 LAB — MICROSCOPIC EXAMINATION: Bacteria, UA: NONE SEEN

## 2023-10-31 ENCOUNTER — Ambulatory Visit (HOSPITAL_COMMUNITY)
Admission: RE | Admit: 2023-10-31 | Discharge: 2023-10-31 | Disposition: A | Discharge status: Home / Self Care | Payer: Medicare HMO | Source: Ambulatory Visit | Attending: Adult Health | Admitting: Adult Health

## 2023-10-31 ENCOUNTER — Telehealth: Payer: Self-pay

## 2023-10-31 DIAGNOSIS — M85851 Other specified disorders of bone density and structure, right thigh: Secondary | ICD-10-CM | POA: Insufficient documentation

## 2023-10-31 DIAGNOSIS — Z780 Asymptomatic menopausal state: Secondary | ICD-10-CM | POA: Diagnosis not present

## 2023-10-31 DIAGNOSIS — Z13820 Encounter for screening for osteoporosis: Secondary | ICD-10-CM | POA: Insufficient documentation

## 2023-10-31 NOTE — Telephone Encounter (Signed)
 Spoke with patient regarding her ultrasound results. Pt informed that results are normal.

## 2023-10-31 NOTE — Telephone Encounter (Addendum)
 Patient would like for someone to call her concerning her results from the ultrasound she had a few weeks ago.

## 2023-11-28 ENCOUNTER — Telehealth: Payer: Self-pay

## 2023-11-28 NOTE — Telephone Encounter (Signed)
 Called requesting Dr. Ronne Binning sign a at home urine testing requisition. Per Dr. Ronne Binning he does no want to authorize test.

## 2023-11-30 NOTE — Telephone Encounter (Signed)
 Lab calling again to MD MCKenzie to sign lab requisition Lab (jeff) advised that Md would not be signing the documents and they can reach out to the Pt PCP for further needs

## 2024-04-27 ENCOUNTER — Ambulatory Visit: Payer: Medicare HMO | Admitting: Urology

## 2024-06-15 ENCOUNTER — Ambulatory Visit: Admitting: Urology

## 2024-06-15 VITALS — BP 144/63 | HR 58

## 2024-06-15 DIAGNOSIS — R32 Unspecified urinary incontinence: Secondary | ICD-10-CM

## 2024-06-15 DIAGNOSIS — R351 Nocturia: Secondary | ICD-10-CM

## 2024-06-15 MED ORDER — TROSPIUM CHLORIDE ER 60 MG PO CP24: 1.0000 | ORAL_CAPSULE | Freq: Every day | ORAL | 11 refills | Status: AC

## 2024-06-15 MED ORDER — MIRABEGRON ER 50 MG PO TB24: 50.0000 mg | ORAL_TABLET | Freq: Every day | ORAL | 11 refills | Status: AC

## 2024-06-15 NOTE — Progress Notes (Signed)
 06/15/2024 12:02 PM   MACKIE GOON 30-Apr-1935 [id number]  Referring provider: Benjamin Raina Elizabeth, NP [address] [place],  [place] [address]  Followup urge incontinence   HPI: Ms Katherine Joseph is a [phone] here for followup for urge incontinence and nocturia. She did not have a sleep study. She in on sanctura  60mg  daily. She has nocturia 3-4x which are larger volumes. She wears a pad at night but not during the day. Urine stream is strong. No straining to urinate. No issues with UTI   PMH: Past Medical History:  Diagnosis Date   Arthritis    Fluttering muscles SEP 2009   MBS/BASW NL   GERD (gastroesophageal reflux disease)    Hemorrhoids, internal OCT 2011   Hereditary and idiopathic peripheral neuropathy 10/04/2016   Hyperlipidemia    Hypertension    IBS (irritable bowel syndrome)    Prediabetes     Surgical History: Past Surgical History:  Procedure Laterality Date   COLONOSCOPY  2004   RMR/NUR INFLAMMATORY POLYP, POST POLYPECTOMY BLEED   COLONOSCOPY  oct 2011 [facility] nvd-WEIGHT LOSS   NL TI, [place]/DC TICS   ESOPHAGOGASTRODUODENOSCOPY  2006   W/DILATION W/RMR   KNEE ARTHROSCOPY     RIGHT FOOT SURGERY     TOTAL KNEE ARTHROPLASTY Left 05/21/2015   Procedure: TOTAL KNEE ARTHROPLASTY;  Surgeon: Taft FORBES Minerva, MD;  Location: [facility];  Service: Orthopedics;  Laterality: Left;    Home Medications:  Allergies as of 06/15/2024       Reactions   Sulfa Antibiotics Hives   Iodinated Contrast Media Rash        Medication List        Accurate as of June 15, 2024 12:02 PM. If you have any questions, ask your nurse or doctor.          amLODipine  10 MG tablet Commonly known as: NORVASC  Take 1 tablet (10 mg total) by mouth daily.   aspirin  81 MG tablet Take 81 mg by mouth every morning.   atorvastatin  20 MG tablet Commonly known as: LIPITOR Take 1 tablet (20 mg total) by mouth daily.   diphenhydrAMINE  50 MG tablet Commonly known as:  BENADRYL  Take one tablet 1 hour before scheduled CT to reduce risk of allergic reaction.   divalproex  250 MG 24 hr tablet Commonly known as: DEPAKOTE  ER TAKE THREE (3) TABLETS BY MOUTH AT NIGHT   DULoxetine  60 MG capsule Commonly known as: CYMBALTA  TAKE ONE CAPSULE BY MOUTH DAILY   gabapentin  300 MG capsule Commonly known as: NEURONTIN  TAKE 1 OR 2 CAPSULES BY MOUTH UP TO THREE TIMES DAILY FOR BURNING PAIN IN FEET.   levothyroxine 25 MCG tablet Commonly known as: SYNTHROID Take 25 mcg by mouth every morning.   metoprolol  tartrate 25 MG tablet Commonly known as: LOPRESSOR  Take 25 mg by mouth 2 (two) times daily.   omeprazole  20 MG capsule Commonly known as: PRILOSEC Take 1 capsule (20 mg total) by mouth daily.   polyethylene glycol 17 g packet Commonly known as: MIRALAX  / GLYCOLAX  Take 17 g by mouth daily.   Trospium  Chloride 60 MG Cp24 Take 1 capsule (60 mg total) by mouth daily.        Allergies:  Allergies  Allergen Reactions   Sulfa Antibiotics Hives   Iodinated Contrast Media Rash    Family History: Family History  Problem Relation Age of Onset   Cancer Father        stomach   Alzheimer's disease Mother  Dementia Mother    Alcohol abuse Brother    Hypertension Other    Cancer Other    Colon polyps Neg Hx    Colon cancer Neg Hx     Social History:  reports that she has never smoked. Her smokeless tobacco use includes snuff. She reports that she does not drink alcohol and does not use drugs.  ROS: All other review of systems were reviewed and are negative except what is noted above in HPI  Physical Exam: BP (!) 144/63   Pulse (!) 58   Constitutional:  Alert and oriented, No acute distress. HEENT: [place] AT, moist mucus membranes.  Trachea midline, no masses. Cardiovascular: No clubbing, cyanosis, or edema. Respiratory: Normal respiratory effort, no increased work of breathing. GI: Abdomen is soft, nontender, nondistended, no abdominal masses GU:  No CVA tenderness.  Lymph: No cervical or inguinal lymphadenopathy. Skin: No rashes, bruises or suspicious lesions. Neurologic: Grossly intact, no focal deficits, moving all 4 extremities. Psychiatric: Normal mood and affect.  Laboratory Data: Lab Results  Component Value Date   WBC 8.1 08/08/2023   HGB 12.8 08/08/2023   HCT 42.0 08/08/2023   MCV 78 (L) 08/08/2023   PLT 154 08/08/2023    Lab Results  Component Value Date   CREATININE 1.12 (H) 08/08/2023    No results found for: PSA  No results found for: TESTOSTERONE  Lab Results  Component Value Date   HGBA1C 6.0 (H) 02/09/2021    Urinalysis    Component Value Date/Time   COLORURINE DARK YELLOW 02/21/2020 1136   APPEARANCEUR Clear 10/26/2023 1615   LABSPEC 1.032 02/21/2020 1136   PHURINE 6.0 02/21/2020 1136   GLUCOSEU Negative 10/26/2023 1615   HGBUR NEGATIVE 02/21/2020 1136   BILIRUBINUR Negative 10/26/2023 1615   KETONESUR TRACE (A) 02/21/2020 1136   PROTEINUR Negative 10/26/2023 1615   PROTEINUR TRACE (A) 02/21/2020 1136   NITRITE Negative 10/26/2023 1615   NITRITE NEGATIVE 05/14/2016 1945   LEUKOCYTESUR Trace (A) 10/26/2023 1615    Lab Results  Component Value Date   LABMICR See below: 10/26/2023   WBCUA 6-10 (A) 10/26/2023   LABEPIT 0-10 10/26/2023   BACTERIA None seen 10/26/2023    Pertinent Imaging:  Results for orders placed during the hospital encounter of 09/24/08  DG Abd 1 View  Narrative Clinical Data: Left renal calculus, follow-up  ABDOMEN - 1 VIEW  Comparison: 03/27/2008  Findings: Diffuse bony demineralization. Levoconvex scoliosis lumbar spine. No definite left renal calculus identified. Small pelvic phleboliths stable. Slight prominent stool in proximal half of colon. Bowel gas pattern otherwise normal. No definite urinary tract calcification identified.  IMPRESSION: No definite urinary tract calcification seen.  Provider: Dwayne Goltz  No results found for  this or any previous visit.  No results found for this or any previous visit.  No results found for this or any previous visit.  No results found for this or any previous visit.  No results found for this or any previous visit.  No results found for this or any previous visit.  No results found for this or any previous visit.   Assessment & Plan:    1. Nocturia (Primary) Start mirabegron  50mg    2. Urinary incontinence, unspecified type Continue sanctura  60mg  daily   No follow-ups on file.  Belvie Clara, MD  [facility] [place]

## 2024-06-19 ENCOUNTER — Encounter: Payer: Self-pay | Admitting: Urology

## 2024-06-19 NOTE — Patient Instructions (Signed)

## 2024-06-29 ENCOUNTER — Telehealth: Payer: Self-pay

## 2024-06-29 NOTE — Telephone Encounter (Signed)
 Patient left message wanting to schedule an appointment with Dr. Margrette. I returned her call and had to leave a message for her to call the office.

## 2024-07-09 ENCOUNTER — Ambulatory Visit: Admitting: Orthopedic Surgery

## 2024-07-09 DIAGNOSIS — M1711 Unilateral primary osteoarthritis, right knee: Secondary | ICD-10-CM

## 2024-07-09 DIAGNOSIS — Z96652 Presence of left artificial knee joint: Secondary | ICD-10-CM

## 2024-07-09 DIAGNOSIS — M2550 Pain in unspecified joint: Secondary | ICD-10-CM

## 2024-07-09 MED ORDER — PREDNISONE 10 MG PO TABS: ORAL_TABLET | ORAL | 0 refills | Status: AC

## 2024-07-09 NOTE — Progress Notes (Signed)
 Patient: Katherine Joseph           Date of Birth: December 15, 1934           MRN: [id number] Visit Date: 07/09/2024 Requested by: Benjamin Raina Elizabeth, NP [address] [place],  [place] [address] PCP: Benjamin Raina Elizabeth, NP   Chief Complaint  Patient presents with   Knee Pain    Bilateral-knees are not good Left TKA 05/21/15   Encounter Diagnosis  Name Primary?   Arthralgia, unspecified joint Yes    Plan:  89 year old female with left total knee arthroplasty and right knee pain from arthritis but generalized arthralgias does not want to have surgery on the right knee and it may be too late although her health has been maintained  I want her to try 7-day on 7-day off low-dose prednisone  10 mg  I do not think opioids are an answer   Chief Complaint  Patient presents with   Knee Pain    Bilateral-knees are not good Left TKA 05/21/15    89 year old female multiple joints are hurting including lower back right knee shoulders status post left total knee 2016  Knee Pain     There is no height or weight on file to calculate BMI.   Problem list, medical hx, medications and allergies reviewed   ROS  No numbness or tingling in the legs   Allergies  Allergen Reactions   Sulfa Antibiotics Hives   Iodinated Contrast Media Rash    There were no vitals taken for this visit.   Physical exam: Physical Exam Vitals and nursing note reviewed.  Constitutional:      Appearance: Normal appearance.  HENT:     Head: Normocephalic and atraumatic.  Eyes:     General: No scleral icterus.       Right eye: No discharge.        Left eye: No discharge.     Extraocular Movements: Extraocular movements intact.     Conjunctiva/sclera: Conjunctivae normal.     Pupils: Pupils are equal, round, and reactive to light.  Cardiovascular:     Rate and Rhythm: Normal rate.     Pulses: Normal pulses.  Skin:    General: Skin is warm and dry.     Capillary Refill:  Capillary refill takes less than 2 seconds.  Neurological:     General: No focal deficit present.     Mental Status: She is alert and oriented to person, place, and time.  Psychiatric:        Mood and Affect: Mood normal.        Behavior: Behavior normal.        Thought Content: Thought content normal.        Judgment: Judgment normal.     Ortho Exam  MSK:  Left knee Normal incision anterior portion left knee consistent with knee replacement Range of motion generally 0 to 115 degrees Knee seems stable  Right knee lateral compartment arthrosis decreased range of motion painful joint to palpation no effusion  Data reviewed:   Image(s) reviewed with personal interpretation:  No new images  Assessment and plan:  Encounter Diagnosis  Name Primary?   Arthralgia, unspecified joint Yes     Meds ordered this encounter  Medications   predniSONE  (DELTASONE ) 10 MG tablet    Sig: 10 mg daily for 7 days then 7 days off    Dispense:  90 tablet    Refill:  0    Procedures:  no

## 2024-07-09 NOTE — Progress Notes (Signed)
    07/09/2024   Chief Complaint  Patient presents with   Knee Pain    Bilateral-knees are not good     No diagnosis found.  What pharmacy do you use ? ___[facility]________  [place]

## 2024-09-11 ENCOUNTER — Encounter: Payer: Self-pay | Admitting: Gastroenterology

## 2024-09-19 ENCOUNTER — Other Ambulatory Visit (HOSPITAL_COMMUNITY): Payer: Self-pay | Admitting: Adult Health

## 2024-09-19 DIAGNOSIS — Z1231 Encounter for screening mammogram for malignant neoplasm of breast: Secondary | ICD-10-CM

## 2024-10-03 ENCOUNTER — Ambulatory Visit (HOSPITAL_COMMUNITY)
Admission: RE | Admit: 2024-10-03 | Discharge: 2024-10-03 | Disposition: A | Discharge status: Home / Self Care | Source: Ambulatory Visit | Attending: Adult Health | Admitting: Adult Health

## 2024-10-03 ENCOUNTER — Encounter (HOSPITAL_COMMUNITY): Payer: Self-pay

## 2024-10-03 DIAGNOSIS — Z1231 Encounter for screening mammogram for malignant neoplasm of breast: Secondary | ICD-10-CM | POA: Insufficient documentation

## 2024-10-23 ENCOUNTER — Ambulatory Visit: Admitting: Gastroenterology

## 2024-12-21 ENCOUNTER — Ambulatory Visit: Admitting: Urology
